# Patient Record
Sex: Male | Born: 1950 | Race: Black or African American | Hispanic: No | Marital: Married | Smoking: Former smoker
Health system: Southern US, Community
[De-identification: ages and names within clinical notes are randomized; demographics above are authoritative.]

## PROBLEM LIST (undated history)

## (undated) DIAGNOSIS — R42 Dizziness and giddiness: Secondary | ICD-10-CM

## (undated) DIAGNOSIS — I10 Essential (primary) hypertension: Secondary | ICD-10-CM

## (undated) DIAGNOSIS — E119 Type 2 diabetes mellitus without complications: Secondary | ICD-10-CM

## (undated) DIAGNOSIS — I452 Bifascicular block: Secondary | ICD-10-CM

## (undated) DIAGNOSIS — E785 Hyperlipidemia, unspecified: Secondary | ICD-10-CM

## (undated) HISTORY — DX: Type 2 diabetes mellitus without complications: E11.9

## (undated) HISTORY — DX: Dizziness and giddiness: R42

## (undated) HISTORY — PX: HERNIA REPAIR: SHX51

## (undated) HISTORY — PX: PROSTATE SURGERY: SHX751

## (undated) HISTORY — DX: Essential (primary) hypertension: I10

## (undated) HISTORY — PX: SHOULDER SURGERY: SHX246

## (undated) HISTORY — DX: Hyperlipidemia, unspecified: E78.5

## (undated) HISTORY — DX: Bifascicular block: I45.2

---

## 2004-05-19 ENCOUNTER — Encounter: Admission: RE | Admit: 2004-05-19 | Discharge: 2004-05-19 | Payer: Self-pay | Admitting: General Surgery

## 2004-05-19 IMAGING — CR DG CHEST 2V
2 series · 2 of 2 positions shown · non-contrast
Comparison: none

CLINICAL DATA: Pre-op for hernia repair. 
 TWO VIEW CHEST: 
 Comparison none. 
 Heart mildly enlarged with a prominent left ventricular contour.  No congestive heart failure or active disease.

[view not recorded (1 of 2)]
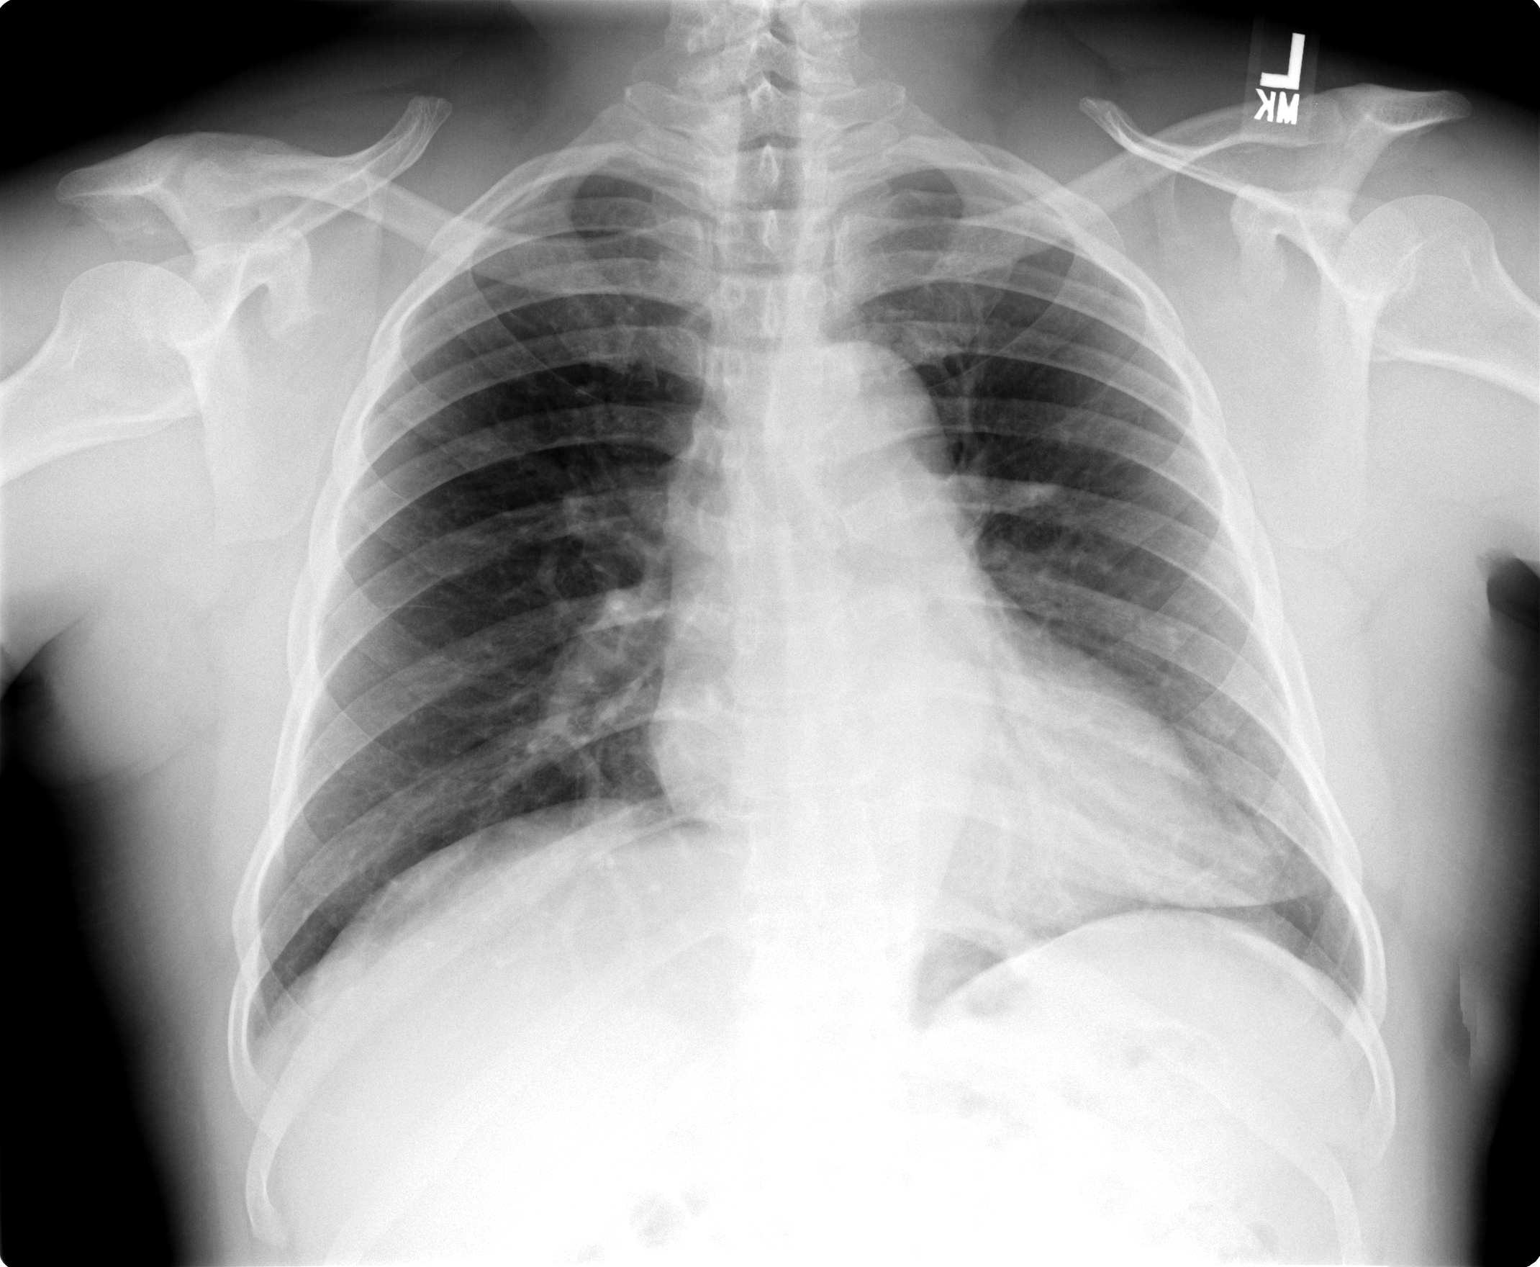

[view not recorded (2 of 2)]
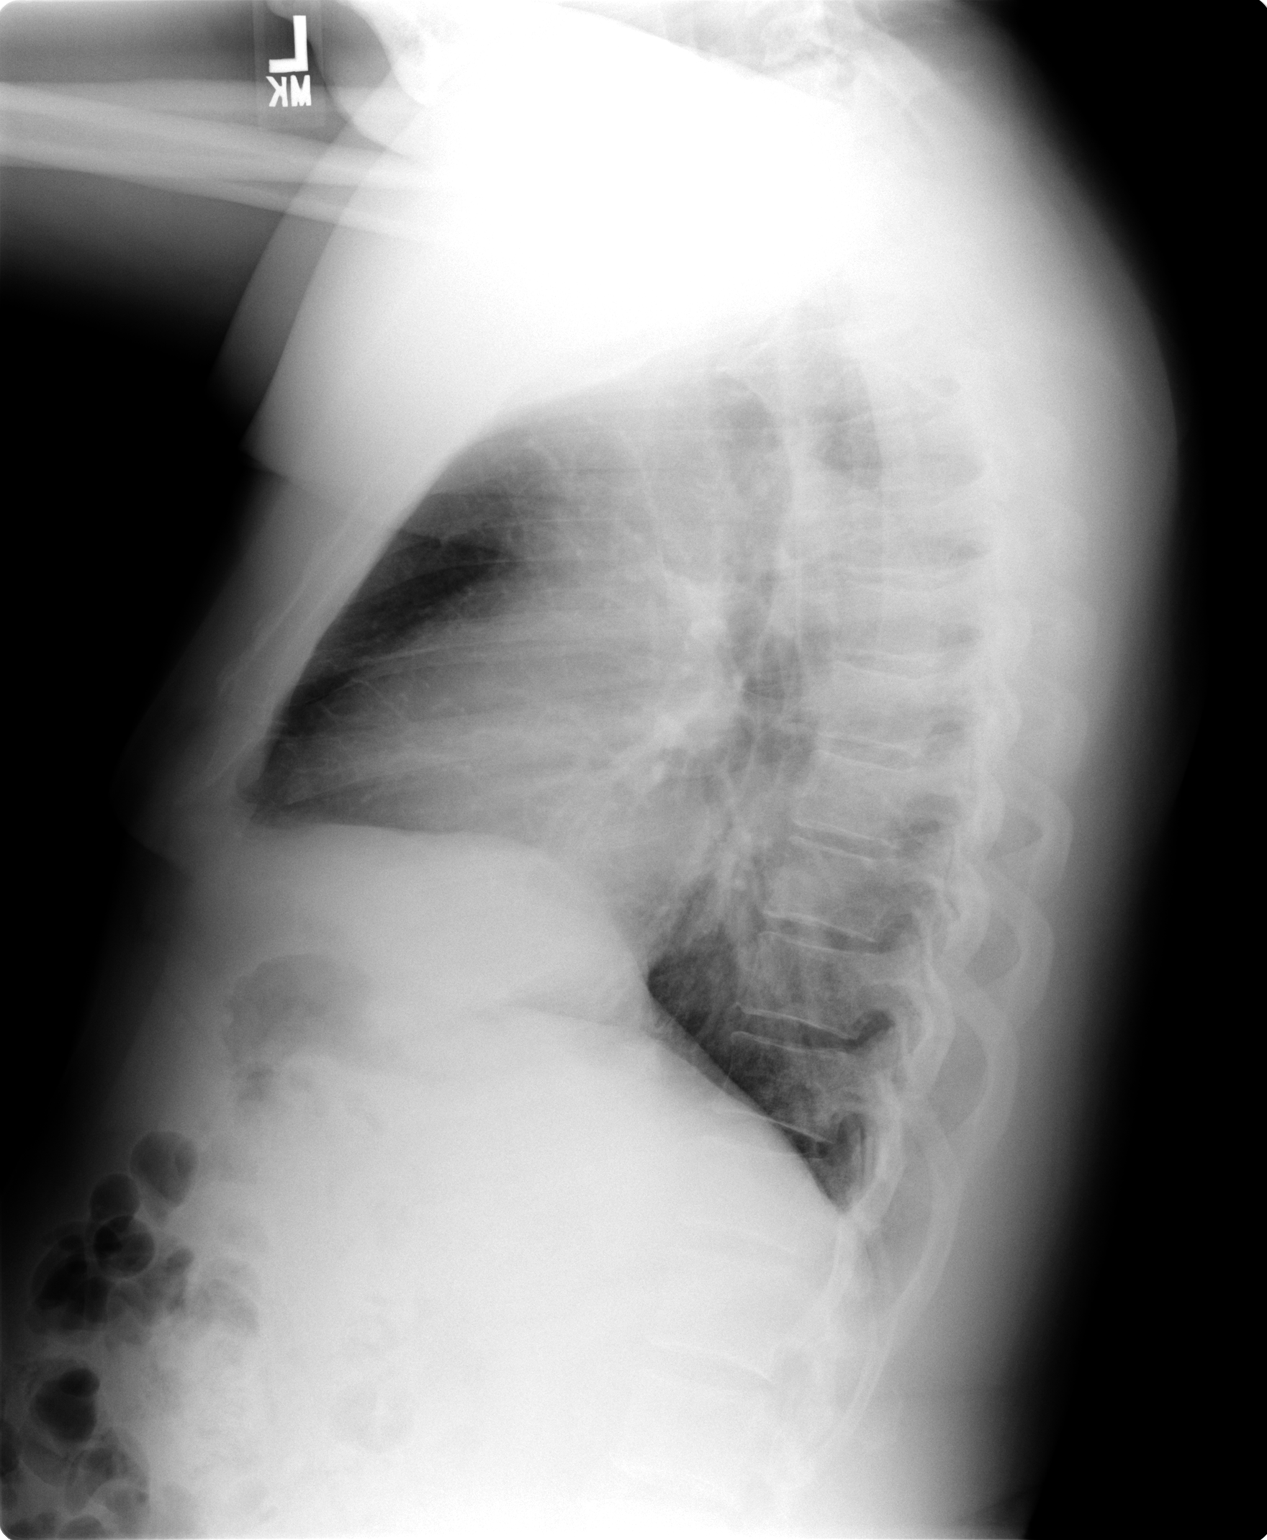

[2 of 2 positions shown; findings below may reference images not displayed]

IMPRESSION: Cardiomegaly ? no active disease.

## 2004-05-20 ENCOUNTER — Ambulatory Visit (HOSPITAL_COMMUNITY): Admission: RE | Admit: 2004-05-20 | Discharge: 2004-05-20 | Payer: Self-pay | Admitting: General Surgery

## 2004-05-20 ENCOUNTER — Ambulatory Visit (HOSPITAL_BASED_OUTPATIENT_CLINIC_OR_DEPARTMENT_OTHER): Admission: RE | Admit: 2004-05-20 | Discharge: 2004-05-20 | Payer: Self-pay | Admitting: General Surgery

## 2004-11-30 ENCOUNTER — Ambulatory Visit (HOSPITAL_COMMUNITY): Admission: RE | Admit: 2004-11-30 | Discharge: 2004-11-30 | Payer: Self-pay | Admitting: Family Medicine

## 2004-11-30 IMAGING — US US SCROTUM
1 series · 14 of 25 positions shown · non-contrast
Comparison: None.

CLINICAL DATA: Right-sided testicular enlargement and pain.  Inguinal hernia repair 4 months ago.
 SCROTAL ULTRASOUND/DOPPLER ULTRASOUND OF THE TESTICLES:
TECHNIQUE: Complete ultrasound examination of the testicles, epididymis, and other scrotal structures was performed. Color and duplex Doppler ultrasound was utilized to evaluate blood flow to the testicles.

[Series 1: unknown · 0.09mm/px · 14 of 66 slices shown]
[im 1/66]
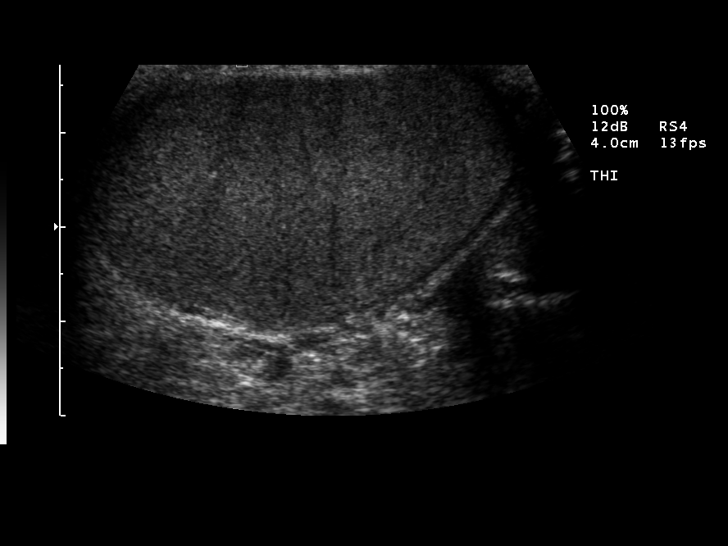
[im 6/66]
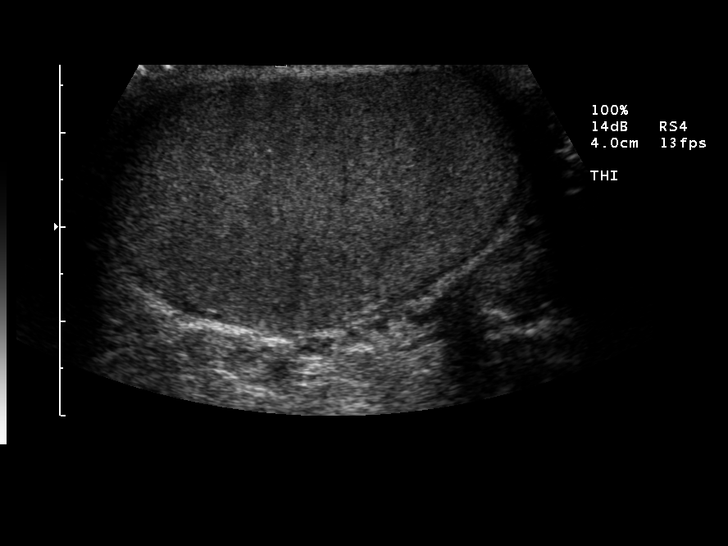
[im 11/66]
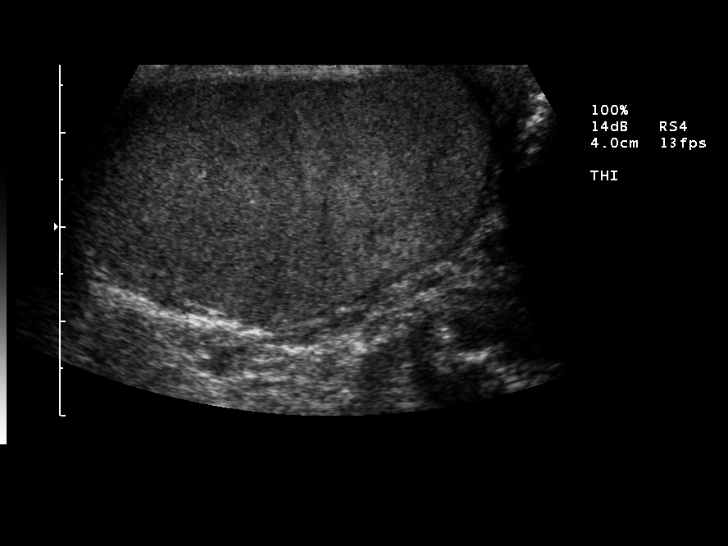
[im 17/66]
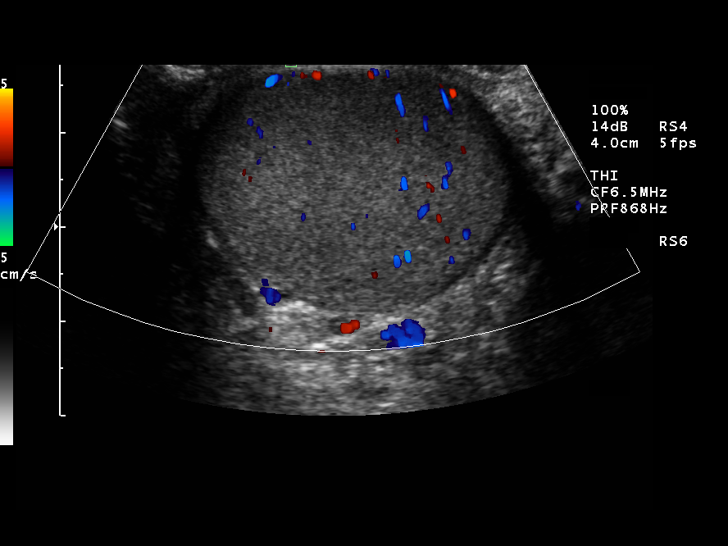
[im 22/66]
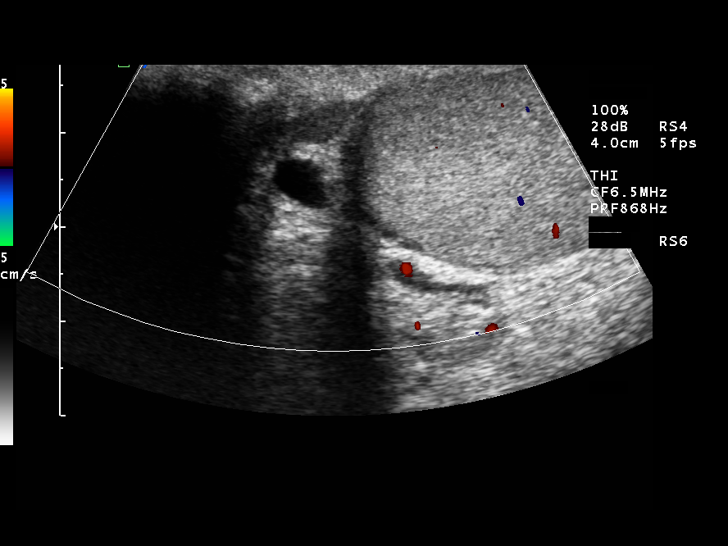
[im 25/66]
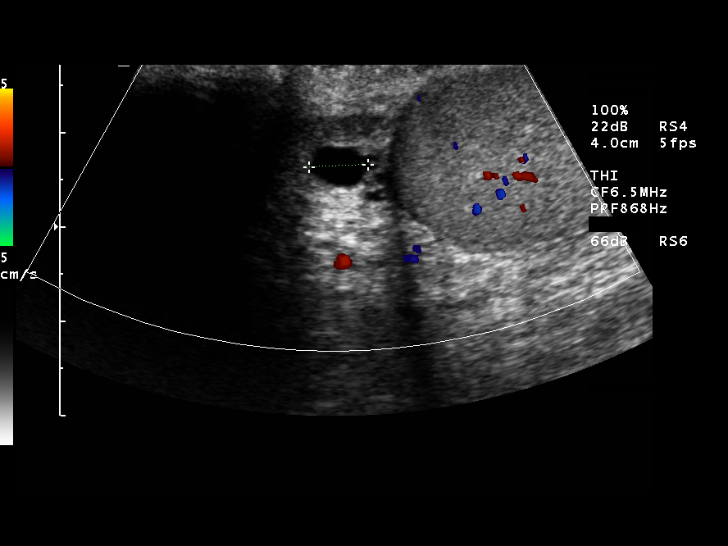
[im 30/66]
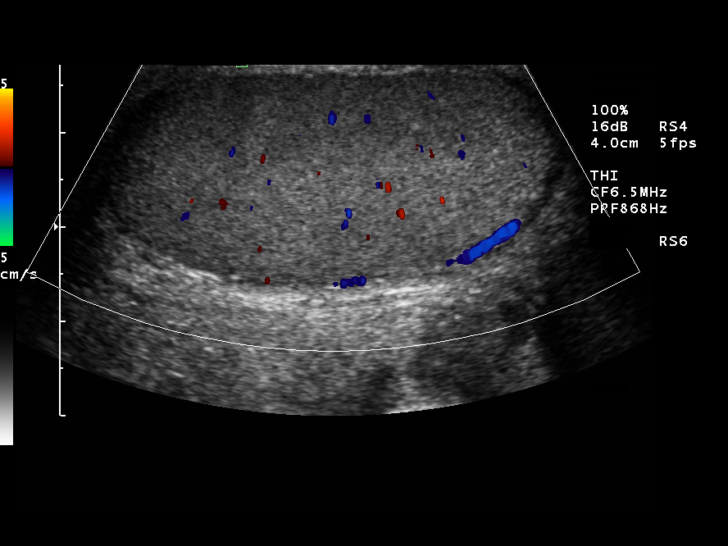
[im 36/66]
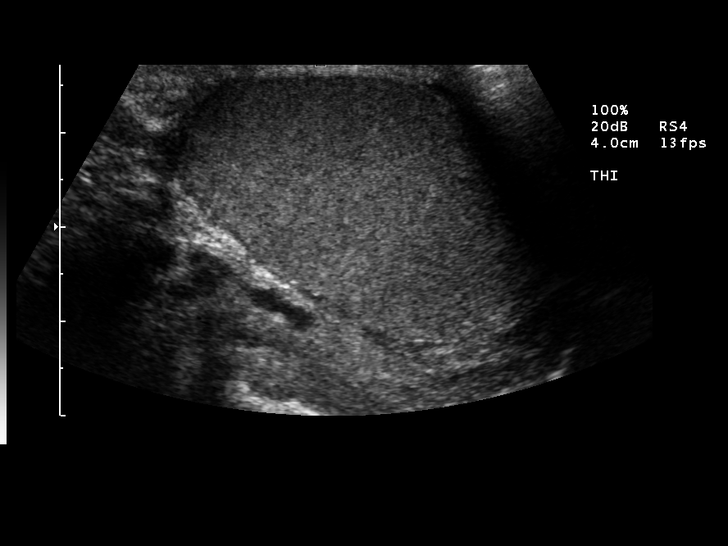
[im 41/66]
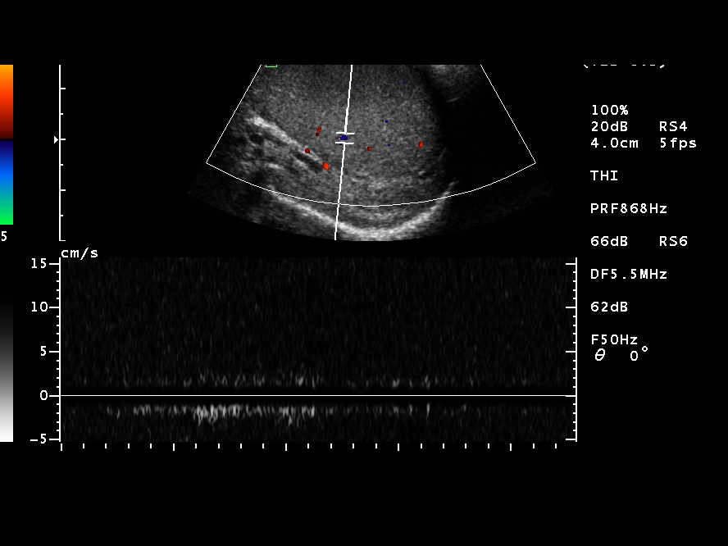
[im 44/66]
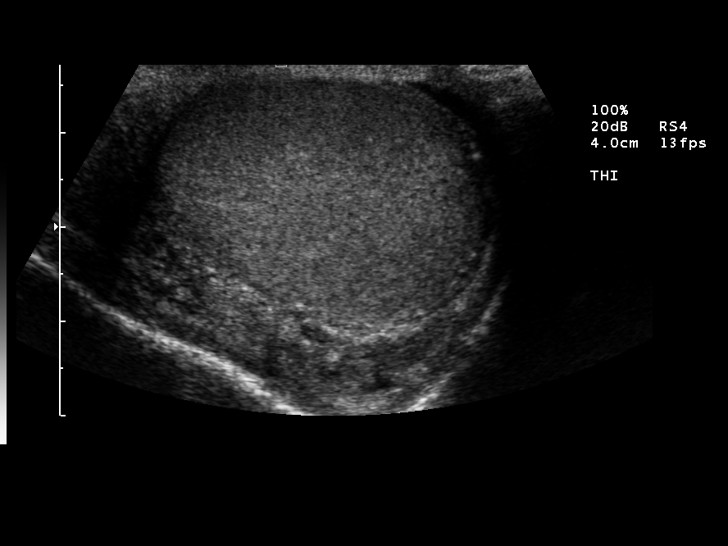
[im 49/66]
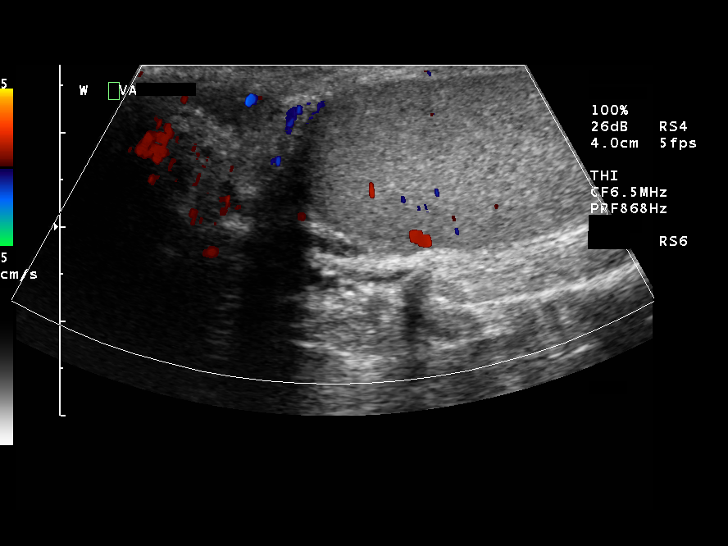
[im 55/66]
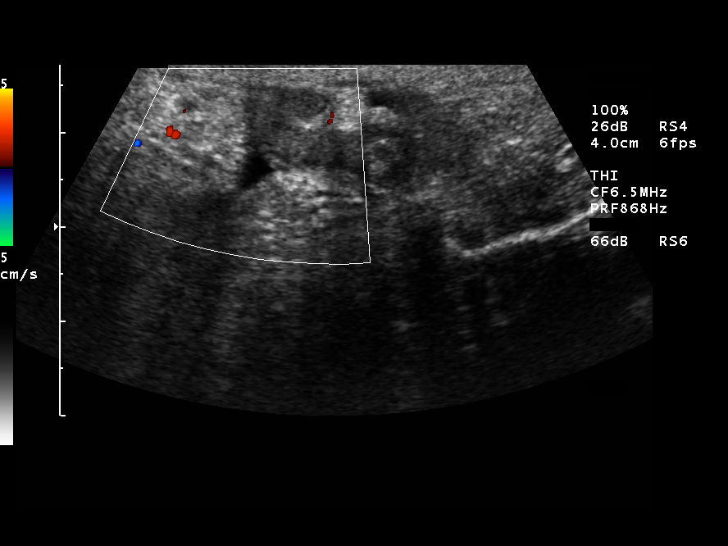
[im 60/66]
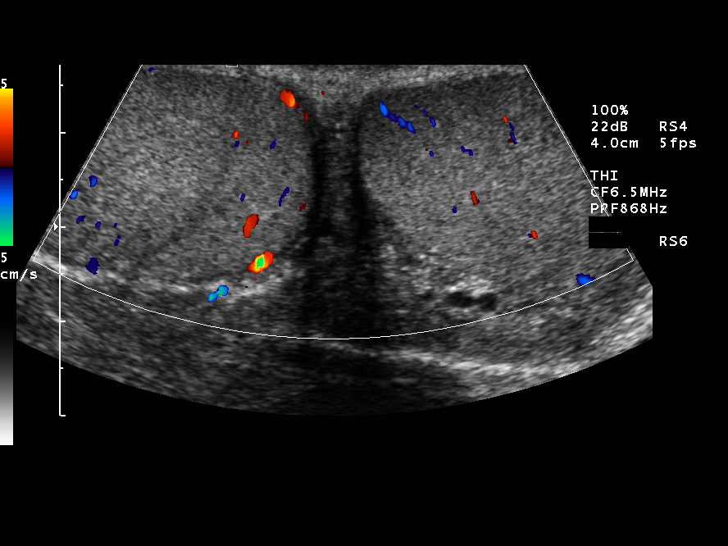
[im 66/66]
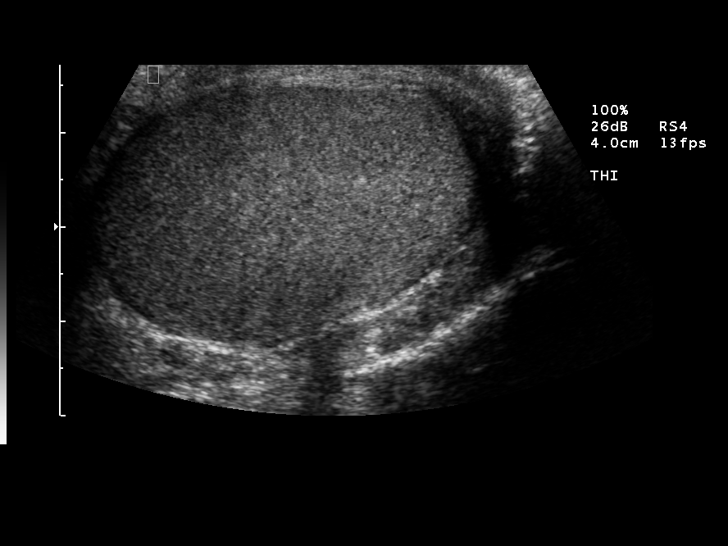

[14 of 25 positions shown; findings below may reference images not displayed]

FINDINGS: Left testis measures 4.6 x 2.7 x 3.3 cm.  Normal arterial and venous flow is demonstrated.  Right epididymis measures 1.6 x 0.9 x 1.1 cm. two epididymal cysts vs spermatoceles are identified on the right measuring approximately 5 mm each.
 Left testis measures 4.1 x 2.5 x 3.5 cm.  Trace left-sided hydrocele.  
 Left epididymis measures 1.3 x 1.0 x 1.0 cm.  Arterial and venous flow is seen to the left testis.
IMPRESSION: 1.  Two small right-sided epididymal cysts vs spermatoceles.
 2.  Otherwise normal scrotal ultrasound as described.

## 2005-12-18 ENCOUNTER — Encounter: Admission: RE | Admit: 2005-12-18 | Discharge: 2005-12-18 | Payer: Self-pay | Admitting: General Surgery

## 2005-12-18 IMAGING — CR DG CHEST 2V
2 series · 2 of 2 positions shown · non-contrast
Comparison: 05/19/04.

CLINICAL DATA: Pre-op work up for bilateral breast surgery for gynecomastia.  Hypertension. 
 CHEST, TWO VIEWS:

[w chest pa *]
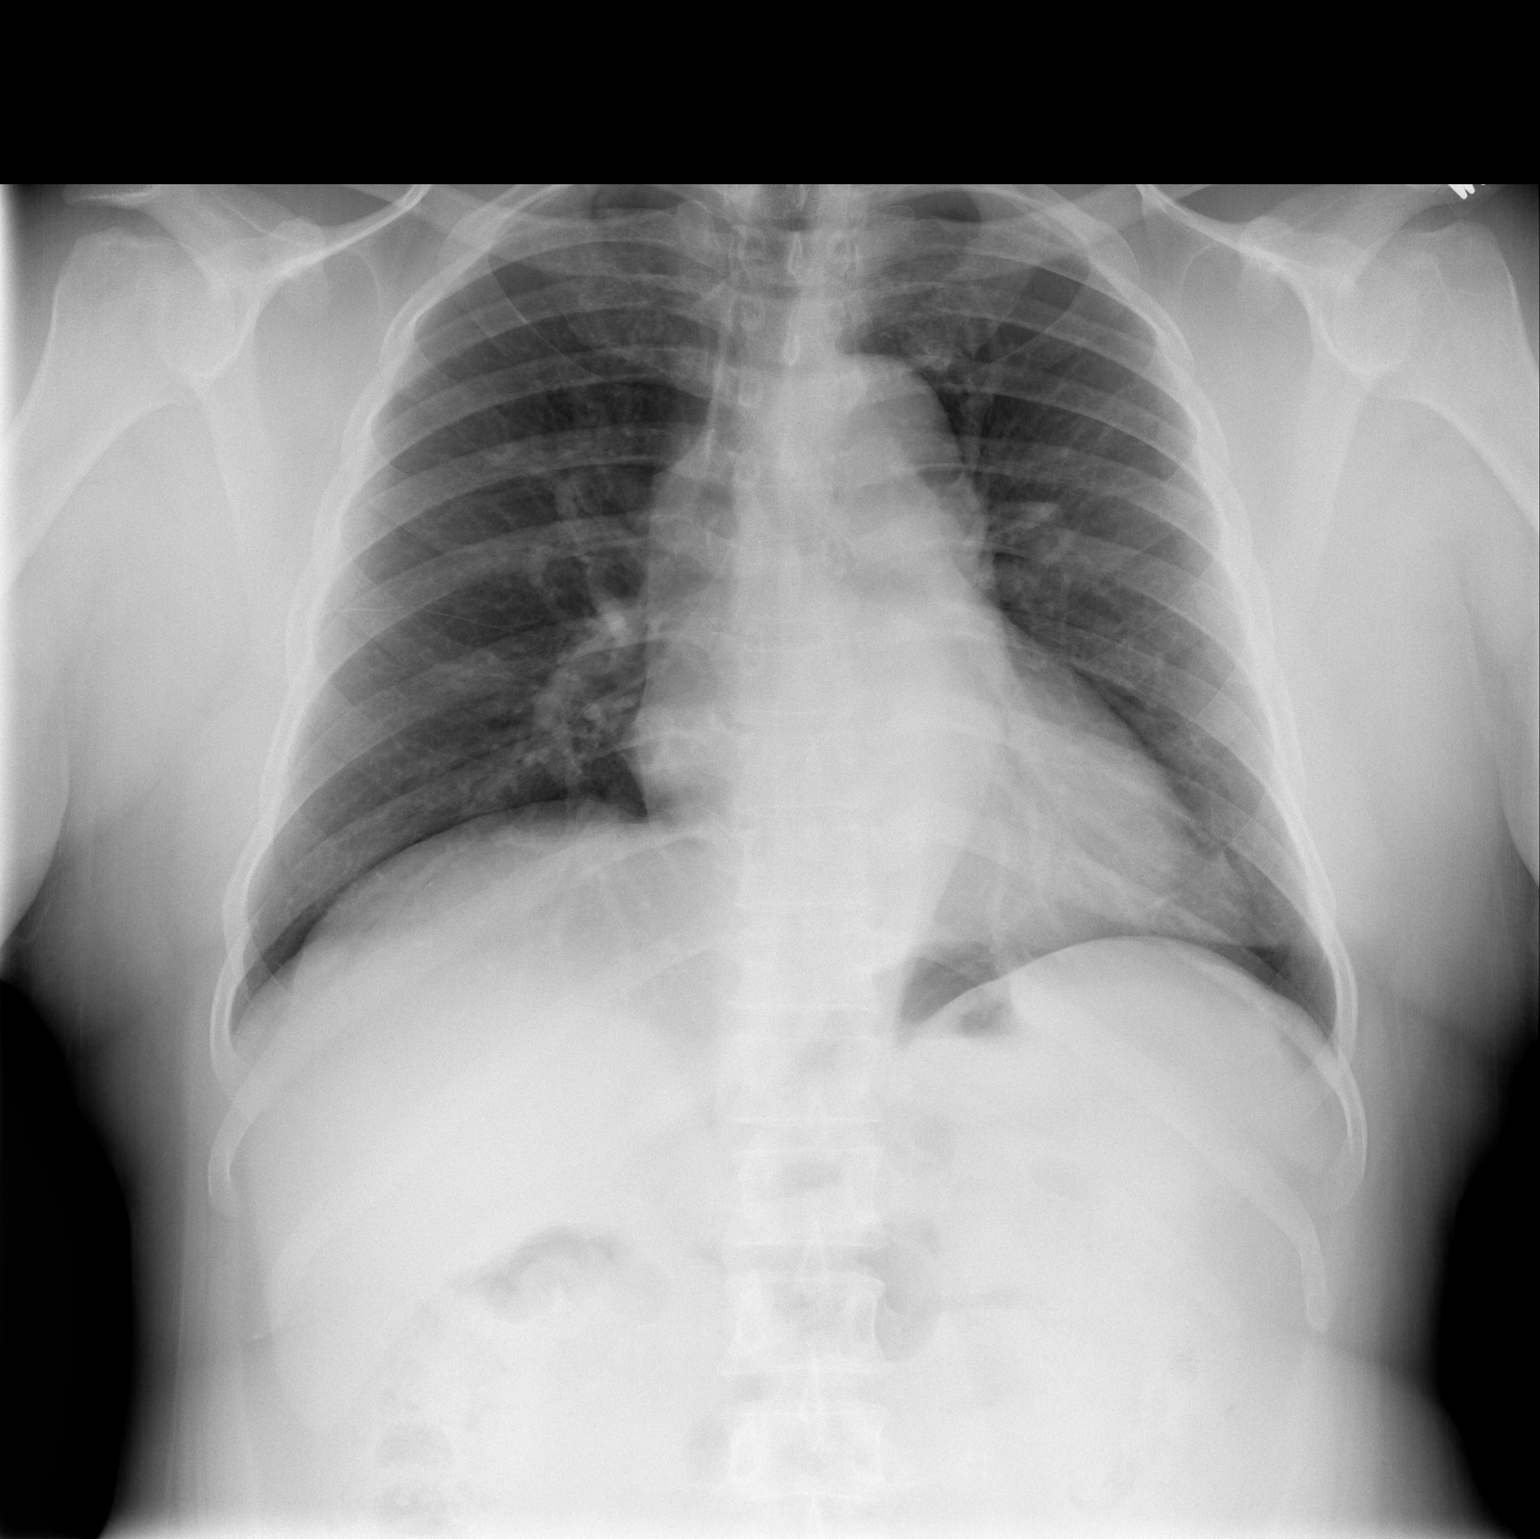

[w chest lat *]
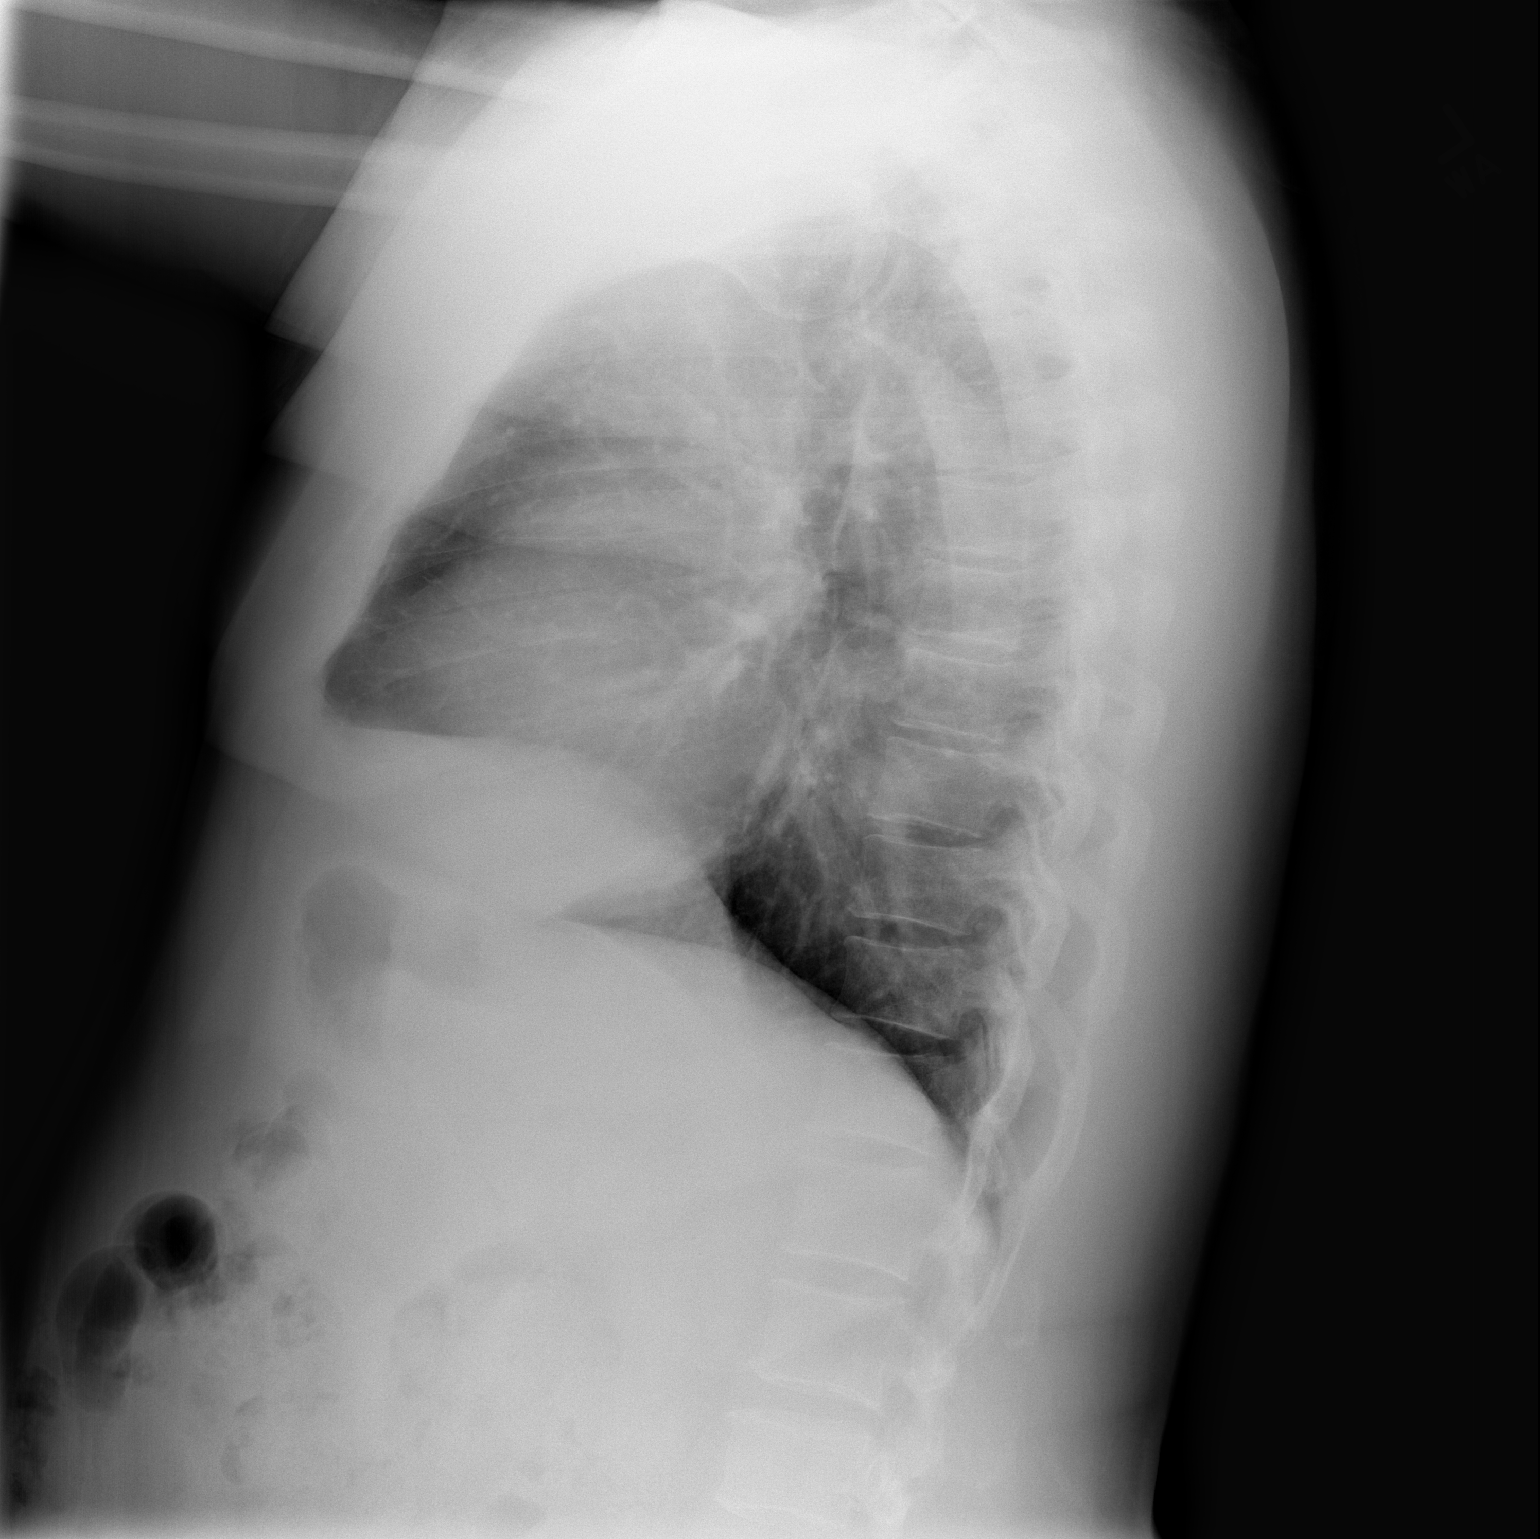

[2 of 2 positions shown; findings below may reference images not displayed]

Again appreciated is mild cardiomegaly and diffuse tortuosity and ectasia of the thoracic aorta.  No pulmonary vascular congestion or active lung process.  Intact bony thorax.
IMPRESSION: Mild cardiomegaly.  No interval change.

## 2005-12-20 ENCOUNTER — Ambulatory Visit (HOSPITAL_BASED_OUTPATIENT_CLINIC_OR_DEPARTMENT_OTHER): Admission: RE | Admit: 2005-12-20 | Discharge: 2005-12-20 | Payer: Self-pay | Admitting: General Surgery

## 2005-12-20 ENCOUNTER — Encounter (INDEPENDENT_AMBULATORY_CARE_PROVIDER_SITE_OTHER): Payer: Self-pay | Admitting: Specialist

## 2008-01-04 ENCOUNTER — Emergency Department (HOSPITAL_COMMUNITY): Admission: EM | Admit: 2008-01-04 | Discharge: 2008-01-04 | Payer: Self-pay | Admitting: Emergency Medicine

## 2008-01-04 IMAGING — CR DG CHEST 2V
3 series · 3 of 3 positions shown · non-contrast
Comparison: 12/18/2005, 05/19/2004.

CLINICAL DATA: 57-year-old male with chest pain.  No cough or
fever.

CHEST - 2 VIEW

[view not recorded (1 of 3)]
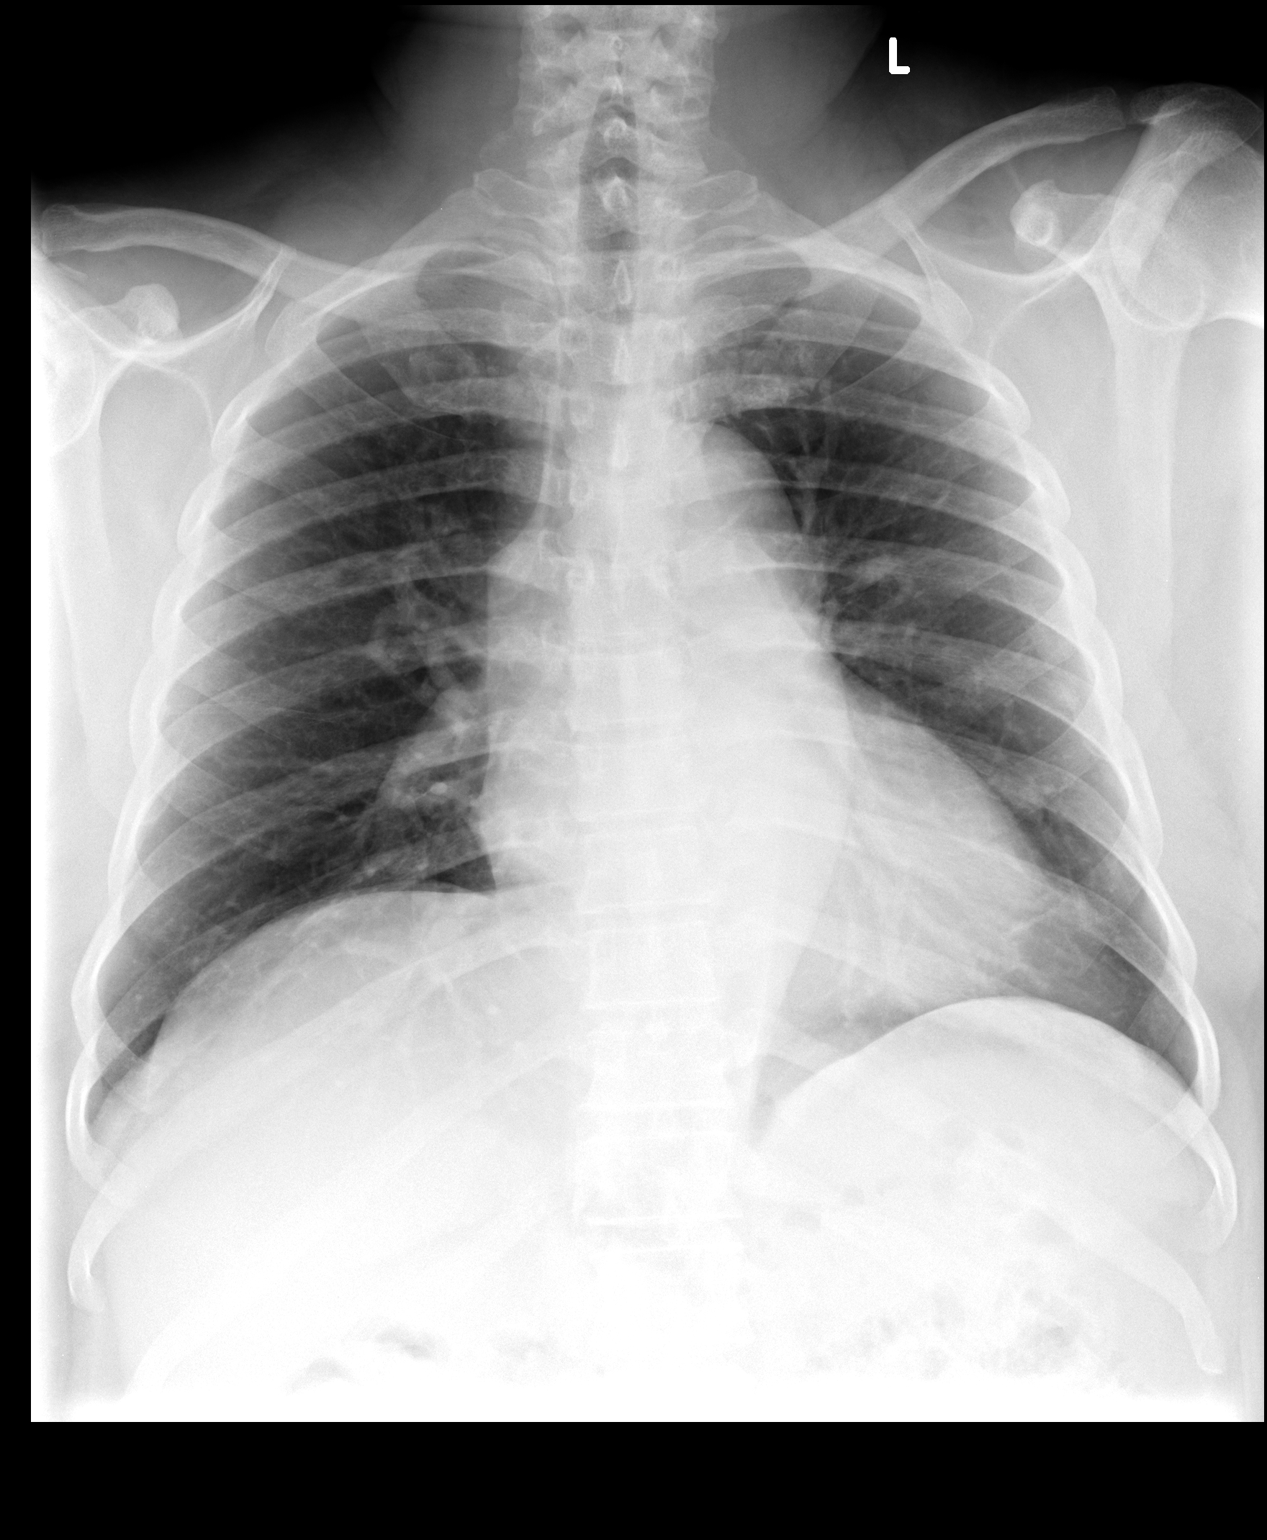

[view not recorded (2 of 3)]
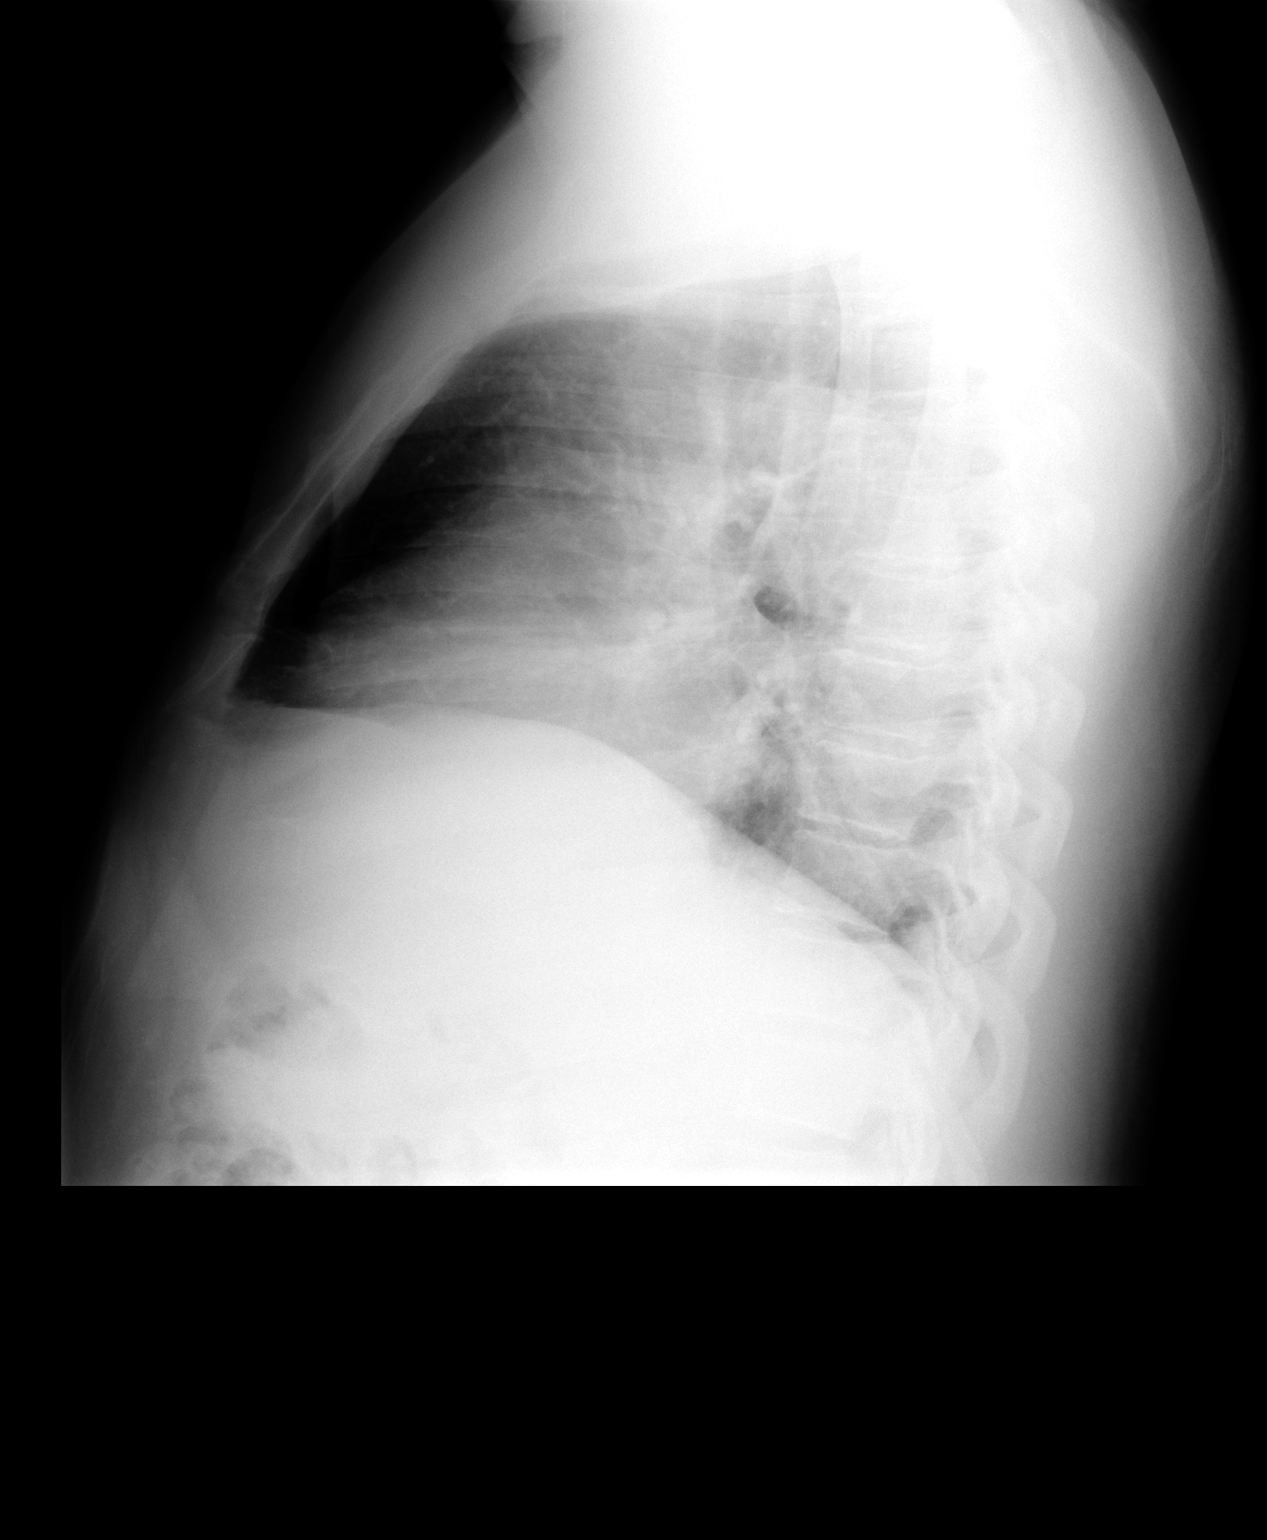

[view not recorded (3 of 3)]
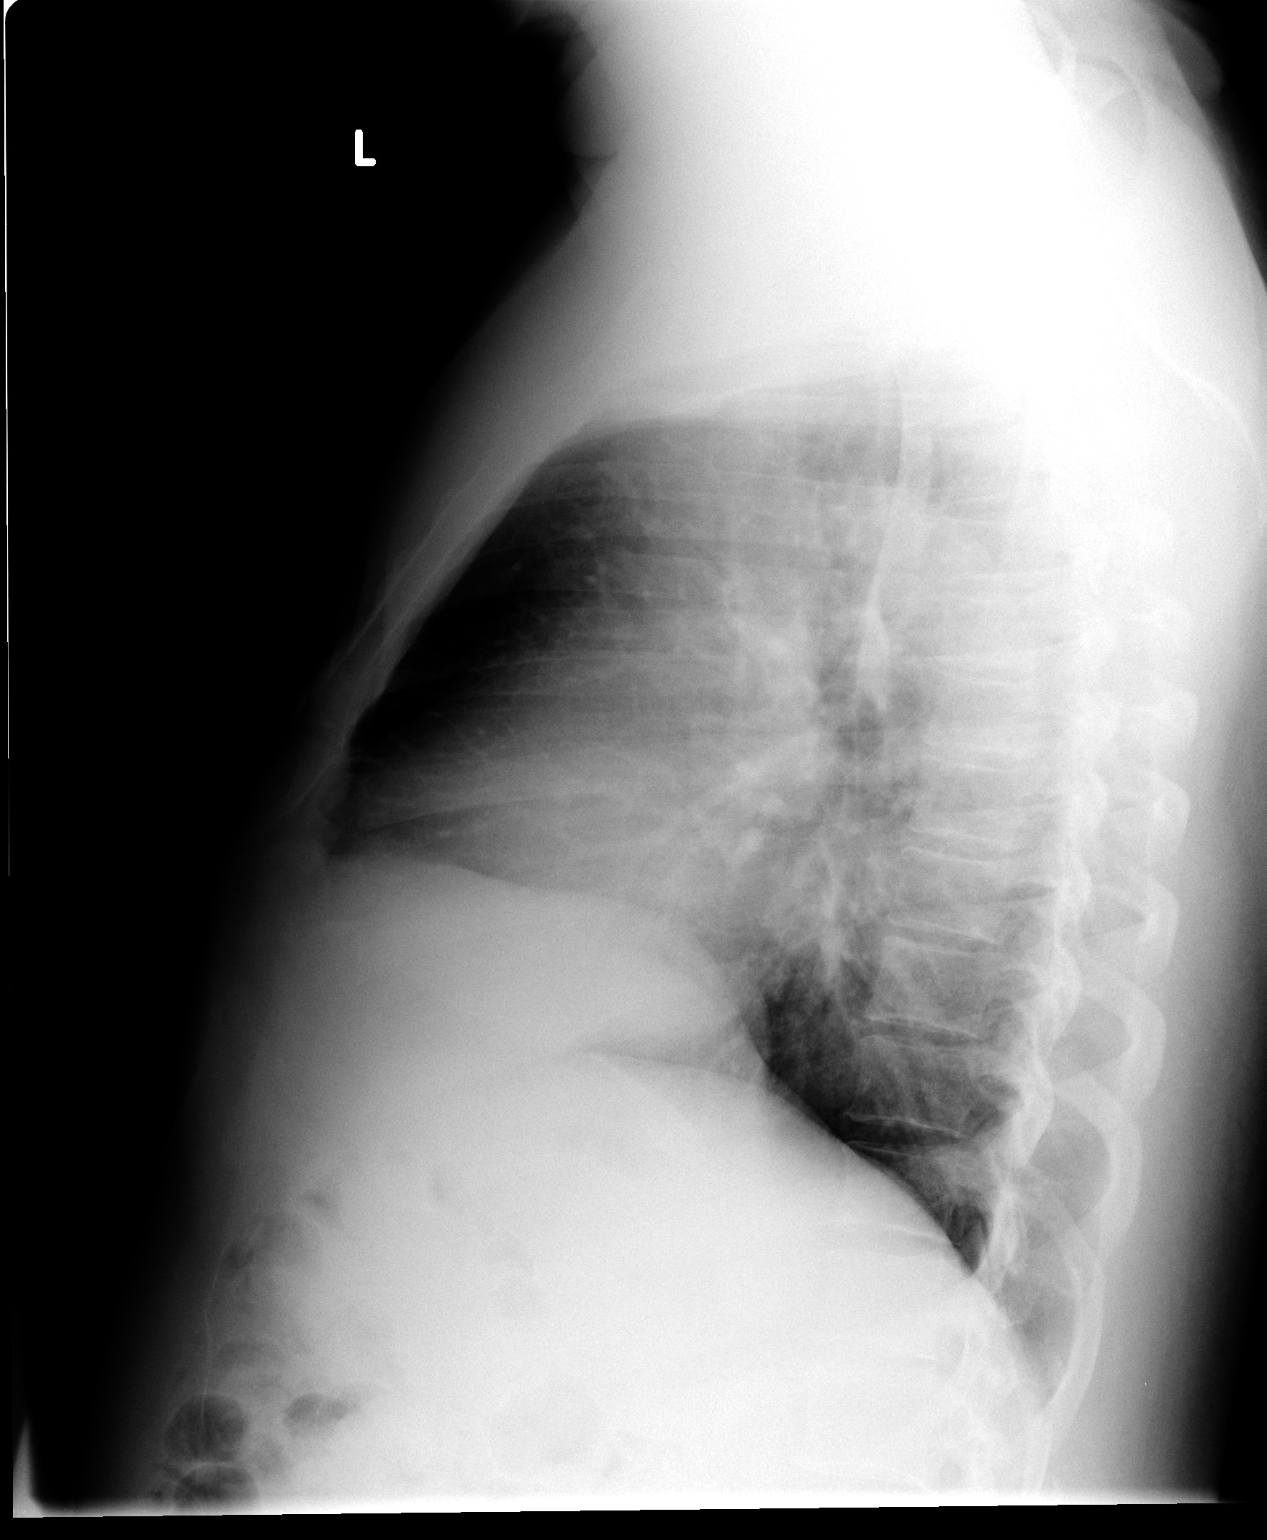

[3 of 3 positions shown; findings below may reference images not displayed]

FINDINGS: Stable tortuous descending thoracic aorta.  Cardiac size
is at the upper limits of normal.  No pneumothorax, pulmonary
edema, pleural effusion or confluent airspace opacity. Stable
visualized osseous structures.
IMPRESSION: No acute cardiopulmonary abnormality.

## 2008-01-16 ENCOUNTER — Ambulatory Visit (HOSPITAL_COMMUNITY): Admission: RE | Admit: 2008-01-16 | Discharge: 2008-01-16 | Payer: Self-pay | Admitting: Cardiology

## 2008-01-16 IMAGING — CT CT HEART WO/W CTA ONLY W/ CA
1 of 3 series · 13 of 20 positions shown, 17 images · IV contrast (APPLIED)
Comparison: none

Addendum Begins

CARDIAC CTA WITH CALCIUM SCORE 01/16/2008 [DATE]
Ordering Physician: [REDACTED]
Reading Physician: Reiko.Tiger, Jevan., Reni Botello, M.D.
PROTOCOL: The patient scanned on a Siemens sensations 64 slice
scanner.  Gantry rotation speed was 320 milliseconds.  Collimation
was [DATE] mm . Reconstruction overlap was [DATE] mm.   5
mg of Lopressor was administered.  Average heart rate during the
scan was 60 beats per minute.  After an initial AP and lateral
topogram, 3 mm axial slices were performed through the heart for
calcium scoring.  The patient then received 20 ml of contrast for a
timing bolus with a region of interest in the ascending aorta.  A
delay of 20 seconds was used.  The patient then had a 80 ml of
contrast given for coronary CTA.  The 3-D data set was then sent to
the [employer] workstation.  Reconstructions were done using
MIP,MPR and VRT modes.

[Series 10: 70% only · axial · 0.42mm/px · z∈[-210,-123]mm · 13 of 253 slices shown, 17 images]
[im 19/253  vessel]
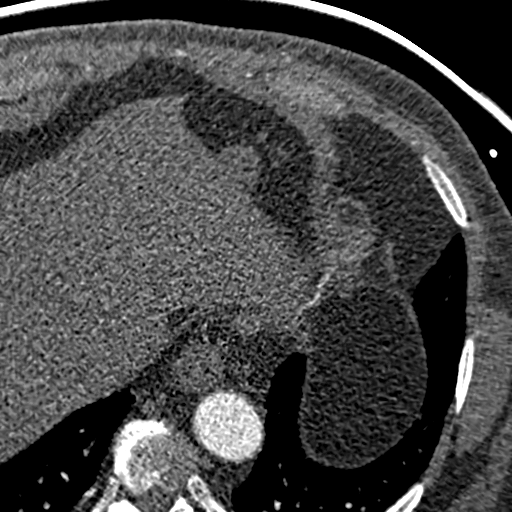
[im 19/253  lung]
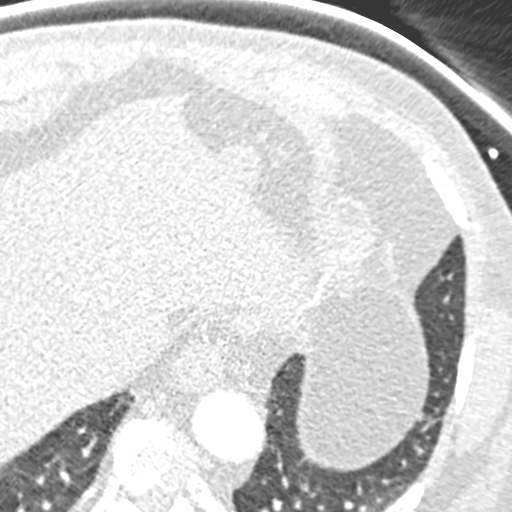
[im 37/253  vessel]
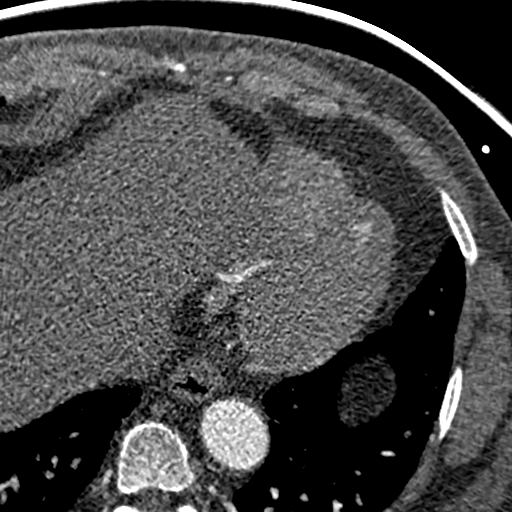
[im 55/253  vessel]
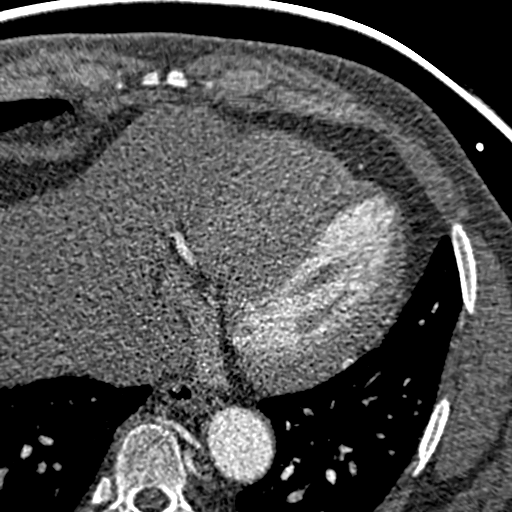
[im 73/253  vessel]
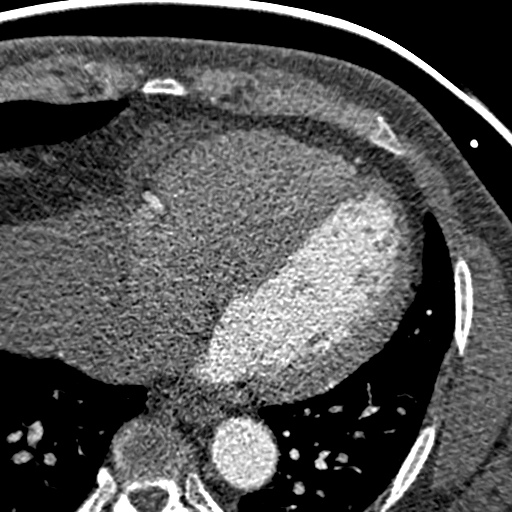
[im 91/253  vessel]
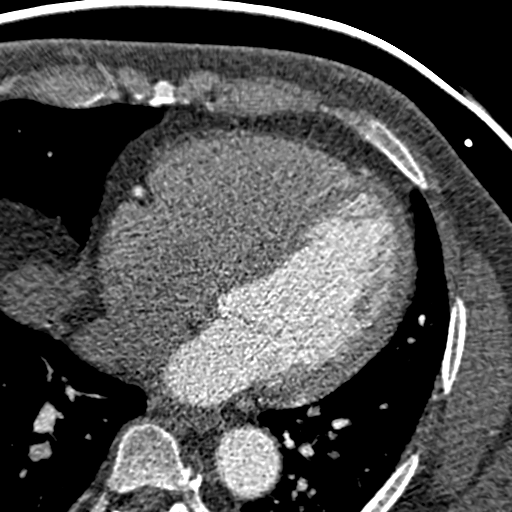
[im 91/253  lung]
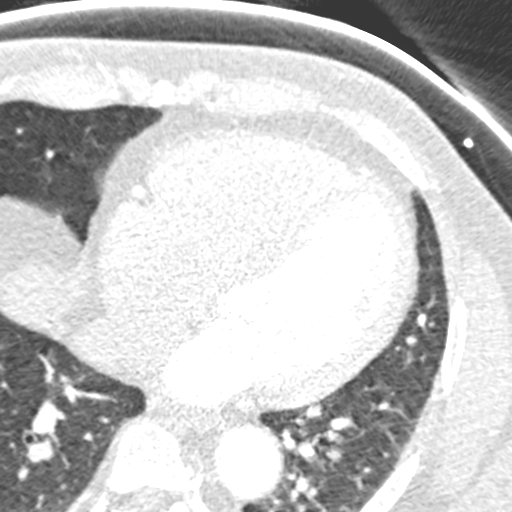
[im 109/253  vessel]
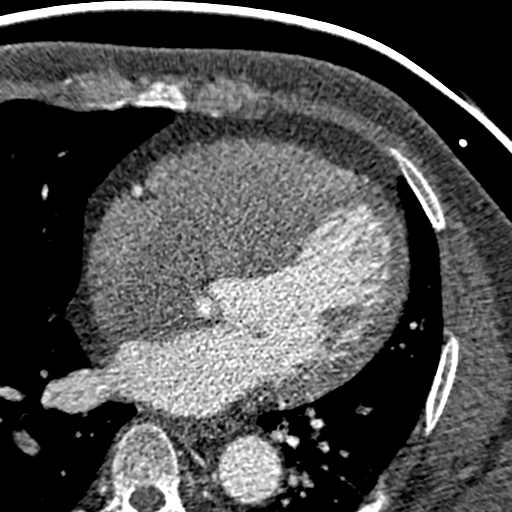
[im 127/253  vessel]
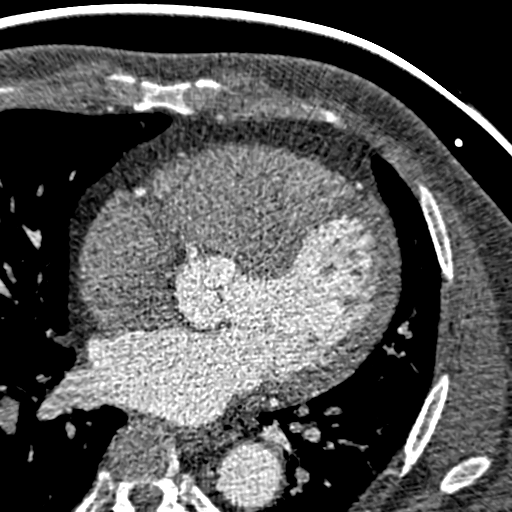
[im 145/253  vessel]
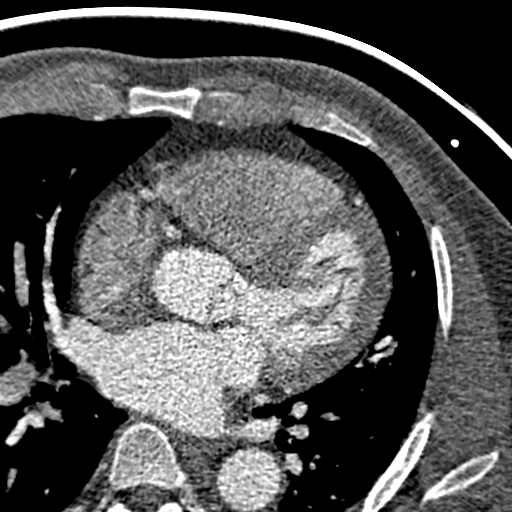
[im 163/253  vessel]
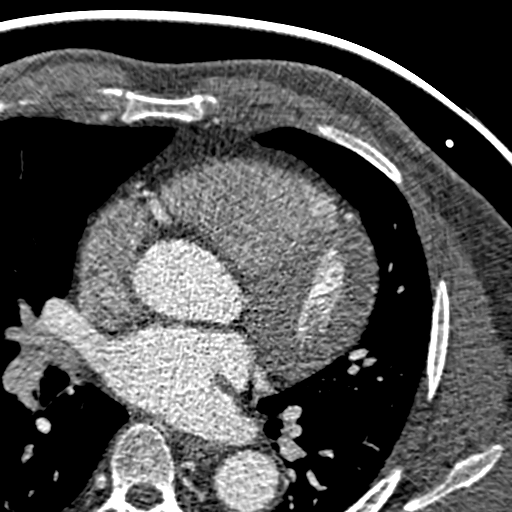
[im 163/253  lung]
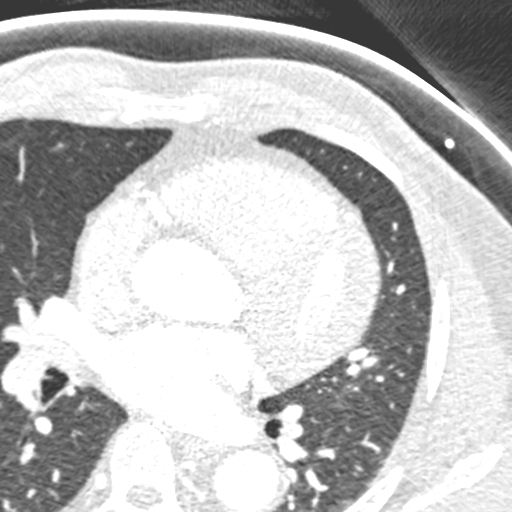
[im 181/253  vessel]
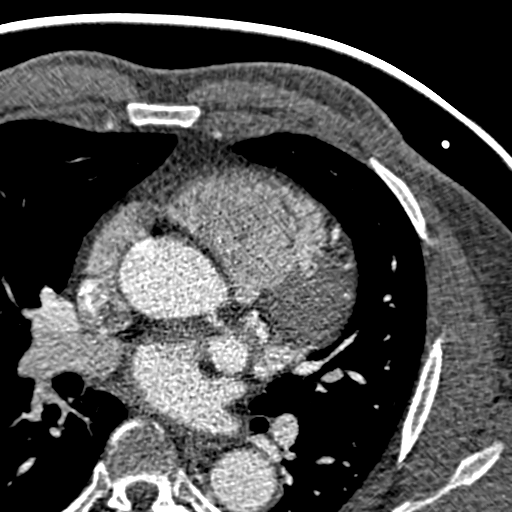
[im 199/253  vessel]
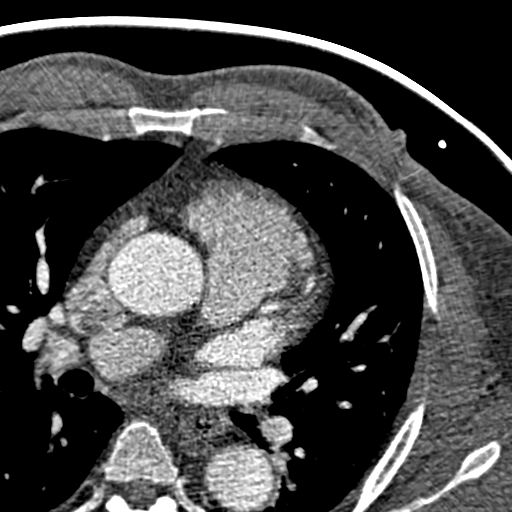
[im 217/253  vessel]
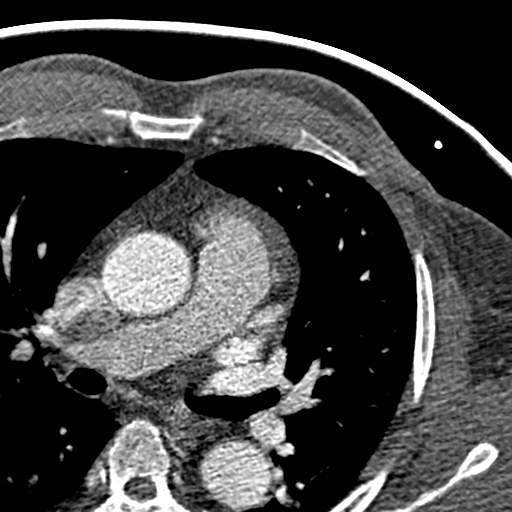
[im 235/253  vessel]
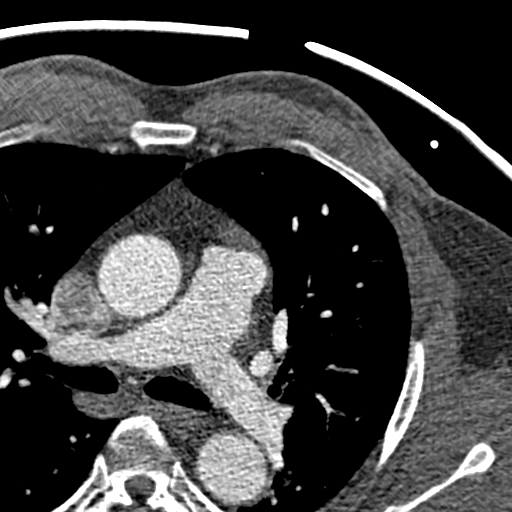
[im 235/253  lung]
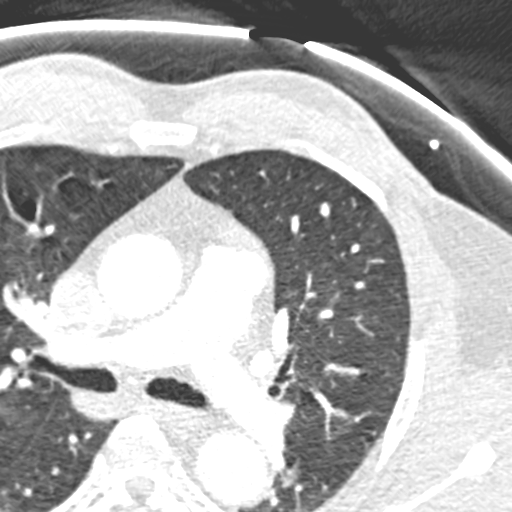

[13 of 20 positions shown; findings below may reference images not displayed]

Indications: Chest pain

DETAILED FINDINGS:

Quality of Study: Excellent

Left Main: Large caliber, no angiographically significant coronary
artery disease, branches into left anterior descending artery and
circumflex artery.

Left Anterior Descending: This is a large caliber vessel giving
rise to two diagonal branches.  At the first bifurcation there is
evidence of mild soft plaque without any calcification.  Otherwise,
the artery continues to wrap around the apex.

Left Circumflex: There are two large obtuse marginal branches.
There is no significant coronary artery plaquing noted.

Right Coronary Artery: This is the dominant artery giving rise to
the posterior descending artery.  There is mild plaquing in the
proximal segment of the right coronary artery otherwise no
angiographically significant coronary artery disease.

Ventricular Function/Wall Motion: There are no wall motion
abnormalities noted. There is mild left ventricular hypertrophy
with the posterior wall measuring 12 mm, septal wall measuring 9
mm.

LV Ejection Fraction: The ejection fraction is estimated at 60-65%.

Left Atrium, Right Atrium, RV Size: The left atrium is mildly
dilated measuring 45 mm both right ventricular and right atrial
size appears normal.

Pericardium: There is no evidence of pericardial effusion.

Coronary Calcium Score: There is no significant coronary artery
calcium detected.  His coronary artery calcium score was calculated
as 4.

Aorta: The aorta appears normal in size measuring 33 mm at the
ascending portion.  His aortic valve is trileaflet and opens
properly.  There is no evidence of aortic dissection in the area
visualized.

Other: Noncardiac portions of read by radiologist.
IMPRESSION: 1. Minor soft plaque noted in the LAD and right coronary artery
with no angiographically significant coronary artery disease.
2. Normal left ventricular wall motion with ejection fraction
estimated at 60-65%.
3. There is no evidence of aortic dissection in the portion of the
aorta visualized.
4.  Mild left atrial enlargement.
5.  Mild left ventricular hypertrophy.

Addendum Ends
OVER-READ INTERPRETATION - CT CHEST

The following report is an over-read performed by radiologist Dr.
Elier, M.D. of [REDACTED] , [place] on 01/17/2008 [DATE].
This over read does not include interpretation of cardiac or
coronary anatomy or pathology.  The CTA interpretation by the
Cardiologist is attached.
FINDINGS: There is no significant pericardial or pleural fluid on
the submitted images.  Limited evaluation for pulmonary emboli on
this examination.  The visualized lungs are clear.  On series #8,
image 6, there is a focal area sclerosis involving a right
posterior rib.  This area sclerosis is indeterminate.

IMPRESSION
Negative overread examination.

Small area of sclerosis involving a right posterior rib as
described.  This is likely an incidental finding unless the patient
is at risk for bone lesions.

## 2008-10-21 ENCOUNTER — Encounter: Admission: RE | Admit: 2008-10-21 | Discharge: 2008-10-21 | Payer: Self-pay | Admitting: Occupational Medicine

## 2008-10-21 IMAGING — CR DG ANKLE COMPLETE 3+V*R*
3 series · 3 of 3 positions shown · non-contrast
Comparison: The none

CLINICAL DATA: Twisted ankle yesterday with pain

RIGHT ANKLE - COMPLETE 3+ VIEW

[view not recorded (1 of 3)]
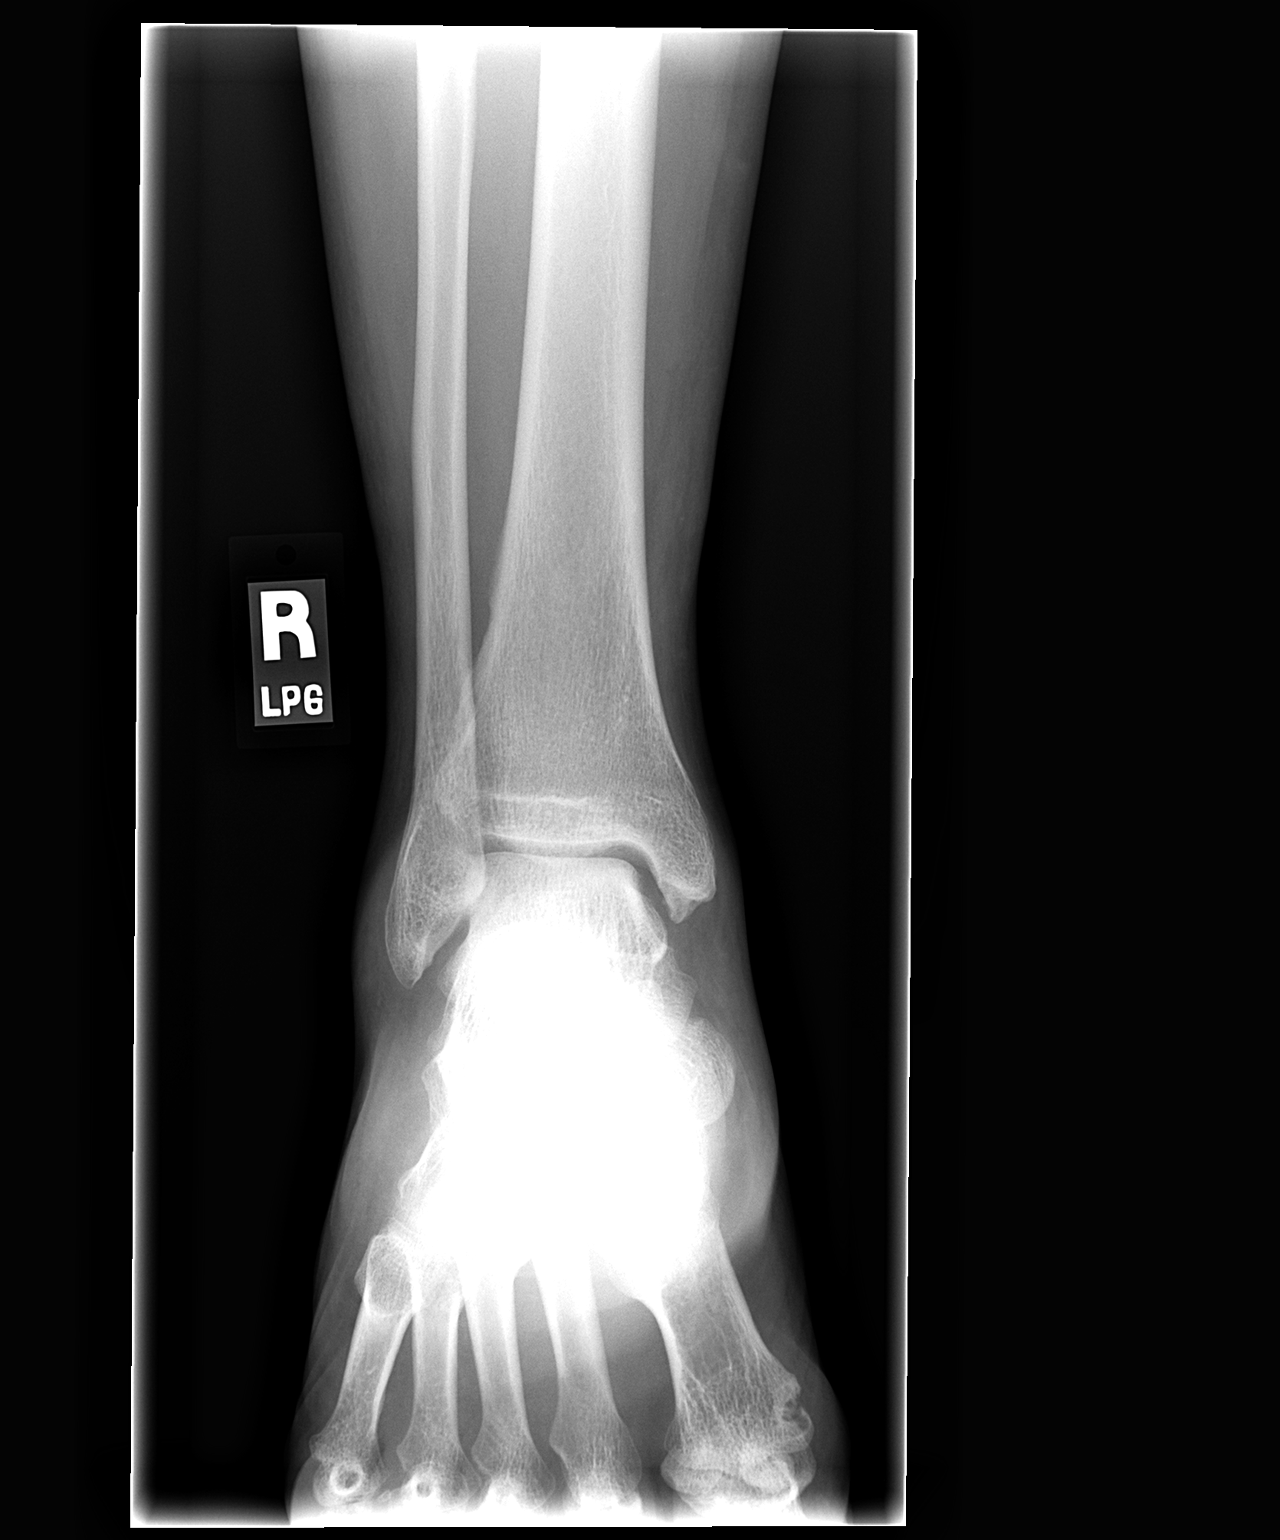

[view not recorded (2 of 3)]
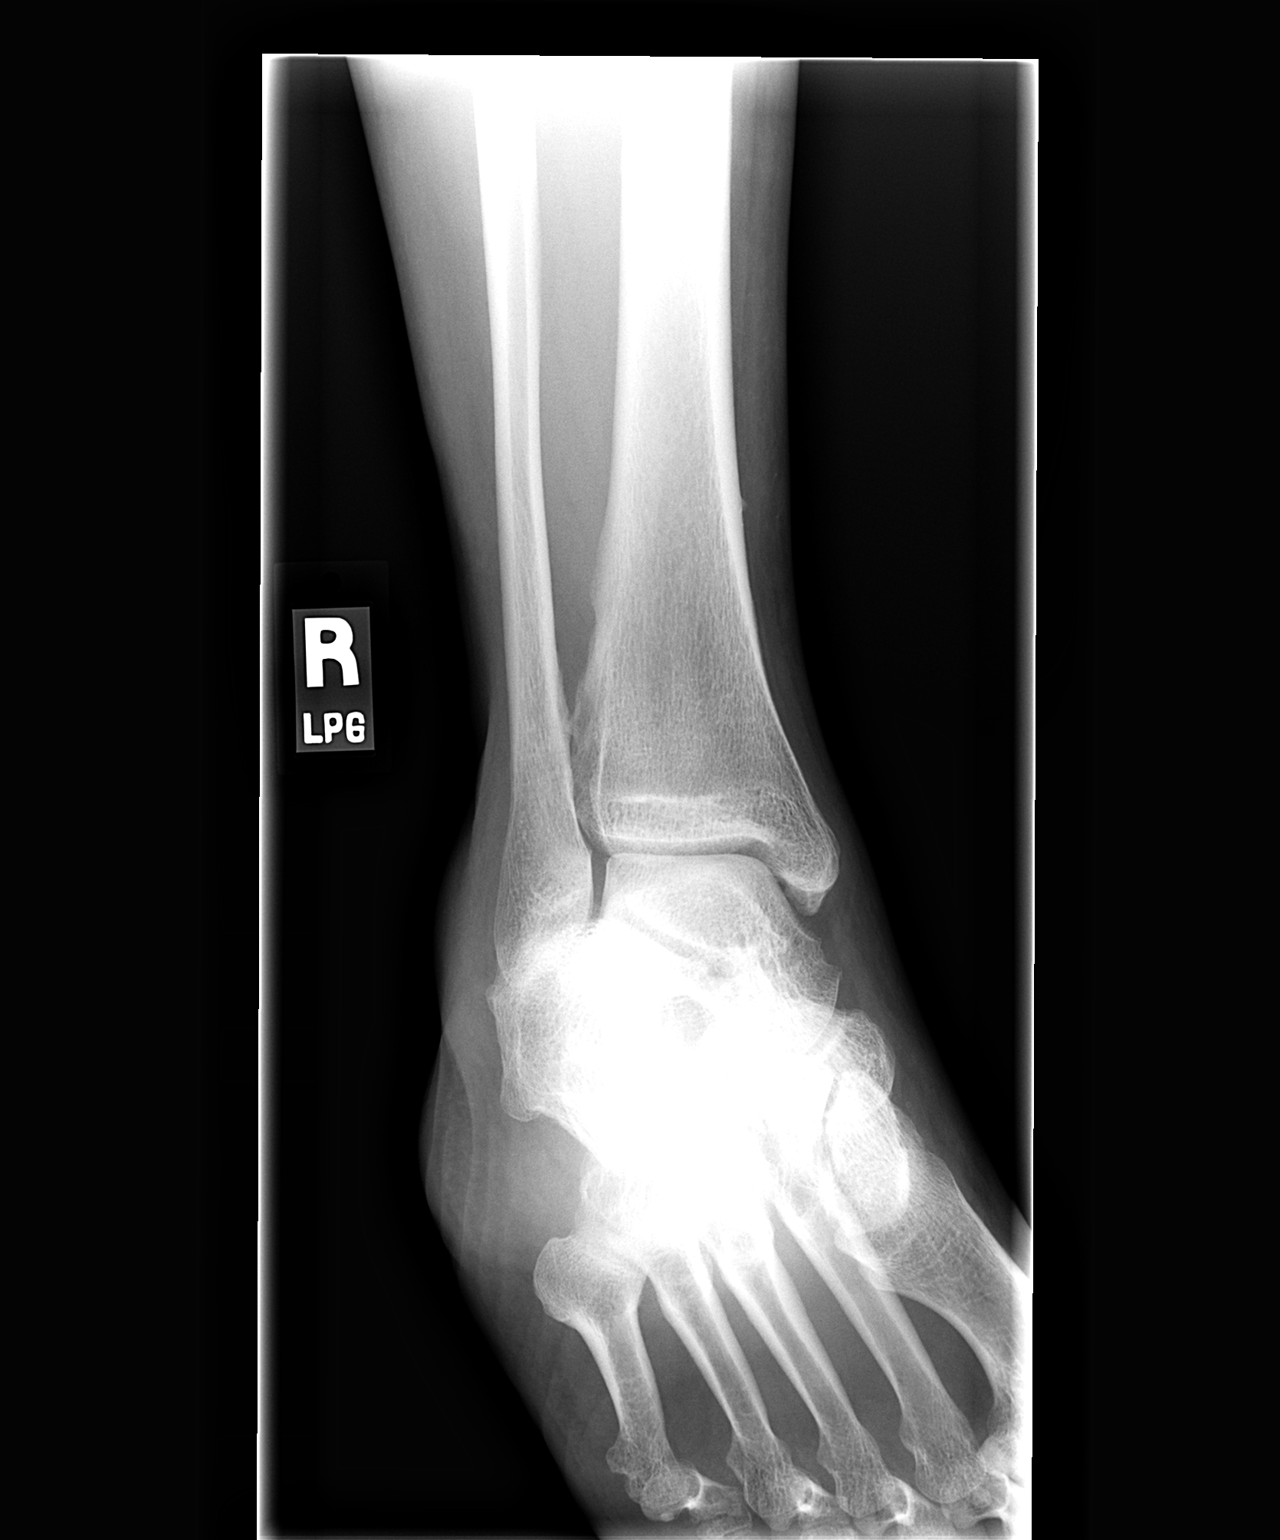

[view not recorded (3 of 3)]
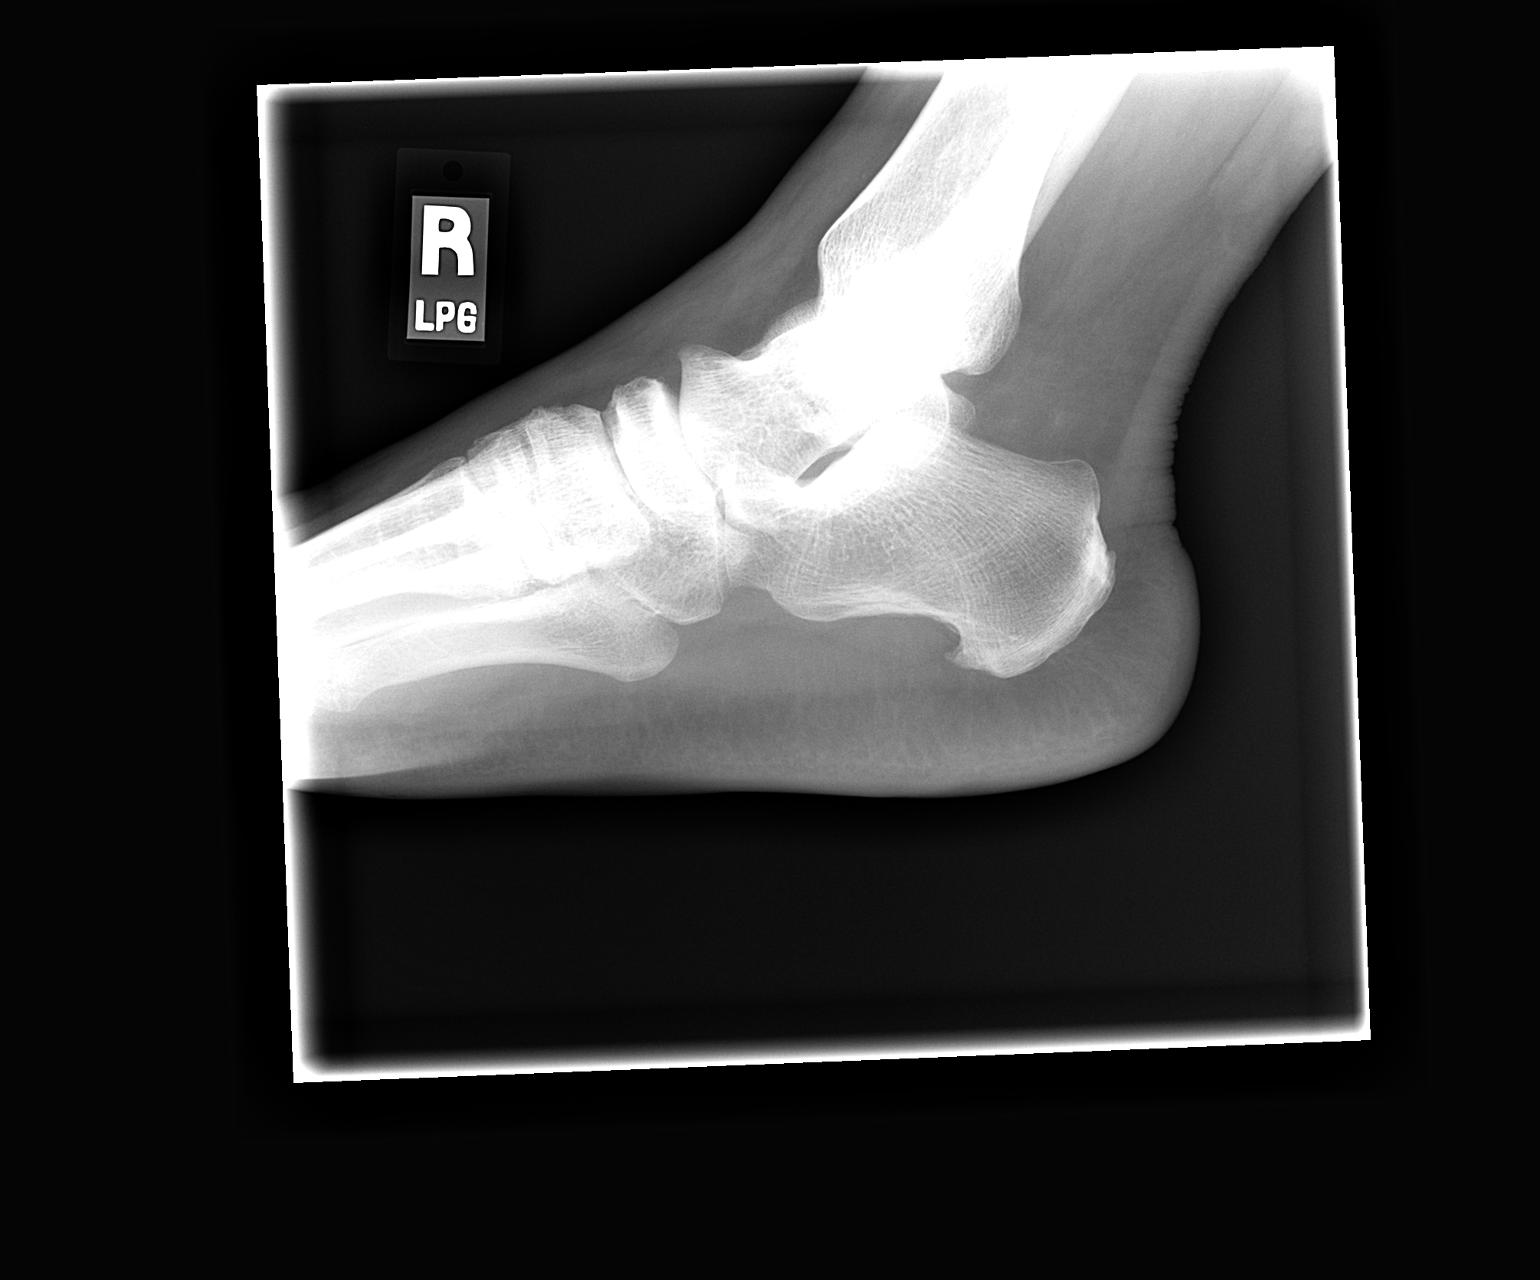

[3 of 3 positions shown; findings below may reference images not displayed]

FINDINGS: No acute fracture is seen.  The ankle joint appears
normal.  Only a small plantar calcaneal degenerative spur is noted.
IMPRESSION: No acute bony abnormality.

## 2010-01-11 ENCOUNTER — Emergency Department (HOSPITAL_COMMUNITY)
Admission: EM | Admit: 2010-01-11 | Discharge: 2010-01-11 | Payer: Self-pay | Source: Home / Self Care | Admitting: Emergency Medicine

## 2010-01-11 IMAGING — CR DG FOOT COMPLETE 3+V*R*
3 series · 3 of 3 positions shown · non-contrast
Comparison: None

CLINICAL DATA: Foot injury, pain.

RIGHT FOOT COMPLETE - 3+ VIEW

[view not recorded (1 of 3)]
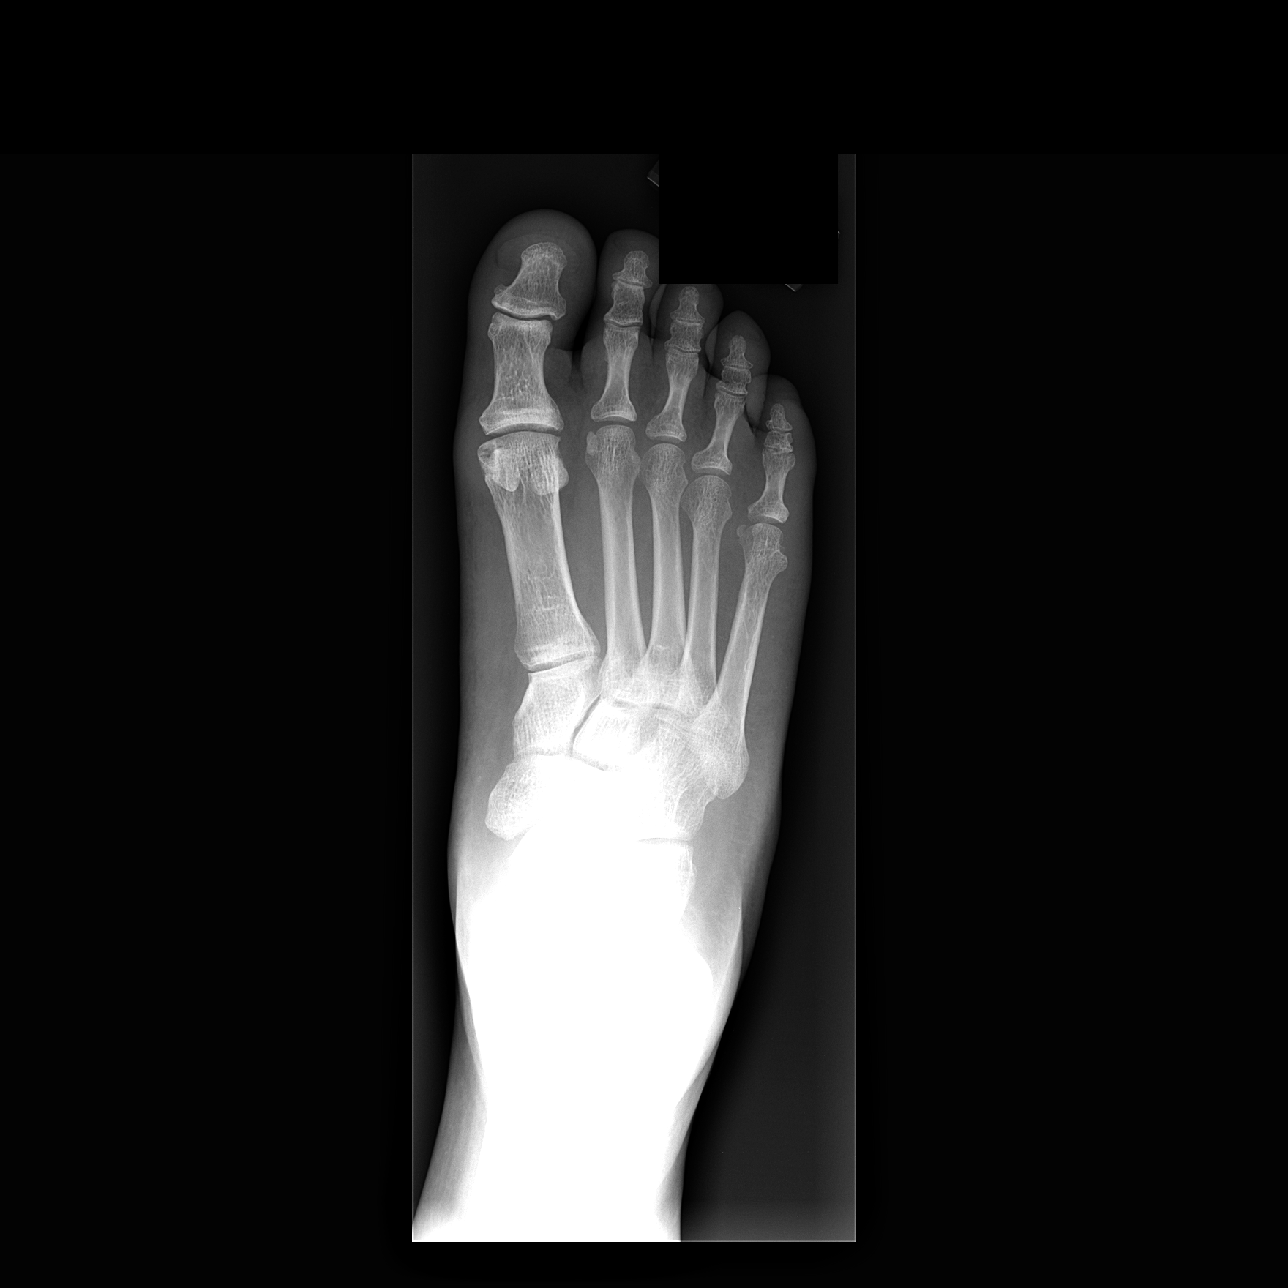

[view not recorded (2 of 3)]
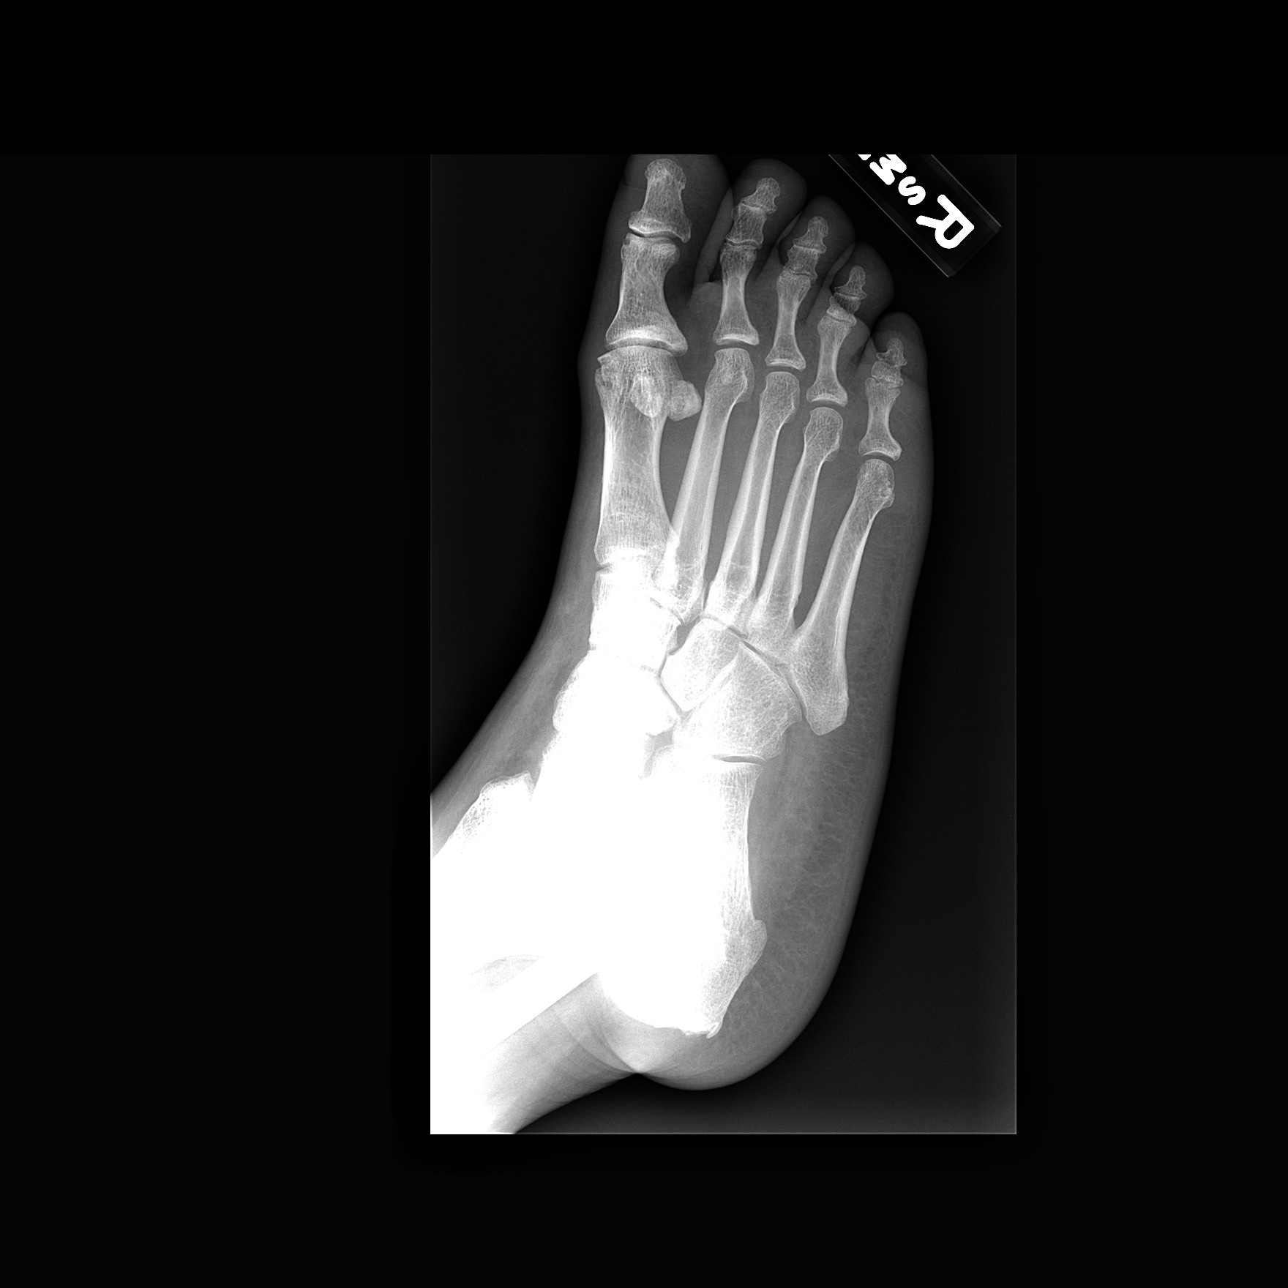

[view not recorded (3 of 3)]
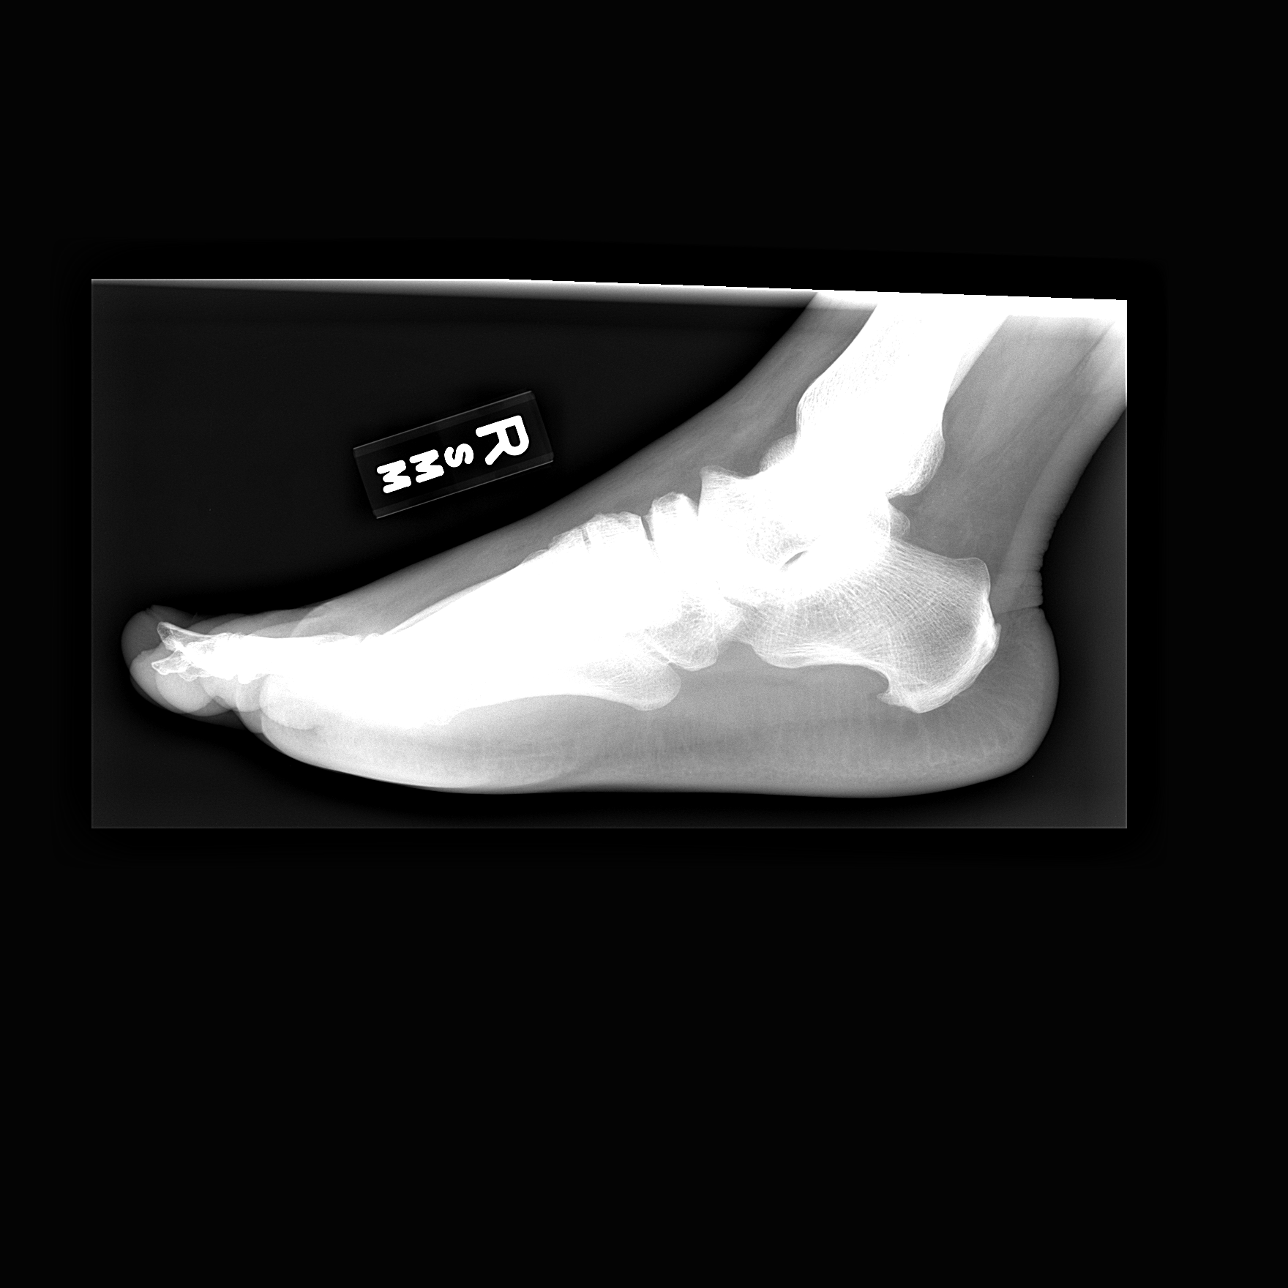

[3 of 3 positions shown; findings below may reference images not displayed]

FINDINGS: No acute bony abnormality.  Specifically, no fracture,
subluxation, or dislocation.  Soft tissues are intact.  Plantar
calcaneal spur noted.  Mild degenerative changes in the first MTP
joint.
IMPRESSION: No acute bony abnormality.

## 2010-05-21 ENCOUNTER — Inpatient Hospital Stay (INDEPENDENT_AMBULATORY_CARE_PROVIDER_SITE_OTHER)
Admission: RE | Admit: 2010-05-21 | Discharge: 2010-05-21 | Disposition: A | Discharge status: Home / Self Care | Payer: Self-pay | Source: Ambulatory Visit | Attending: Family Medicine | Admitting: Family Medicine

## 2010-05-21 ENCOUNTER — Emergency Department (HOSPITAL_COMMUNITY)
Admission: EM | Admit: 2010-05-21 | Discharge: 2010-05-21 | Disposition: A | Discharge status: Home / Self Care | Payer: Self-pay | Attending: Emergency Medicine | Admitting: Emergency Medicine

## 2010-05-21 LAB — POCT CARDIAC MARKERS: Troponin i, poc: <0.05 ng/mL (ref 0.00–0.09)

## 2010-05-21 LAB — BASIC METABOLIC PANEL
BUN: 13 mg/dL (ref 6–23)
CO2: 28 mEq/L (ref 19–32)
Chloride: 104 mEq/L (ref 96–112)
Creatinine, Ser: 1.13 mg/dL (ref 0.4–1.5)
Potassium: 3.8 mEq/L (ref 3.5–5.1)

## 2010-06-07 ENCOUNTER — Encounter (INDEPENDENT_AMBULATORY_CARE_PROVIDER_SITE_OTHER): Payer: Self-pay | Admitting: *Deleted

## 2010-06-07 LAB — CONVERTED CEMR LAB
Basophils Relative: 1 % (ref 0–1)
Eosinophils Absolute: 0 10*3/uL (ref 0.0–0.7)
Eosinophils Relative: 1 % (ref 0–5)
HCT: 41.8 % (ref 39.0–52.0)
Lymphs Abs: 2.3 10*3/uL (ref 0.7–4.0)
MCHC: 32.8 g/dL (ref 30.0–36.0)
MCV: 90.9 fL (ref 78.0–100.0)
Monocytes Relative: 3 % (ref 3–12)
Neutrophils Relative %: 49 % (ref 43–77)
Platelets: 259 10*3/uL (ref 150–400)
WBC: 4.9 10*3/uL (ref 4.0–10.5)

## 2010-08-12 NOTE — Op Note (Signed)
NAME:  Jason Rhodes, BATY NO.:  [id number]   MEDICAL RECORD NO.:  [id number]          PATIENT TYPE:  AMB   LOCATION:  [facility]                         FACILITY:  [facility]   PHYSICIAN:  Anselm Pancoast. Weatherly, M.D.DATE OF BIRTH:  March 11, 1951   DATE OF PROCEDURE:  12/20/2005  DATE OF DISCHARGE:                                 OPERATIVE REPORT   PREOPERATIVE DIAGNOSIS:  Bilateral gynecomastia.   OPERATION:  Excision of bilateral gynecomastia, fairly large areas.   ANESTHESIA:  General anesthesia.   SURGEON:  Anselm Pancoast. Zachery Dakins, M.D.   HISTORY:  Jason Rhodes is a 60 year old, moderately overweight, black male that  I first saw 2 months ago when he was referred for gynecomastia.  He said  this is tender.  Dr. Renato Gails is his regular doctor.  He has had problems with  increased weight.  He had been seen at __________ where they did mammograms  that showed some asymmetry.  The left is larger than the right but I do not  appreciate any axillary nodes or other things.  I thought this was all most  likely benign gynecomastia.  He returned approximately 2 months later and  said he would like to go ahead and have the areas completely removed.  There  had been no change on physical exam and I recommended that we do this as an  outpatient with general anesthesia.  Preoperatively today the area was  marked.  The area on the left is about a 3 x 4.5-5 inch area. The area on  the right is slightly smaller.  This was marked and you can feel the breast  tissue and then you kind of feel the normal fatty tissue in the surrounding  areas.  We will make subareolar incisions extending them a little medial and  laterally so that this large area can be excised since he has got a very  small areolar complex.   PROCEDURE:  Sharp dissection after prepping him with Betadine surgical scrub  and solution.  The left side was done first.  The skin was elevated. The  nipple was removed from the underlying breast  tissue, and then working in  the immediate subcutaneous tissue, the breast tissue was completely  separated from the surrounding fat.  I then went inferiorly and got under  the breast on the pectoral fascia and then hemostasis was obtained  predominantly with cautery.  Some of the vessels were sutured with 4-0  Vicryl.  Good hemostasis.  The area was completely excised.  Wet gauze was  placed in the wound and then we directed our attention to the right side in  a similar manner.  I gained hemostasis, and then after everything was  completely excised, I  inspected the wound carefully.  A few little  questionable areas were coagulated.  A few were sutured with 4-0 Vicryl.  Then I put Marcaine down laterally to hopefully minimize the immediate  postoperative pain.  I did not place any drains.  I used a few 4-0 Monocryl  sutures subcuticular and then a couple of 4-0  nylon right at the corners of  the skin incision at the areolar edge and then the remaining simple stitches  were 5-0 nylon.  The dressing was applied lightly so it was not extremely  tight on the chest wall.  He will minimize his activity for the next 3-4  days.  I will see him back in the office early next week.  I expect that we  will have to aspirate some areas of serum but hopefully there will be no  skin loss.  He hopes to return to work next week. He received a gram of  Kefzol immediately preoperatively.          ______________________________  Anselm Pancoast. Zachery Dakins, M.D.    WJW/MEDQ  D:  12/20/2005  T:  12/22/2005  Job:  [id number]

## 2010-08-12 NOTE — Op Note (Signed)
NAME:  Jason Rhodes NO.:  [id number]   MEDICAL RECORD NO.:  [id number]          PATIENT TYPE:  AMB   LOCATION:  [facility]                          FACILITY:  [facility]   PHYSICIAN:  Anselm Pancoast. Weatherly, M.D.DATE OF BIRTH:  1951-02-16   DATE OF PROCEDURE:  DATE OF DISCHARGE:                                 OPERATIVE REPORT   REFERRING PHYSICIAN:  Deatra James, M.D.   PREOPERATIVE DIAGNOSIS:  Supraumbilical ventral hernia.   OPERATION:  Repair of supraumbilical ventral hernia with mesh  preoperitoneally.   ANESTHESIA:  General anesthesia.   SURGEON:  Anselm Pancoast. Zachery Dakins, M.D.   HISTORY:  Jason Rhodes is a 60 year old male who was referred to me by Dr.  Nicholos Johns for management of a symptomatic hernia within the area above his  umbilicus.  He has got a diastasis recti on the upper abdomen but an obvious  hernia about the size of a golf ball or larger and it is reducible.  I did  stool Hemoccults, which were negative and recommended we repair this with  general anesthesia, planning the mesh in the preperitoneal area.   The patient was taken to the operating suite.  Induction of general  anesthesia, an oral EOA tube and then the abdomen was prepped with Betadine  surgical scrubbing solution and draped in a sterile manner.  I made a small  vertical incision about two and a half inches in length, dissected down the  hernia.  The preperitoneal sac space was separated circumferentially from  the fascia and this reduced into the peritoneal cavity.  I then worked  circumferentially to about an inch and a half in all directions, did  separate the peritoneum where it actually goes into the umbilicus so I could  place the mesh coming up below the umbilicus and then extending up above the  actual fascia defect probably about an inch and a half.  A piece of mesh  shaped like basically a rectangle but about 6 cm or about four inches in  length, about two and a half inches wide was placed  and I sutured the  lateral corners and area through the fascia, working a little subcutaneous  pocket going through the fascia with an 0 Prolene, picking up the mesh,  going back in so that the knots are on the outside of the rectus muscle, but  in the subcutaneous area.  After all the sutures had been placed, these were  all then tied and then I actually closed the fascia vertically incorporating  a little piece of the center of the mesh in each of the simple sutures of 0  Prolene also.  I then actually anesthetized the muscle layer with 0.5% plain  Marcaine prior to tying the fascial sutures.  Next, the subcutaneous was  closed with 3-0 Vicryl.  I used a 5-0 Vicryl subcuticular and then Benzoin  and Steri-Strips on the skin.  The mesh was lying flat and he was breathing  spontaneously and was sent to the recovery room in stable postoperative  condition.  He will be released  after a short stay and understands he should  not be doing any heaving lifting for approximately three or four  weeks.  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  The patient will be released after a short stay in the recovery room.  He  was given a gram of Kefzol immediately preoperatively.      WJW/MEDQ  D:  05/20/2004  T:  05/20/2004  Job:  [id number]   cc:   Deatra James, M.D.  Fax: [phone]   Elana Alm. Nicholos Johns, M.D.  [address]  [place]  [place] [address]  Fax: [phone]

## 2010-11-07 ENCOUNTER — Inpatient Hospital Stay (INDEPENDENT_AMBULATORY_CARE_PROVIDER_SITE_OTHER)
Admission: RE | Admit: 2010-11-07 | Discharge: 2010-11-07 | Disposition: A | Discharge status: Home / Self Care | Payer: Self-pay | Source: Ambulatory Visit | Attending: Family Medicine | Admitting: Family Medicine

## 2010-11-07 DIAGNOSIS — I10 Essential (primary) hypertension: Secondary | ICD-10-CM

## 2010-11-07 LAB — POCT I-STAT, CHEM 8
Creatinine, Ser: 1.2 mg/dL (ref 0.50–1.35)
Glucose, Bld: 94 mg/dL (ref 70–99)
HCT: 41 % (ref 39.0–52.0)
Hemoglobin: 13.9 g/dL (ref 13.0–17.0)
Sodium: 142 mEq/L (ref 135–145)
TCO2: 27 mmol/L (ref 0–100)

## 2010-12-09 ENCOUNTER — Inpatient Hospital Stay (INDEPENDENT_AMBULATORY_CARE_PROVIDER_SITE_OTHER)
Admission: RE | Admit: 2010-12-09 | Discharge: 2010-12-09 | Disposition: A | Discharge status: Home / Self Care | Payer: Self-pay | Source: Ambulatory Visit | Attending: Family Medicine | Admitting: Family Medicine

## 2010-12-09 DIAGNOSIS — I10 Essential (primary) hypertension: Secondary | ICD-10-CM

## 2010-12-26 LAB — BASIC METABOLIC PANEL WITH GFR
CO2: 27
Chloride: 107
Creatinine, Ser: 1.16
GFR calc Af Amer: >60
Potassium: 4.1
Sodium: 139

## 2010-12-26 LAB — CBC
HCT: 38.4 — ABNORMAL LOW
Hemoglobin: 12.7 — ABNORMAL LOW
MCHC: 33.1
MCV: 93.1
Platelets: 218
RBC: 4.12 — ABNORMAL LOW
RDW: 12.6
WBC: 4.8

## 2010-12-26 LAB — BASIC METABOLIC PANEL
BUN: 11
Calcium: 9
GFR calc non Af Amer: >60
Glucose, Bld: 90

## 2010-12-26 LAB — DIFFERENTIAL
Basophils Absolute: 0
Basophils Relative: 1
Eosinophils Absolute: 0.1
Eosinophils Relative: 1
Lymphocytes Relative: 47 — ABNORMAL HIGH
Lymphs Abs: 2.2
Monocytes Absolute: 0.3
Monocytes Relative: 6
Neutro Abs: 2.1
Neutrophils Relative %: 45

## 2010-12-26 LAB — POCT CARDIAC MARKERS
CKMB, poc: 1.9
Myoglobin, poc: 118
Troponin i, poc: <0.05

## 2010-12-26 LAB — D-DIMER, QUANTITATIVE: D-Dimer, Quant: 0.22

## 2012-02-29 ENCOUNTER — Emergency Department (HOSPITAL_COMMUNITY)
Admission: EM | Admit: 2012-02-29 | Discharge: 2012-02-29 | Disposition: A | Discharge status: Home / Self Care | Payer: No Typology Code available for payment source | Source: Home / Self Care

## 2012-02-29 ENCOUNTER — Encounter (HOSPITAL_COMMUNITY): Payer: Self-pay

## 2012-02-29 DIAGNOSIS — I10 Essential (primary) hypertension: Secondary | ICD-10-CM

## 2012-02-29 MED ORDER — LISINOPRIL 40 MG PO TABS: 40.0000 mg | ORAL_TABLET | Freq: Every day | ORAL | Status: DC

## 2012-02-29 MED ORDER — AMLODIPINE BESYLATE 10 MG PO TABS: 10.0000 mg | ORAL_TABLET | Freq: Every day | ORAL | Status: DC

## 2012-02-29 NOTE — ED Notes (Signed)
Medication refill

## 2012-02-29 NOTE — H&P (Signed)
Patient Demographics  Jason Rhodes, is a 61 y.o. male  ZOX:[id number]  WJX:[id number]  DOB - March 02, 1951  Chief Complaint  Patient presents with  . Medication Refill        Subjective:   Jason Rhodes is a 61 year male with history of hypertension who presents for followup of his blood pressure and medication refills Today has, No headache, No chest pain, No abdominal pain - No Nausea, No new weakness tingling or numbness, No Cough - SOB.   Objective:    Filed Vitals:   02/29/12 1147  BP: 127/90  Pulse: 61  Temp: 98.1 F (36.7 C)  TempSrc: Oral  Resp: 19  SpO2: 100%     Exam  Awake Alert, Oriented X 3, No new F.N deficits, Normal affect [place].AT,PERRAL Supple Neck,No JVD, No cervical lymphadenopathy appriciated.  Symmetrical Chest wall movement, Good air movement bilaterally, CTAB RRR,No Gallops,Rubs or new Murmurs, No Parasternal Heave +ve B.Sounds, Abd Soft, Non tender, No organomegaly appriciated, No rebound - guarding or rigidity. No Cyanosis, Clubbing or edema, no rash    Data Review   CBC No results found for this basename: WBC:5,HGB:5,HCT:5,PLT:5,MCV:5,MCH:5,MCHC:5,RDW:5,NEUTRABS:5,LYMPHSABS:5,MONOABS:5,EOSABS:5,BASOSABS:5,BANDABS:5,BANDSABD:5 in the last 168 hours  Chemistries   No results found for this basename: NA:5,K:5,CL:5,CO2:5,GLUCOSE:5,BUN:5,CREATININE:5,GFRCGP,:5,CALCIUM:5,MG:5,AST:5,ALT:5,ALKPHOS:5,BILITOT:5 in the last 168 hours ------------------------------------------------------------------------------------------------------------------ No results found for this basename: HGBA1C:2 in the last 72 hours ------------------------------------------------------------------------------------------------------------------ No results found for this basename: CHOL:2,HDL:2,LDLCALC:2,TRIG:2,CHOLHDL:2,LDLDIRECT:2 in the last 72 hours ------------------------------------------------------------------------------------------------------------------ No results  found for this basename: TSH,T4TOTAL,FREET3,T3FREE,THYROIDAB in the last 72 hours ------------------------------------------------------------------------------------------------------------------ No results found for this basename: VITAMINB12:2,FOLATE:2,FERRITIN:2,TIBC:2,IRON:2,RETICCTPCT:2 in the last 72 hours  Coagulation profile  No results found for this basename: INR:5,PROTIME:5 in the last 168 hours     Prior to Admission medications   Medication Sig Start Date End Date Taking? Authorizing Provider  amLODipine (NORVASC) 10 MG tablet Take 10 mg by mouth daily.   Yes Historical Provider, MD  lisinopril (PRINIVIL,ZESTRIL) 40 MG tablet Take 40 mg by mouth daily.   Yes Historical Provider, MD  amLODipine (NORVASC) 10 MG tablet Take 1 tablet (10 mg total) by mouth daily. 02/29/12   Kela Millin, MD  lisinopril (PRINIVIL,ZESTRIL) 40 MG tablet Take 1 tablet (40 mg total) by mouth daily. 02/29/12   Kela Millin, MD     Assessment & Plan  Hypertension -Okay control, will continue lisinopril and Norvasc -Also discussed continuing to work on lifestyle changes with patient -Recommend for patient to have labs when he follows up in Center in 3 months.    Follow-up Information    Please follow up. (At adult care center In 3 months)           Dyron Kawano C M.D on 02/29/2012 at 12:38 PM

## 2012-07-12 ENCOUNTER — Emergency Department (HOSPITAL_COMMUNITY)
Admission: EM | Admit: 2012-07-12 | Discharge: 2012-07-12 | Disposition: A | Discharge status: Home Health | Payer: No Typology Code available for payment source | Source: Home / Self Care

## 2012-07-12 ENCOUNTER — Encounter (HOSPITAL_COMMUNITY): Payer: Self-pay

## 2012-07-12 DIAGNOSIS — N400 Enlarged prostate without lower urinary tract symptoms: Secondary | ICD-10-CM

## 2012-07-12 DIAGNOSIS — I10 Essential (primary) hypertension: Secondary | ICD-10-CM

## 2012-07-12 MED ORDER — TAMSULOSIN HCL 0.4 MG PO CAPS: 0.4000 mg | ORAL_CAPSULE | Freq: Every day | ORAL | Status: DC

## 2012-07-12 MED ORDER — LISINOPRIL 40 MG PO TABS: 40.0000 mg | ORAL_TABLET | Freq: Every day | ORAL | Status: DC

## 2012-07-12 MED ORDER — NAPROXEN 500 MG PO TABS: 500.0000 mg | ORAL_TABLET | Freq: Two times a day (BID) | ORAL | Status: DC | PRN

## 2012-07-12 MED ORDER — AMLODIPINE BESYLATE 10 MG PO TABS: 5.0000 mg | ORAL_TABLET | Freq: Every day | ORAL | Status: DC

## 2012-07-12 NOTE — ED Provider Notes (Addendum)
Patient Demographics  Jason Rhodes, is a 62 y.o. male  ZOX:[id number]  WJX:[id number]  DOB - May 11, 1950  No chief complaint on file.       Subjective:   Jason Rhodes today is here for a follow up vist. His main complaint today is that he complains of urinary hesitancy, poor stream with some dribbling. He also claims that he has to strain. While he was a patient at [facility]- he claims he had elevated BS he level and is anxious to get it checked. Patient has No headache, No chest pain, No abdominal pain - No Nausea, No new weakness tingling or numbness, No Cough - SOB.   Objective:    Filed Vitals:   07/12/12 1705  BP: 112/89  Pulse: 62  Temp: 98.8 F (37.1 C)  TempSrc: Oral  Resp: 18  SpO2: 99%     ALLERGIES:  No Known Allergies  PAST MEDICAL HISTORY: History reviewed. No pertinent past medical history.  MEDICATIONS AT HOME: Prior to Admission medications   Medication Sig Start Date End Date Taking? Authorizing Provider  amLODipine (NORVASC) 10 MG tablet Take 0.5 tablets (5 mg total) by mouth daily. 07/12/12   Shanker Levora Dredge, MD  lisinopril (PRINIVIL,ZESTRIL) 40 MG tablet Take 1 tablet (40 mg total) by mouth daily. 07/12/12   Shanker Levora Dredge, MD  tamsulosin (FLOMAX) 0.4 MG CAPS Take 1 capsule (0.4 mg total) by mouth daily. 07/12/12   Shanker Levora Dredge, MD     Exam  General appearance :Awake, alert, not in any distress. Speech Clear. Not toxic Looking HEENT: Atraumatic and Normocephalic, pupils equally reactive to light and accomodation Neck: supple, no JVD. No cervical lymphadenopathy.  Chest:Good air entry bilaterally, no added sounds  CVS: S1 S2 regular, no murmurs.  Abdomen: Bowel sounds present, Non tender and not distended with no gaurding, rigidity or rebound. Extremities: B/L Lower Ext shows no edema, both legs are warm to touch Neurology: Awake alert, and oriented X 3, CN II-XII intact, Non focal Skin:No Rash Wounds:N/A    Data Review   CBC No  results found for this basename: WBC, HGB, HCT, PLT, MCV, MCH, MCHC, RDW, NEUTRABS, LYMPHSABS, MONOABS, EOSABS, BASOSABS, BANDABS, BANDSABD,  in the last 168 hours  Chemistries   No results found for this basename: NA, K, CL, CO2, GLUCOSE, BUN, CREATININE, GFRCGP, CALCIUM, MG, AST, ALT, ALKPHOS, BILITOT,  in the last 168 hours ------------------------------------------------------------------------------------------------------------------ No results found for this basename: HGBA1C,  in the last 72 hours ------------------------------------------------------------------------------------------------------------------ No results found for this basename: CHOL, HDL, LDLCALC, TRIG, CHOLHDL, LDLDIRECT,  in the last 72 hours ------------------------------------------------------------------------------------------------------------------ No results found for this basename: TSH, T4TOTAL, FREET3, T3FREE, THYROIDAB,  in the last 72 hours ------------------------------------------------------------------------------------------------------------------ No results found for this basename: VITAMINB12, FOLATE, FERRITIN, TIBC, IRON, RETICCTPCT,  in the last 72 hours  Coagulation profile  No results found for this basename: INR, PROTIME,  in the last 168 hours    Assessment & Plan   Hypertension - Continue with lisinopril 40 mg by mouth daily - Decreased amlodipine to 5 mg by mouth daily- as adding Flomax 0.4 mg daily  Suspected BPH -claims while he was a patient at [facility], he had an elevated PSA and anxious to repeat another PSA level - Start Flomax, check PSA prior to next visit  PSA, chemistries ordered prior to next visit. Please check at next visit    Follow-up Information   Follow up with [facility]. Schedule an appointment as soon as possible for a visit in  6 weeks.      Maretta Bees, MD 07/12/12 1610  Maretta Bees, MD 07/12/12 1726

## 2012-07-12 NOTE — ED Notes (Signed)
Patient complains of having problems urinating and some pain Takes longer to void Last time was prostate was checked was about 2 years ago

## 2012-08-09 ENCOUNTER — Other Ambulatory Visit: Payer: No Typology Code available for payment source

## 2012-08-12 ENCOUNTER — Ambulatory Visit: Payer: No Typology Code available for payment source | Attending: Internal Medicine

## 2012-08-12 VITALS — Temp 97.7°F

## 2012-08-12 DIAGNOSIS — R350 Frequency of micturition: Secondary | ICD-10-CM

## 2012-08-12 DIAGNOSIS — E1110 Type 2 diabetes mellitus with ketoacidosis without coma: Secondary | ICD-10-CM

## 2012-08-12 LAB — CBC WITH DIFFERENTIAL/PLATELET
Basophils Absolute: 0 10*3/uL (ref 0.0–0.1)
Eosinophils Relative: 1 % (ref 0–5)
HCT: 37.3 % — ABNORMAL LOW (ref 39.0–52.0)
Lymphocytes Relative: 41 % (ref 12–46)
MCH: 29.9 pg (ref 26.0–34.0)
MCV: 89.2 fL (ref 78.0–100.0)
Monocytes Absolute: 0.4 10*3/uL (ref 0.1–1.0)
RDW: 13.4 % (ref 11.5–15.5)
WBC: 4.7 10*3/uL (ref 4.0–10.5)

## 2012-08-12 NOTE — Progress Notes (Unsigned)
Patient here for scheduled labs only

## 2012-08-13 LAB — BASIC METABOLIC PANEL
BUN: 13 mg/dL (ref 6–23)
CO2: 26 mEq/L (ref 19–32)
Chloride: 105 mEq/L (ref 96–112)
Creat: 1 mg/dL (ref 0.50–1.35)

## 2012-08-27 ENCOUNTER — Ambulatory Visit: Payer: No Typology Code available for payment source | Attending: Family Medicine | Admitting: Family Medicine

## 2012-08-27 VITALS — BP 159/107 | HR 73 | Temp 98.7°F | Resp 16 | Wt 209.6 lb

## 2012-08-27 DIAGNOSIS — N400 Enlarged prostate without lower urinary tract symptoms: Secondary | ICD-10-CM

## 2012-08-27 DIAGNOSIS — I10 Essential (primary) hypertension: Secondary | ICD-10-CM

## 2012-08-27 HISTORY — DX: Essential (primary) hypertension: I10

## 2012-08-27 HISTORY — DX: Benign prostatic hyperplasia without lower urinary tract symptoms: N40.0

## 2012-08-27 MED ORDER — TAMSULOSIN HCL 0.4 MG PO CAPS: 0.4000 mg | ORAL_CAPSULE | Freq: Every day | ORAL | Status: DC

## 2012-08-27 MED ORDER — AMLODIPINE BESYLATE 10 MG PO TABS: 10.0000 mg | ORAL_TABLET | Freq: Every day | ORAL | Status: DC

## 2012-08-27 MED ORDER — LISINOPRIL 40 MG PO TABS: 40.0000 mg | ORAL_TABLET | Freq: Every day | ORAL | Status: DC

## 2012-08-27 NOTE — Progress Notes (Signed)
Patient ID: Jason Rhodes, male   DOB: 1950-11-15, 62 y.o.   MRN: [id number]  CC: follow up   HPI: Pt says that the flomax is helping with his symptoms.  His blood pressure is elevated since decreasing his amlodipine medication.  He says he is feeling better.  His urinary stream is better. He is reporting that he has a history of colon polyps and reports that he has had 2 recent colonoscopies.    No Known Allergies History reviewed. No pertinent past medical history. Current Outpatient Prescriptions on File Prior to Visit  Medication Sig Dispense Refill  . naproxen (NAPROSYN) 500 MG tablet Take 1 tablet (500 mg total) by mouth 2 (two) times daily as needed.  60 tablet  1   No current facility-administered medications on file prior to visit.   History reviewed. No pertinent family history. History   Social History  . Marital Status: Single    Spouse Name: N/A    Number of Children: N/A  . Years of Education: N/A   Occupational History  . Not on file.   Social History Main Topics  . Smoking status: Never Smoker   . Smokeless tobacco: Not on file  . Alcohol Use: No  . Drug Use:   . Sexually Active:    Other Topics Concern  . Not on file   Social History Narrative  . No narrative on file    Review of Systems  Constitutional: Negative for fever, chills, diaphoresis, activity change, appetite change and fatigue.  HENT: Negative for ear pain, nosebleeds, congestion, facial swelling, rhinorrhea, neck pain, neck stiffness and ear discharge.   Eyes: Negative for pain, discharge, redness, itching and visual disturbance.  Respiratory: Negative for cough, choking, chest tightness, shortness of breath, wheezing and stridor.   Cardiovascular: Negative for chest pain, palpitations and leg swelling.  Gastrointestinal: Negative for abdominal distention.  Genitourinary:decreased urine volume;  Negative for dysuria, urgency, frequency, hematuria, flank pain, , difficulty urinating and  dyspareunia.  Musculoskeletal: Negative for back pain, joint swelling, arthralgias and gait problem.  Neurological: Negative for dizziness, tremors, seizures, syncope, facial asymmetry, speech difficulty, weakness, light-headedness, numbness and headaches.  Hematological: Negative for adenopathy. Does not bruise/bleed easily.  Psychiatric/Behavioral: Negative for hallucinations, behavioral problems, confusion, dysphoric mood, decreased concentration and agitation.    Objective:   Filed Vitals:   08/27/12 1041  BP: 159/107  Pulse: 73  Temp: 98.7 F (37.1 C)  Resp: 16    Physical Exam  Constitutional: Appears well-developed and well-nourished. No distress.  HENT: Normocephalic. External right and left ear normal. Oropharynx is clear and moist.  Eyes: Conjunctivae and EOM are normal. PERRLA, no scleral icterus.  Neck: Normal ROM. Neck supple. No JVD. No tracheal deviation. No thyromegaly.  CVS: RRR, S1/S2 +, no murmurs, no gallops, no carotid bruit.  Pulmonary: Effort and breath sounds normal, no stridor, rhonchi, wheezes, rales.  Abdominal: Soft. BS +,  no distension, tenderness, rebound or guarding.  Musculoskeletal: Normal range of motion. No edema and no tenderness.  Lymphadenopathy: No lymphadenopathy noted, cervical, inguinal. Neuro: Alert. Normal reflexes, muscle tone coordination. No cranial nerve deficit. Skin: Skin is warm and dry. No rash noted. Not diaphoretic. No erythema. No pallor.  Psychiatric: Normal mood and affect. Behavior, judgment, thought content normal.   Lab Results  Component Value Date   WBC 4.7 08/12/2012   HGB 12.5* 08/12/2012   HCT 37.3* 08/12/2012   MCV 89.2 08/12/2012   PLT 258 08/12/2012   Lab Results  Component  Value Date   CREATININE 1.00 08/12/2012   BUN 13 08/12/2012   NA 138 08/12/2012   K 4.4 08/12/2012   CL 105 08/12/2012   CO2 26 08/12/2012    No results found for this basename: HGBA1C   Lipid Panel  No results found for this basename:  chol, trig, hdl, cholhdl, vldl, ldlcalc      Assessment and plan:   Patient Active Problem List   Diagnosis Date Noted  . BPH (benign prostatic hyperplasia) 08/27/2012  . Unspecified essential hypertension 08/27/2012   Reviewed labs with patient today Mild elevated psa noted.  Recheck psa in 2-3 months Increase amlodipine back to 10 mg po daily Continue home BP monitoring and pt instructed to call our office with home BP readings in 2 weeks Continue lisinopril 40 mg daily Continue flomax 0.4 mg po daily  RTC in 10 weeks  The patient was given clear instructions to go to ER or return to medical center if symptoms don't improve, worsen or new problems develop.  The patient verbalized understanding.  The patient was told to call to get lab results if they haven't heard anything in the next week.    Rodney Langton, MD, CDE, FAAFP [facility] [place], [place]

## 2012-08-27 NOTE — Progress Notes (Signed)
Patient here for follow up of prostate issues Still having frequent urination

## 2012-08-27 NOTE — Patient Instructions (Signed)
Hypertension As your heart beats, it forces blood through your arteries. This force is your blood pressure. If the pressure is too high, it is called hypertension (HTN) or high blood pressure. HTN is dangerous because you may have it and not know it. High blood pressure may mean that your heart has to work harder to pump blood. Your arteries may be narrow or stiff. The extra work puts you at risk for heart disease, stroke, and other problems.  Blood pressure consists of two numbers, a higher number over a lower, 110/72, for example. It is stated as "110 over 72." The ideal is below 120 for the top number (systolic) and under 80 for the bottom (diastolic). Write down your blood pressure today. You should pay close attention to your blood pressure if you have certain conditions such as:  Heart failure.  Prior heart attack.  Diabetes  Chronic kidney disease.  Prior stroke.  Multiple risk factors for heart disease. To see if you have HTN, your blood pressure should be measured while you are seated with your arm held at the level of the heart. It should be measured at least twice. A one-time elevated blood pressure reading (especially in the Emergency Department) does not mean that you need treatment. There may be conditions in which the blood pressure is different between your right and left arms. It is important to see your caregiver soon for a recheck. Most people have essential hypertension which means that there is not a specific cause. This type of high blood pressure may be lowered by changing lifestyle factors such as:  Stress.  Smoking.  Lack of exercise.  Excessive weight.  Drug/tobacco/alcohol use.  Eating less salt. Most people do not have symptoms from high blood pressure until it has caused damage to the body. Effective treatment can often prevent, delay or reduce that damage. TREATMENT  When a cause has been identified, treatment for high blood pressure is directed at the  cause. There are a large number of medications to treat HTN. These fall into several categories, and your caregiver will help you select the medicines that are best for you. Medications may have side effects. You should review side effects with your caregiver. If your blood pressure stays high after you have made lifestyle changes or started on medicines,   Your medication(s) may need to be changed.  Other problems may need to be addressed.  Be certain you understand your prescriptions, and know how and when to take your medicine.  Be sure to follow up with your caregiver within the time frame advised (usually within two weeks) to have your blood pressure rechecked and to review your medications.  If you are taking more than one medicine to lower your blood pressure, make sure you know how and at what times they should be taken. Taking two medicines at the same time can result in blood pressure that is too low. SEEK IMMEDIATE MEDICAL CARE IF:  You develop a severe headache, blurred or changing vision, or confusion.  You have unusual weakness or numbness, or a faint feeling.  You have severe chest or abdominal pain, vomiting, or breathing problems. MAKE SURE YOU:   Understand these instructions.  Will watch your condition.  Will get help right away if you are not doing well or get worse. Document Released: 03/13/2005 Document Revised: 06/05/2011 Document Reviewed: 11/01/2007 [facility] Patient Information 2014 [place]. DASH Diet The DASH diet stands for "Dietary Approaches to Stop Hypertension." It is a healthy  eating plan that has been shown to reduce high blood pressure (hypertension) in as little as 14 days, while also possibly providing other significant health benefits. These other health benefits include reducing the risk of breast cancer after menopause and reducing the risk of type 2 diabetes, heart disease, colon cancer, and stroke. Health benefits also include weight loss  and slowing kidney failure in patients with chronic kidney disease.  DIET GUIDELINES  Limit salt (sodium). Your diet should contain less than 1500 mg of sodium daily.  Limit refined or processed carbohydrates. Your diet should include mostly whole grains. Desserts and added sugars should be used sparingly.  Include small amounts of heart-healthy fats. These types of fats include nuts, oils, and tub margarine. Limit saturated and trans fats. These fats have been shown to be harmful in the body. CHOOSING FOODS  The following food groups are based on a 2000 calorie diet. See your Registered Dietitian for individual calorie needs. Grains and Grain Products (6 to 8 servings daily)  Eat More Often: Whole-wheat bread, brown rice, whole-grain or wheat pasta, quinoa, popcorn without added fat or salt (air popped).  Eat Less Often: White bread, white pasta, white rice, cornbread. Vegetables (4 to 5 servings daily)  Eat More Often: Fresh, frozen, and canned vegetables. Vegetables may be raw, steamed, roasted, or grilled with a minimal amount of fat.  Eat Less Often/Avoid: Creamed or fried vegetables. Vegetables in a cheese sauce. Fruit (4 to 5 servings daily)  Eat More Often: All fresh, canned (in natural juice), or frozen fruits. Dried fruits without added sugar. One hundred percent fruit juice ( cup [237 mL] daily).  Eat Less Often: Dried fruits with added sugar. Canned fruit in light or heavy syrup. [employer], Fish, and Poultry (2 servings or less daily. One serving is 3 to 4 oz [85-114 g]).  Eat More Often: Ninety percent or leaner ground beef, tenderloin, sirloin. Round cuts of beef, chicken breast, [place] breast. All fish. Grill, bake, or broil your meat. Nothing should be fried.  Eat Less Often/Avoid: Fatty cuts of meat, [place], or chicken leg, thigh, or wing. Fried cuts of meat or fish. Dairy (2 to 3 servings)  Eat More Often: Low-fat or fat-free milk, low-fat plain or light yogurt,  reduced-fat or part-skim cheese.  Eat Less Often/Avoid: Milk (whole, 2%).Whole milk yogurt. Full-fat cheeses. Nuts, Seeds, and Legumes (4 to 5 servings per week)  Eat More Often: All without added salt.  Eat Less Often/Avoid: Salted nuts and seeds, canned beans with added salt. Fats and Sweets (limited)  Eat More Often: Vegetable oils, tub margarines without trans fats, sugar-free gelatin. Mayonnaise and salad dressings.  Eat Less Often/Avoid: Coconut oils, palm oils, butter, stick margarine, cream, half and half, cookies, candy, pie. FOR MORE INFORMATION The Dash Diet Eating Plan: www.dashdiet.org Document Released: 03/02/2011 Document Revised: 06/05/2011 Document Reviewed: 03/02/2011 [facility] Patient Information 2014 [place], [place]. Benign Prostatic Hyperplasia You have an enlarged prostate. This is common in elderly males. It is called BPH. This stands for benign prostate hyperplasia. The prostate gland is located in base of the bladder. When it grows, the prostate blocks the urethra. This is the tube which drains urine from the bladder.  SYMPTOMS  Weak urine stream.  Dribbling.  Feeling like the bladder has not emptied completely.  Difficulty starting urination.  Getting up frequently at night to urinate.  Urinating more frequently during the day. Complete urinary blockage or severe pain with urination requires immediate attention. DIAGNOSIS   Your  caregiver often has a good idea what is wrong by taking a history and doing a physical exam.  Special x-rays may be done. TREATMENT   For mild problems, no treatment may be necessary.  If the problems are moderate, medications may provide relief. Some of these work by making the prostate gland smaller. The herb saw palmetto is commonly used.  If complete blockage occurs, a Foley catheter is usually left in place for a few days.  Surgery is often needed for more severe problems. TURP is the prostate surgery for BPH which  is done through the urethra. TURP stands for transurethral resection of the prostate. It involves cutting away chips from the prostate. It is done by removing chips so that they can come out through the penis.  Techniques using heat, microwave and laser to remove the prostate blockage are also being used. HOME CARE INSTRUCTIONS   Give yourself time when you urinate.  Stay away from alcohol.  Beverages containing caffeine such as coffee, tea and colas can make the problems worse.  Decongestants, antihistamines, and some prescription medicines can also make the problem worse.  Follow up with your caregiver for further treatment as recommended. SEEK IMMEDIATE MEDICAL CARE IF:   You develop increased pain with urination or are unable to pass your water.  You develop severe abdominal pain, vomiting, a high fever, or fainting.  You develop back pain or blood in your urine. MAKE SURE YOU:   Understand these instructions.  Will watch your condition.  Will get help right away if you are not doing well or get worse. Document Released: 03/13/2005 Document Revised: 06/05/2011 Document Reviewed: 11/16/2006 [facility] Patient Information 2014 [place], [place].

## 2012-10-28 ENCOUNTER — Ambulatory Visit: Payer: No Typology Code available for payment source | Attending: Family Medicine | Admitting: Internal Medicine

## 2012-10-28 VITALS — BP 140/91 | HR 70 | Temp 98.0°F | Resp 16 | Wt 208.0 lb

## 2012-10-28 DIAGNOSIS — I10 Essential (primary) hypertension: Secondary | ICD-10-CM | POA: Insufficient documentation

## 2012-10-28 DIAGNOSIS — N400 Enlarged prostate without lower urinary tract symptoms: Secondary | ICD-10-CM | POA: Insufficient documentation

## 2012-10-28 MED ORDER — FINASTERIDE 5 MG PO TABS: 5.0000 mg | ORAL_TABLET | Freq: Every day | ORAL | Status: DC

## 2012-10-28 NOTE — Progress Notes (Signed)
Patient ID: Jason Rhodes, male   DOB: October 22, 1950, 62 y.o.   MRN: [id number]  CC:  HPI: 62 year old male here at the clinic for followup of his prostrate symptoms. The patient most likely has benign prostatic hypertrophy and was initiated on Flomax by Dr. Laural Benes on 08/27/12. The patient still complains of urinary urgency frequency. He also complains of pain when he is trying to urinate. Occasionally he has testicular swelling that reduces itself. He denies any fever chills riders or flank pain. His PSA was mildly elevated during his last appointment No Chest pain no shortness of breath blood pressure control at home.    No Known Allergies History reviewed. No pertinent past medical history. Current Outpatient Prescriptions on File Prior to Visit  Medication Sig Dispense Refill  . amLODipine (NORVASC) 10 MG tablet Take 1 tablet (10 mg total) by mouth daily.  30 tablet  4  . lisinopril (PRINIVIL,ZESTRIL) 40 MG tablet Take 1 tablet (40 mg total) by mouth daily.  30 tablet  4  . naproxen (NAPROSYN) 500 MG tablet Take 1 tablet (500 mg total) by mouth 2 (two) times daily as needed.  60 tablet  1  . tamsulosin (FLOMAX) 0.4 MG CAPS Take 1 capsule (0.4 mg total) by mouth daily.  30 capsule  4   No current facility-administered medications on file prior to visit.   History reviewed. No pertinent family history. History   Social History  . Marital Status: Single    Spouse Name: N/A    Number of Children: N/A  . Years of Education: N/A   Occupational History  . Not on file.   Social History Main Topics  . Smoking status: Never Smoker   . Smokeless tobacco: Not on file  . Alcohol Use: No  . Drug Use:   . Sexually Active:    Other Topics Concern  . Not on file   Social History Narrative  . No narrative on file    Review of Systems  Constitutional: Negative for fever, chills, diaphoresis, activity change, appetite change and fatigue.  HENT: Negative for ear pain, nosebleeds, congestion,  facial swelling, rhinorrhea, neck pain, neck stiffness and ear discharge.   Eyes: Negative for pain, discharge, redness, itching and visual disturbance.  Respiratory: Negative for cough, choking, chest tightness, shortness of breath, wheezing and stridor.   Cardiovascular: Negative for chest pain, palpitations and leg swelling.  Gastrointestinal: Negative for abdominal distention.  Genitourinary: Negative for dysuria, urgency, frequency, hematuria, flank pain, decreased urine volume, difficulty urinating and dyspareunia.  Musculoskeletal: Negative for back pain, joint swelling, arthralgias and gait problem.  Neurological: Negative for dizziness, tremors, seizures, syncope, facial asymmetry, speech difficulty, weakness, light-headedness, numbness and headaches.  Hematological: Negative for adenopathy. Does not bruise/bleed easily.  Psychiatric/Behavioral: Negative for hallucinations, behavioral problems, confusion, dysphoric mood, decreased concentration and agitation.    Objective:   Filed Vitals:   10/28/12 0907  BP: 140/91  Pulse: 70  Temp: 98 F (36.7 C)  Resp: 16    Physical Exam  Constitutional: Appears well-developed and well-nourished. No distress.  HENT: Normocephalic. External right and left ear normal. Oropharynx is clear and moist.  Eyes: Conjunctivae and EOM are normal. PERRLA, no scleral icterus.  Neck: Normal ROM. Neck supple. No JVD. No tracheal deviation. No thyromegaly.  CVS: RRR, S1/S2 +, no murmurs, no gallops, no carotid bruit.  Pulmonary: Effort and breath sounds normal, no stridor, rhonchi, wheezes, rales.  Abdominal: Soft. BS +,  no distension, tenderness, rebound or guarding.  Musculoskeletal:  Normal range of motion. No edema and no tenderness.  Lymphadenopathy: No lymphadenopathy noted, cervical, inguinal. Neuro: Alert. Normal reflexes, muscle tone coordination. No cranial nerve deficit. Skin: Skin is warm and dry. No rash noted. Not diaphoretic. No  erythema. No pallor.  Psychiatric: Normal mood and affect. Behavior, judgment, thought content normal.   Lab Results  Component Value Date   WBC 4.7 08/12/2012   HGB 12.5* 08/12/2012   HCT 37.3* 08/12/2012   MCV 89.2 08/12/2012   PLT 258 08/12/2012   Lab Results  Component Value Date   CREATININE 1.00 08/12/2012   BUN 13 08/12/2012   NA 138 08/12/2012   K 4.4 08/12/2012   CL 105 08/12/2012   CO2 26 08/12/2012    No results found for this basename: HGBA1C   Lipid Panel  No results found for this basename: chol, trig, hdl, cholhdl, vldl, ldlcalc       Assessment and plan:   Patient Active Problem List   Diagnosis Date Noted  . BPH (benign prostatic hyperplasia) 08/27/2012  . Unspecified essential hypertension 08/27/2012       BPH Started on Flomax, added Proscar Urology appointment Urine analysis Chlamydia/gonorrhea probe Ultrasound of the prostrate Testicular ultrasound Followup in one month  Hypertension stable

## 2012-10-28 NOTE — Progress Notes (Signed)
Patient here for follow up Re check prostate

## 2012-10-29 LAB — URINALYSIS, ROUTINE W REFLEX MICROSCOPIC
Bilirubin Urine: NEGATIVE
Specific Gravity, Urine: 1.02 (ref 1.005–1.030)
Urobilinogen, UA: 1 mg/dL (ref 0.0–1.0)
pH: 5.5 (ref 5.0–8.0)

## 2012-10-29 LAB — GC/CHLAMYDIA PROBE AMP, URINE
Chlamydia, Swab/Urine, PCR: NEGATIVE
GC Probe Amp, Urine: NEGATIVE

## 2012-10-30 ENCOUNTER — Ambulatory Visit (HOSPITAL_COMMUNITY)
Admission: RE | Admit: 2012-10-30 | Discharge: 2012-10-30 | Disposition: A | Discharge status: Home / Self Care | Payer: No Typology Code available for payment source | Source: Ambulatory Visit | Attending: Internal Medicine | Admitting: Internal Medicine

## 2012-10-30 DIAGNOSIS — N433 Hydrocele, unspecified: Secondary | ICD-10-CM | POA: Insufficient documentation

## 2012-10-30 DIAGNOSIS — N5089 Other specified disorders of the male genital organs: Secondary | ICD-10-CM | POA: Insufficient documentation

## 2012-10-30 DIAGNOSIS — N400 Enlarged prostate without lower urinary tract symptoms: Secondary | ICD-10-CM | POA: Insufficient documentation

## 2012-10-30 IMAGING — US US SCROTUM
1 series · 13 of 25 positions shown · non-contrast
Comparison: Prior scrotal ultrasound 11/30/2004

CLINICAL DATA: Enlarged prostate, testicular swelling

ULTRASOUND OF SCROTUM
TECHNIQUE: Complete ultrasound examination of the testicles,
epididymis, and other scrotal structures was performed.

[Series 1: us scrotum · 0.07mm/px · 13 of 65 slices shown]
[im 1/65]
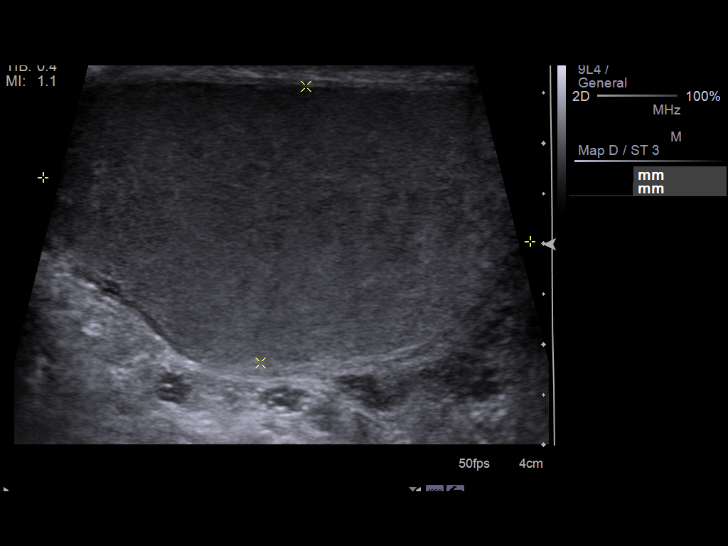
[im 6/65]
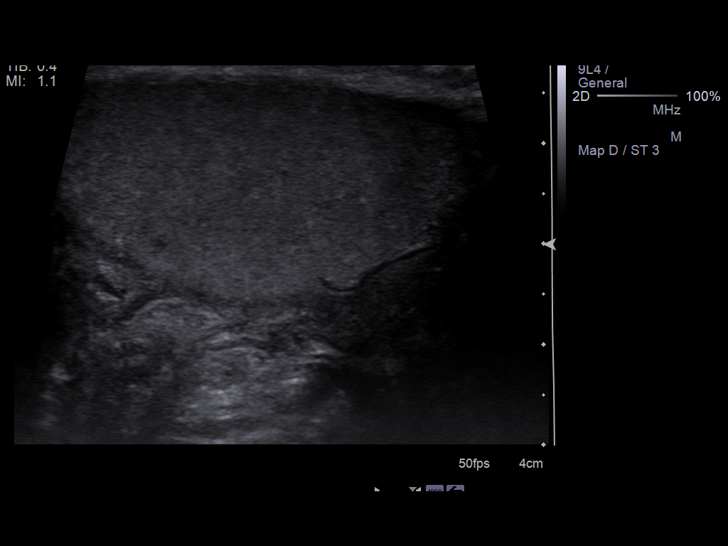
[im 11/65]
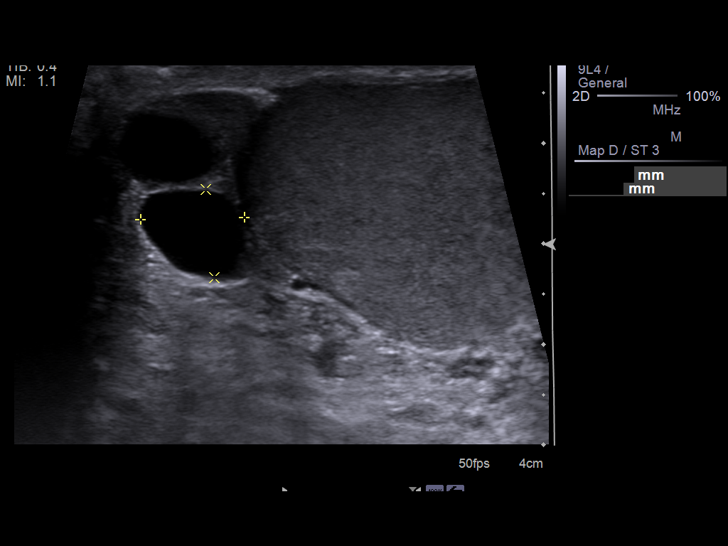
[im 17/65]
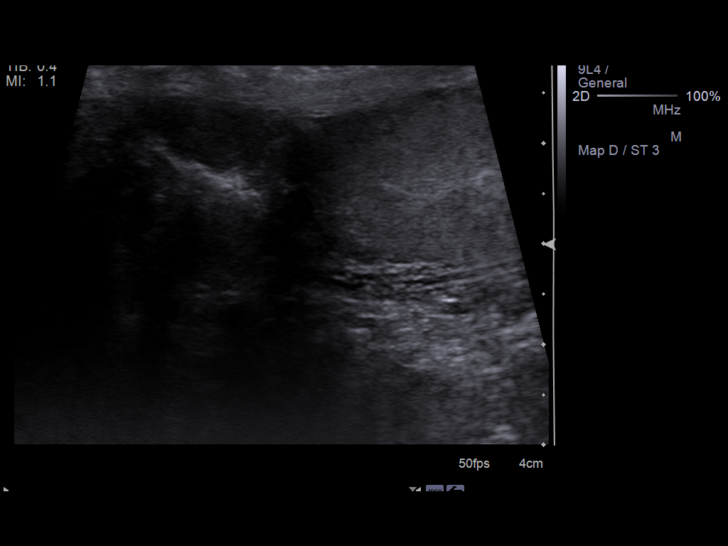
[im 22/65]
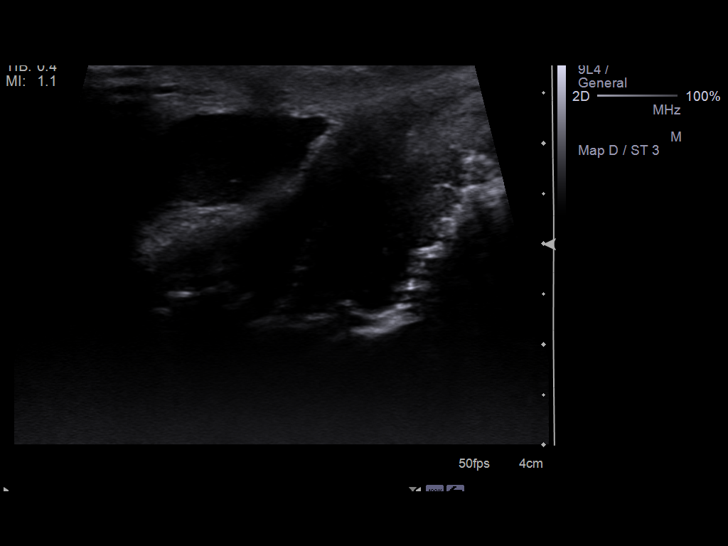
[im 27/65]
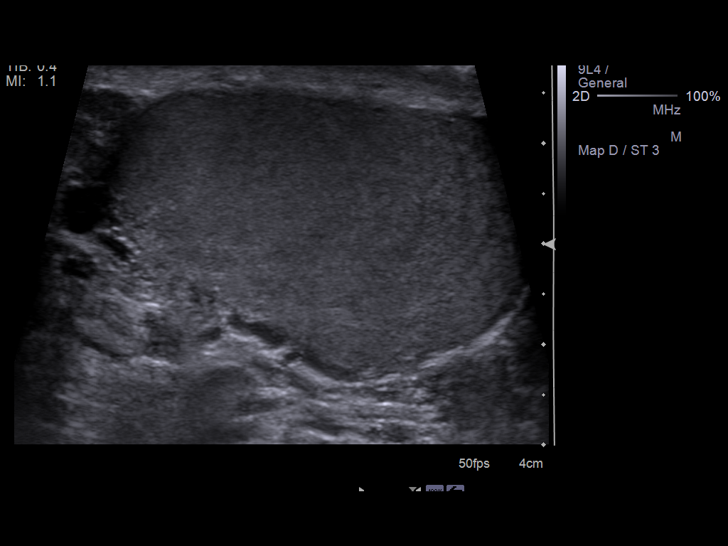
[im 33/65]
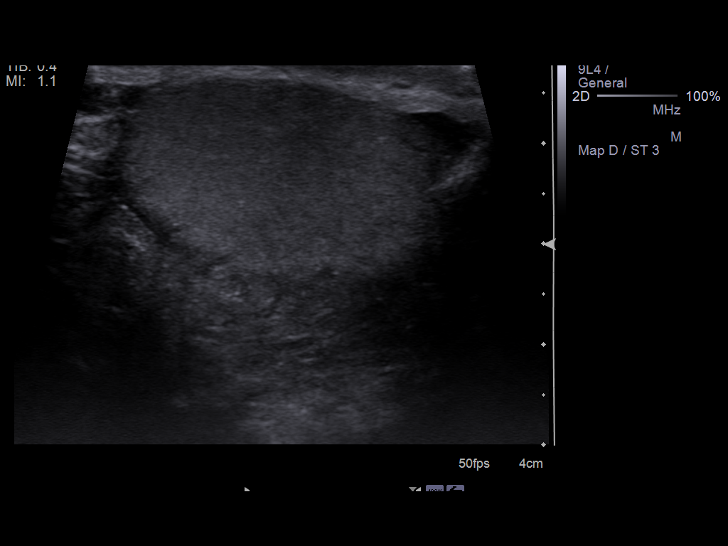
[im 38/65]
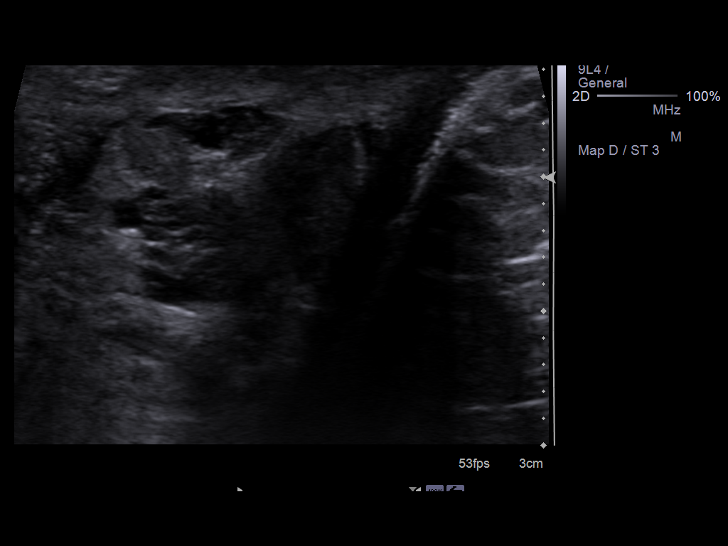
[im 43/65]
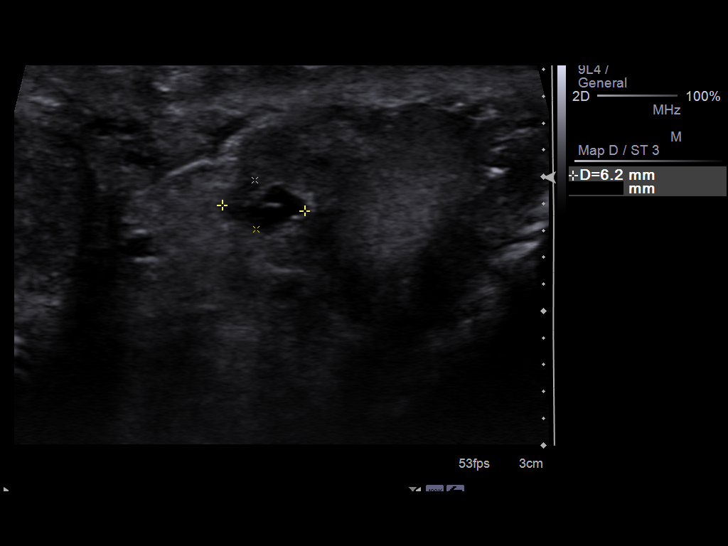
[im 49/65]
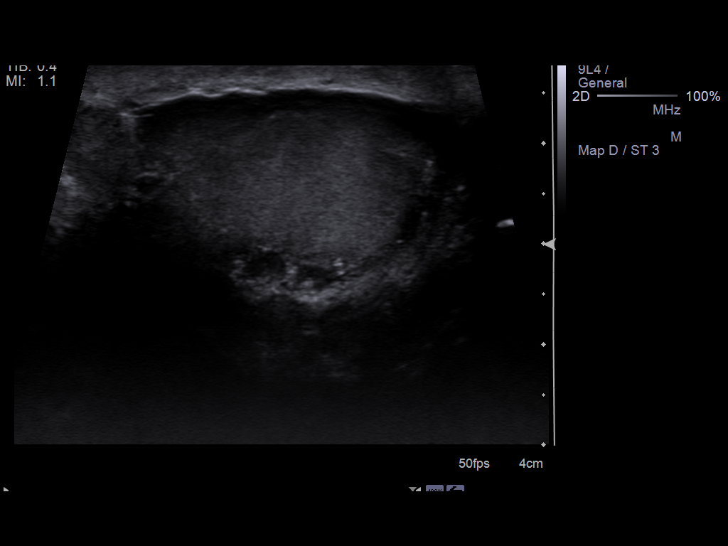
[im 54/65]
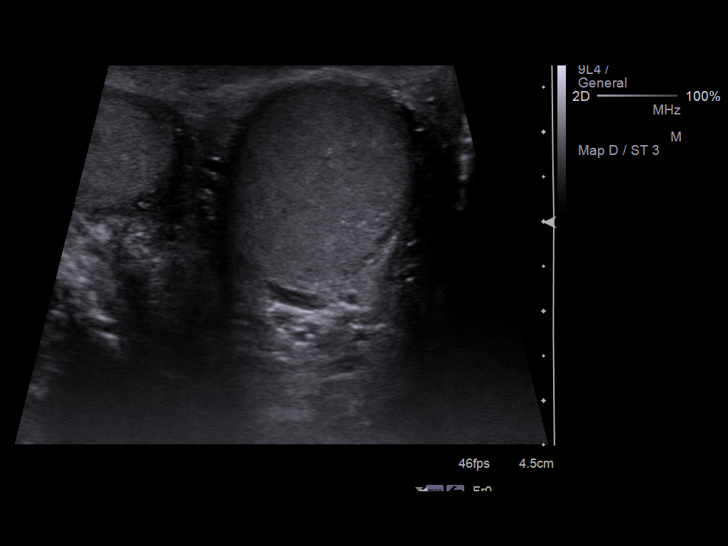
[im 59/65]
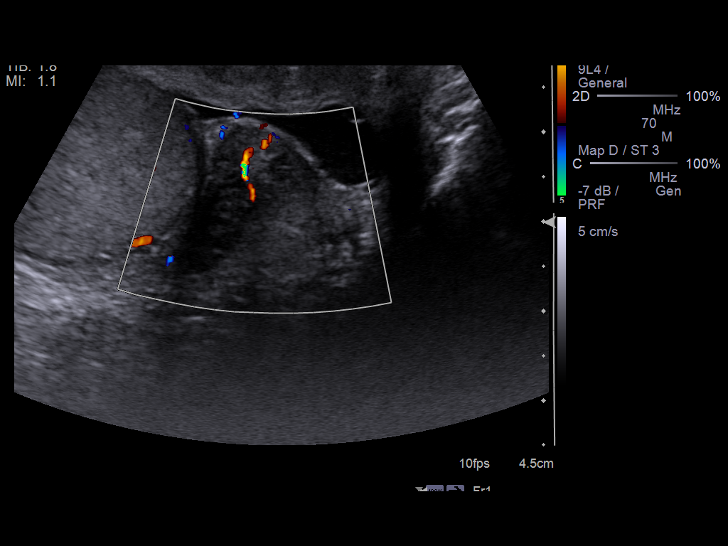
[im 65/65]
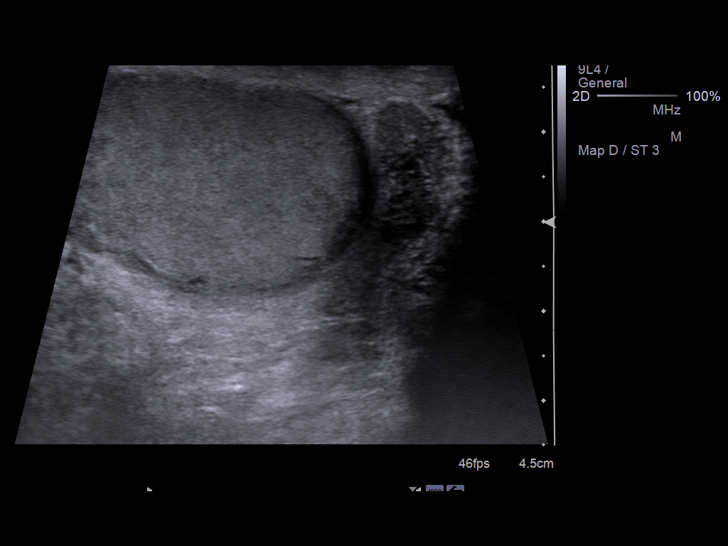

[13 of 25 positions shown; findings below may reference images not displayed]

FINDINGS: Right testis:  The right testis measures 4.9 x 2.8 x 2.7 cm
(previously 4.6 x 2.7 x 3.3 cm.  The testicular parenchyma is
homogeneous.  Both arterial and venous flow is demonstrated on
color Doppler imaging.  No focal mass lesion.

Left testis:  The left testis measures 4.2 x 2.6 and by 2.7 cm
compared to 4.1 x 2.5 x 3.2 5 cm previously.  The testicular
parenchyma is homogeneous.  Both arterial and venous flow is
demonstrated on color Doppler imaging.  No focal mass lesion.

Right epididymis:  A simple epididymal cyst versus spermatocele in
the epididymal head measures 11 x 10 x 12 mm.  A second adjacent
simple cyst versus spermatocele measures 10 x 9 x 9 mm.  These
cysts have slowly enlarged compared to 5116.

Left epididymis:  Small 4 x 5 x 6 mm minimally complex cyst versus
spermatocele in the epididymal head.  There is a mildly complex
cyst with thin internal septations also in the epididymal head
which measures 7 x 6 x 4 mm. The epididymal tail is slightly full
and may be minimally hypervascular relative to the rest of the
epididymis.

Hydrocele:  Trace bilateral hydroceles.

Varicocele:  Absent.
IMPRESSION: 1.  Mild fullness of the left epididymal tail with some increased
vascularity.  Findings suggest infectious or inflammatory
epididymitis.  Recommend clinical correlation for signs and
symptoms of pain in this region.

2.  Bilateral epididymal cysts versus spermatoceles are simple on
the right and slightly enlarged compared to 5116, and minimally
complex on the left.

3.  Trace bilateral hydroceles.

## 2012-11-28 ENCOUNTER — Ambulatory Visit: Payer: No Typology Code available for payment source | Attending: Internal Medicine | Admitting: Internal Medicine

## 2012-11-28 ENCOUNTER — Encounter: Payer: Self-pay | Admitting: Internal Medicine

## 2012-11-28 VITALS — BP 124/87 | HR 68 | Temp 98.1°F | Resp 16 | Ht 65.35 in | Wt 204.0 lb

## 2012-11-28 DIAGNOSIS — N400 Enlarged prostate without lower urinary tract symptoms: Secondary | ICD-10-CM

## 2012-11-28 DIAGNOSIS — I10 Essential (primary) hypertension: Secondary | ICD-10-CM

## 2012-11-28 DIAGNOSIS — N453 Epididymo-orchitis: Secondary | ICD-10-CM

## 2012-11-28 DIAGNOSIS — N451 Epididymitis: Secondary | ICD-10-CM | POA: Insufficient documentation

## 2012-11-28 MED ORDER — LEVOFLOXACIN 500 MG PO TABS: 500.0000 mg | ORAL_TABLET | Freq: Every day | ORAL | Status: DC

## 2012-11-28 MED ORDER — TAMSULOSIN HCL 0.4 MG PO CAPS: 0.4000 mg | ORAL_CAPSULE | Freq: Every day | ORAL | Status: DC

## 2012-11-28 MED ORDER — AMLODIPINE BESYLATE 10 MG PO TABS: 10.0000 mg | ORAL_TABLET | Freq: Every day | ORAL | Status: DC

## 2012-11-28 MED ORDER — FINASTERIDE 5 MG PO TABS: 5.0000 mg | ORAL_TABLET | Freq: Every day | ORAL | Status: DC

## 2012-11-28 MED ORDER — LISINOPRIL 40 MG PO TABS: 40.0000 mg | ORAL_TABLET | Freq: Every day | ORAL | Status: DC

## 2012-11-28 NOTE — Progress Notes (Signed)
Follow up visit. Here to go over labs only

## 2012-11-28 NOTE — Progress Notes (Signed)
Patient ID: Jason [facility], male   DOB: 04/01/1950, 62 y.o.   MRN: [id number]  CC: Follow up  HPI: Jason Rhodes is a 62 year old gentleman who came into clinic today for followup visit. He was recently seen in our clinic and sent for scrotal ultrasound. He wants to know the results as well as the STD check. STD results were negative for chlamydia and GC, scrotal ultrasound was reviewed with patient, signs of inflammation and infection noted, with mild bilateral hydrocele. Patient claims the pain persists more on the right side and especially with erection, no fever, no penile discharge, no pain on defecation. Patient claims to be compliant with medications, frequency is better, no urgency.  No Known Allergies No past medical history on file. Current Outpatient Prescriptions on File Prior to Visit  Medication Sig Dispense Refill  . naproxen (NAPROSYN) 500 MG tablet Take 1 tablet (500 mg total) by mouth 2 (two) times daily as needed.  60 tablet  1   No current facility-administered medications on file prior to visit.   No family history on file. History   Social History  . Marital Status: Single    Spouse Name: N/A    Number of Children: N/A  . Years of Education: N/A   Occupational History  . Not on file.   Social History Main Topics  . Smoking status: Never Smoker   . Smokeless tobacco: Not on file  . Alcohol Use: No  . Drug Use:   . Sexual Activity:    Other Topics Concern  . Not on file   Social History Narrative  . No narrative on file    Review of Systems: Constitutional: Negative for fever, chills, diaphoresis, activity change, appetite change and fatigue. HENT: Negative for ear pain, nosebleeds, congestion, facial swelling, rhinorrhea, neck pain, neck stiffness and ear discharge.  Eyes: Negative for pain, discharge, redness, itching and visual disturbance. Respiratory: Negative for cough, choking, chest tightness, shortness of breath, wheezing and stridor.  Cardiovascular:  Negative for chest pain, palpitations and leg swelling. Gastrointestinal: Negative for abdominal distention. Genitourinary: Negative for dysuria, + urgency, frequency,  No hematuria, flank pain, decreased urine volume Musculoskeletal: Negative for back pain, joint swelling, arthralgias and gait problem. Neurological: Negative for dizziness, tremors, seizures, syncope, facial asymmetry, speech difficulty, weakness, light-headedness, numbness and headaches.  Hematological: Negative for adenopathy. Does not bruise/bleed easily. Psychiatric/Behavioral: Negative for hallucinations, behavioral problems, confusion, dysphoric mood, decreased concentration and agitation.    Objective:   Filed Vitals:   11/28/12 1029  BP: 124/87  Pulse: 68  Temp: 98.1 F (36.7 C)  Resp: 16    Physical Exam: Constitutional: Patient appears well-developed and well-nourished. No distress. HENT: Normocephalic, atraumatic, External right and left ear normal. Oropharynx is clear and moist.  Eyes: Conjunctivae and EOM are normal. PERRLA, no scleral icterus. Neck: Normal ROM. Neck supple. No JVD. No tracheal deviation. No thyromegaly. CVS: RRR, S1/S2 +, no murmurs, no gallops, no carotid bruit.  Pulmonary: Effort and breath sounds normal, no stridor, rhonchi, wheezes, rales.  Abdominal: Soft. BS +,  no distension, tenderness, rebound or guarding.  Musculoskeletal: Normal range of motion. No edema and no tenderness.  Lymphadenopathy: No lymphadenopathy noted, cervical, inguinal or axillary Neuro: Alert. Normal reflexes, muscle tone coordination. No cranial nerve deficit. Skin: Skin is warm and dry. No rash noted. Not diaphoretic. No erythema. No pallor. Psychiatric: Normal mood and affect. Behavior, judgment, thought content normal.  Genital Exam: Normal male external genitalia, tender epididymis mostly on the right, with  bulky feeling, right testicle also tender Lab Results  Component Value Date   WBC 4.7  08/12/2012   HGB 12.5* 08/12/2012   HCT 37.3* 08/12/2012   MCV 89.2 08/12/2012   PLT 258 08/12/2012   Lab Results  Component Value Date   CREATININE 1.00 08/12/2012   BUN 13 08/12/2012   NA 138 08/12/2012   K 4.4 08/12/2012   CL 105 08/12/2012   CO2 26 08/12/2012    No results found for this basename: HGBA1C   Lipid Panel  No results found for this basename: chol, trig, hdl, cholhdl, vldl, ldlcalc       Assessment and plan:   Patient Active Problem List   Diagnosis Date Noted  . Epididymitis 11/28/2012  . BPH (benign prostatic hyperplasia) 08/27/2012  . Unspecified essential hypertension 08/27/2012   Levofloxacin 500 mg tablet by mouth daily for 10 days for epididymitis Refill the following medications: Lisinopril 40 mg tablet by mouth daily Amlodipine 10 mg tablet by mouth daily Tamsulosin 0.4 mg tablet by mouth daily Finasteride 5 mg tablet by mouth daily  Ambulatory referral to urologist for benign prostatic hypertrophy Patient extensively counseled  [facility] Rhodes was given clear instructions to go to ER or return to the clinic if symptoms don't improve, worsen or new problems develop.  [facility] verbalized understanding.  [facility] was told to call to get lab results if hasn't heard anything in the next week.        Jeanann Lewandowsky, MD [facility] [place], [place] [phone]   11/28/2012, 10:58 AM

## 2012-12-09 ENCOUNTER — Encounter: Payer: Self-pay | Admitting: Family Medicine

## 2012-12-09 ENCOUNTER — Ambulatory Visit (INDEPENDENT_AMBULATORY_CARE_PROVIDER_SITE_OTHER): Payer: No Typology Code available for payment source | Admitting: Family Medicine

## 2012-12-09 VITALS — BP 125/75 | HR 64 | Temp 98.5°F | Wt 201.8 lb

## 2012-12-09 DIAGNOSIS — N400 Enlarged prostate without lower urinary tract symptoms: Secondary | ICD-10-CM

## 2012-12-09 DIAGNOSIS — N451 Epididymitis: Secondary | ICD-10-CM

## 2012-12-09 DIAGNOSIS — N453 Epididymo-orchitis: Secondary | ICD-10-CM

## 2012-12-09 DIAGNOSIS — Z0000 Encounter for general adult medical examination without abnormal findings: Secondary | ICD-10-CM

## 2012-12-09 DIAGNOSIS — I10 Essential (primary) hypertension: Secondary | ICD-10-CM

## 2012-12-09 LAB — CBC WITH DIFFERENTIAL/PLATELET
HCT: 37 % — ABNORMAL LOW (ref 39.0–52.0)
Hemoglobin: 12.6 g/dL — ABNORMAL LOW (ref 13.0–17.0)
Lymphocytes Relative: 41 % (ref 12–46)
Lymphs Abs: 2 10*3/uL (ref 0.7–4.0)
Monocytes Absolute: 0.4 10*3/uL (ref 0.1–1.0)
Monocytes Relative: 8 % (ref 3–12)
Neutro Abs: 2.4 10*3/uL (ref 1.7–7.7)
Neutrophils Relative %: 50 % (ref 43–77)
RBC: 4.18 MIL/uL — ABNORMAL LOW (ref 4.22–5.81)

## 2012-12-09 LAB — COMPREHENSIVE METABOLIC PANEL
ALT: 26 U/L (ref 0–53)
BUN: 10 mg/dL (ref 6–23)
CO2: 29 mEq/L (ref 19–32)
Calcium: 9.5 mg/dL (ref 8.4–10.5)
Chloride: 103 mEq/L (ref 96–112)
Creat: 1.15 mg/dL (ref 0.50–1.35)
Total Bilirubin: 0.6 mg/dL (ref 0.3–1.2)

## 2012-12-09 LAB — POCT GLYCOSYLATED HEMOGLOBIN (HGB A1C): Hemoglobin A1C: 5

## 2012-12-09 MED ORDER — NAPROXEN 500 MG PO TABS: 500.0000 mg | ORAL_TABLET | Freq: Two times a day (BID) | ORAL | Status: DC | PRN

## 2012-12-09 NOTE — Assessment & Plan Note (Addendum)
Seen on ultrasound August; Symptoms improving, s/p levaquin x 10 days, afebrile - Follow-up with urologist as scheduled 12/27/12; ROI signed - Return precautions reviewed - CBC today **Nl WBC but incidentally found mild anemia. Recheck in 6 months or sooner / iron studies if symptomatic or reports bleeding. Sent letter.**

## 2012-12-09 NOTE — Assessment & Plan Note (Signed)
Well-controlled on norvasc and lisinopril. - CMET today

## 2012-12-09 NOTE — Progress Notes (Signed)
Patient ID: Jason Rhodes, male   DOB: July 06, 1950, 62 y.o.   MRN: [id number] Subjective:   CC: new patient appt  HPI:  Patient is switching from Dr [facility] at [facility] due to his wife being seen at [facility]. Current concerns include:  1. Prostate issue- Patient had a few months of decreased ability to start/keep stream and decreased force of stream, so his PCP at that time started finasteride and flomax and patient noticed improvement since that time. A little over 1 month ago he started having scrotal pain and scrotal US showed epididymitis and bilateral epididymal cysts/spermatoceles. He was given 10 days of levaquin (last dose 9/11), which helped with the pain/swelling in the testicle, though some pain remains. He denies current redness or discharge, hematuria, fevers, or chills. He has appt Oct 3 with [facility] to further evaluate.   Scrotal US 8/6: 1. Mild fullness of the left epididymal tail with some increased  vascularity. Findings suggest infectious or inflammatory  epididymitis. Recommend clinical correlation for signs and  symptoms of pain in this region.  2. Bilateral epididymal cysts versus spermatoceles are simple on  the right and slightly enlarged compared to 2006, and minimally  complex on the left.  3. Trace bilateral hydroceles.   Review of Systems - Per HPI. Additionally, denies unexplained weight loss, night sweats, bloody or consistent cough.  SH: Exercises: joined the [facility] 30 years ago, drank on weekends 30 years ago - Denies drug use - Lives with wife and youngest son - Denies violence at home - Works as Event organiser for delivery   PMH: - No kidney stone or UTI hx - Pt has sickle cell trait  FH: Pt is not certain - Father obesity, died from complications of injury/gangrene - 4 siblings: 1 sister died as child, uncertain how; 1 brother has DM; 1 brother has cataracts - 3 children: daughter overweight, HTN; son healthy, treated in past  for kidney stone - Paternal grandmother with glaucoma, lived to 65s - Niece - sickle cell disease- - Multiple family members with positive TB tests   Objective:  Physical Exam BP 125/75  Pulse 64  Temp(Src) 98.5 F (36.9 C) (Oral)  Wt 201 lb 12.8 oz (91.536 kg)  BMI 33.22 kg/m2 GEN: NAD, pleasant HEENT: Atraumatic, normocephalic, neck supple, EOMI, sclera clear  PULM: normal effort SKIN: No rash or cyanosis; warm and well-perfused EXTR: No lower extremity edema PSYCH: Mood and affect euthymic, normal rate and volume of speech NEURO: Awake, alert, no focal deficits grossly, normal speech GU - Deferred due to symptoms improving     Assessment:     Jason Rhodes is a 62 y.o. male here for establishing care.    Plan:     # See problem list for problem-specific plans. - Return for health maintenance visit: f/u positive TB skin test (reported), check lipid panel, check hearing - Follow up on h/o positive TB skin test (reported) - A1c today - Colonoscopy last year at [facility], polyps removed per patient - redo in 3 years   Jason Singleton, MD

## 2012-12-09 NOTE — Patient Instructions (Addendum)
Good to see you today.  Come back to see me when convenient for you for a office visit. If you can make it first thing in the morning and are fasting (no milk/cream/food), we can check lipids. Otherwise, we can have you come back for lab only visit after that appt.  I am checking labs today and we can discuss when you come back.  Please follow up with urology as scheduled, and come in sooner if you develop any concerns beforehand (fever, worsened testicular pain).    Health Maintenance, Males A healthy lifestyle and preventative care can promote health and wellness.  Maintain regular health, dental, and eye exams.  Eat a healthy diet. Foods like vegetables, fruits, whole grains, low-fat dairy products, and lean protein foods contain the nutrients you need without too many calories. Decrease your intake of foods high in solid fats, added sugars, and salt. Get information about a proper diet from your caregiver, if necessary.  Regular physical exercise is one of the most important things you can do for your health. Most adults should get at least 150 minutes of moderate-intensity exercise (any activity that increases your heart rate and causes you to sweat) each week. In addition, most adults need muscle-strengthening exercises on 2 or more days a week.   Maintain a healthy weight. The body mass index (BMI) is a screening tool to identify possible weight problems. It provides an estimate of body fat based on height and weight. Your caregiver can help determine your BMI, and can help you achieve or maintain a healthy weight. For adults 20 years and older:  A BMI below 18.5 is considered underweight.  A BMI of 18.5 to 24.9 is normal.  A BMI of 25 to 29.9 is considered overweight.  A BMI of 30 and above is considered obese.  Maintain normal blood lipids and cholesterol by exercising and minimizing your intake of saturated fat. Eat a balanced diet with plenty of fruits and vegetables. Blood  tests for lipids and cholesterol should begin at age 58 and be repeated every 5 years. If your lipid or cholesterol levels are high, you are over 50, or you are a high risk for heart disease, you may need your cholesterol levels checked more frequently.Ongoing high lipid and cholesterol levels should be treated with medicines, if diet and exercise are not effective.  If you smoke, find out from your caregiver how to quit. If you do not use tobacco, do not start.  If you choose to drink alcohol, do not exceed 2 drinks per day. One drink is considered to be 12 ounces (355 mL) of beer, 5 ounces (148 mL) of wine, or 1.5 ounces (44 mL) of liquor.  Avoid use of street drugs. Do not share needles with anyone. Ask for help if you need support or instructions about stopping the use of drugs.  High blood pressure causes heart disease and increases the risk of stroke. Blood pressure should be checked at least every 1 to 2 years. Ongoing high blood pressure should be treated with medicines if weight loss and exercise are not effective.  If you are 51 to 63 years old, ask your caregiver if you should take aspirin to prevent heart disease.  Diabetes screening involves taking a blood sample to check your fasting blood sugar level. This should be done once every 3 years, after age 46, if you are within normal weight and without risk factors for diabetes. Testing should be considered at a younger age or  be carried out more frequently if you are overweight and have at least 1 risk factor for diabetes.  Colorectal cancer can be detected and often prevented. Most routine colorectal cancer screening begins at the age of 43 and continues through age 64. However, your caregiver may recommend screening at an earlier age if you have risk factors for colon cancer. On a yearly basis, your caregiver may provide home test kits to check for hidden blood in the stool. Use of a small camera at the end of a tube, to directly examine  the colon (sigmoidoscopy or colonoscopy), can detect the earliest forms of colorectal cancer. Talk to your caregiver about this at age 94, when routine screening begins. Direct examination of the colon should be repeated every 5 to 10 years through age 60, unless early forms of pre-cancerous polyps or small growths are found.  Hepatitis C blood testing is recommended for all people born from [birth year] through 1965 and any individual with known risks for hepatitis C.  Healthy men should no longer receive prostate-specific antigen (PSA) blood tests as part of routine cancer screening. Consult with your caregiver about prostate cancer screening.  Testicular cancer screening is not recommended for adolescents or adult males who have no symptoms. Screening includes self-exam, caregiver exam, and other screening tests. Consult with your caregiver about any symptoms you have or any concerns you have about testicular cancer.  Practice safe sex. Use condoms and avoid high-risk sexual practices to reduce the spread of sexually transmitted infections (STIs).  Use sunscreen with a sun protection factor (SPF) of 30 or greater. Apply sunscreen liberally and repeatedly throughout the day. You should seek shade when your shadow is shorter than you. Protect yourself by wearing long sleeves, pants, a wide-brimmed hat, and sunglasses year round, whenever you are outdoors.  Notify your caregiver of new moles or changes in moles, especially if there is a change in shape or color. Also notify your caregiver if a mole is larger than the size of a pencil eraser.  A one-time screening for abdominal aortic aneurysm (AAA) and surgical repair of large AAAs by sound wave imaging (ultrasonography) is recommended for ages 93 to 10 years who are current or former smokers.  Stay current with your immunizations. Document Released: 09/09/2007 Document Revised: 06/05/2011 Document Reviewed: 08/08/2010 [facility] Patient Information 2014  [place], [place].

## 2012-12-09 NOTE — Assessment & Plan Note (Addendum)
Symptoms improving. Continue finasteride and flomax.  - F/u with urology scheduled.

## 2012-12-13 ENCOUNTER — Encounter: Payer: Self-pay | Admitting: Family Medicine

## 2012-12-13 DIAGNOSIS — D649 Anemia, unspecified: Secondary | ICD-10-CM

## 2012-12-22 DIAGNOSIS — D649 Anemia, unspecified: Secondary | ICD-10-CM | POA: Insufficient documentation

## 2013-01-08 ENCOUNTER — Encounter: Payer: Self-pay | Admitting: Family Medicine

## 2013-01-08 ENCOUNTER — Ambulatory Visit (HOSPITAL_COMMUNITY)
Admission: RE | Admit: 2013-01-08 | Discharge: 2013-01-08 | Disposition: A | Discharge status: Home / Self Care | Payer: No Typology Code available for payment source | Source: Ambulatory Visit | Attending: Family Medicine | Admitting: Family Medicine

## 2013-01-08 ENCOUNTER — Ambulatory Visit (INDEPENDENT_AMBULATORY_CARE_PROVIDER_SITE_OTHER): Payer: No Typology Code available for payment source | Admitting: Family Medicine

## 2013-01-08 VITALS — BP 151/83 | HR 61 | Temp 98.2°F | Wt 204.0 lb

## 2013-01-08 DIAGNOSIS — I10 Essential (primary) hypertension: Secondary | ICD-10-CM

## 2013-01-08 DIAGNOSIS — R7611 Nonspecific reaction to tuberculin skin test without active tuberculosis: Secondary | ICD-10-CM

## 2013-01-08 DIAGNOSIS — Z23 Encounter for immunization: Secondary | ICD-10-CM

## 2013-01-08 DIAGNOSIS — Z0000 Encounter for general adult medical examination without abnormal findings: Secondary | ICD-10-CM

## 2013-01-08 DIAGNOSIS — N400 Enlarged prostate without lower urinary tract symptoms: Secondary | ICD-10-CM

## 2013-01-08 DIAGNOSIS — D573 Sickle-cell trait: Secondary | ICD-10-CM

## 2013-01-08 HISTORY — DX: Nonspecific reaction to tuberculin skin test without active tuberculosis: R76.11

## 2013-01-08 LAB — LIPID PANEL
Cholesterol: 161 mg/dL (ref 0–200)
HDL: 50 mg/dL (ref >39)
LDL Cholesterol: 97 mg/dL (ref 0–99)
Total CHOL/HDL Ratio: 3.2 Ratio
Triglycerides: 69 mg/dL (ref <150)
VLDL: 14 mg/dL (ref 0–40)

## 2013-01-08 IMAGING — CR DG CHEST 2V
2 series · 2 of 2 positions shown · non-contrast
Comparison: 01/04/2008

CLINICAL DATA: Hypertension. Positive TB test.

EXAM:
CHEST  2 VIEW

[w chest pa]
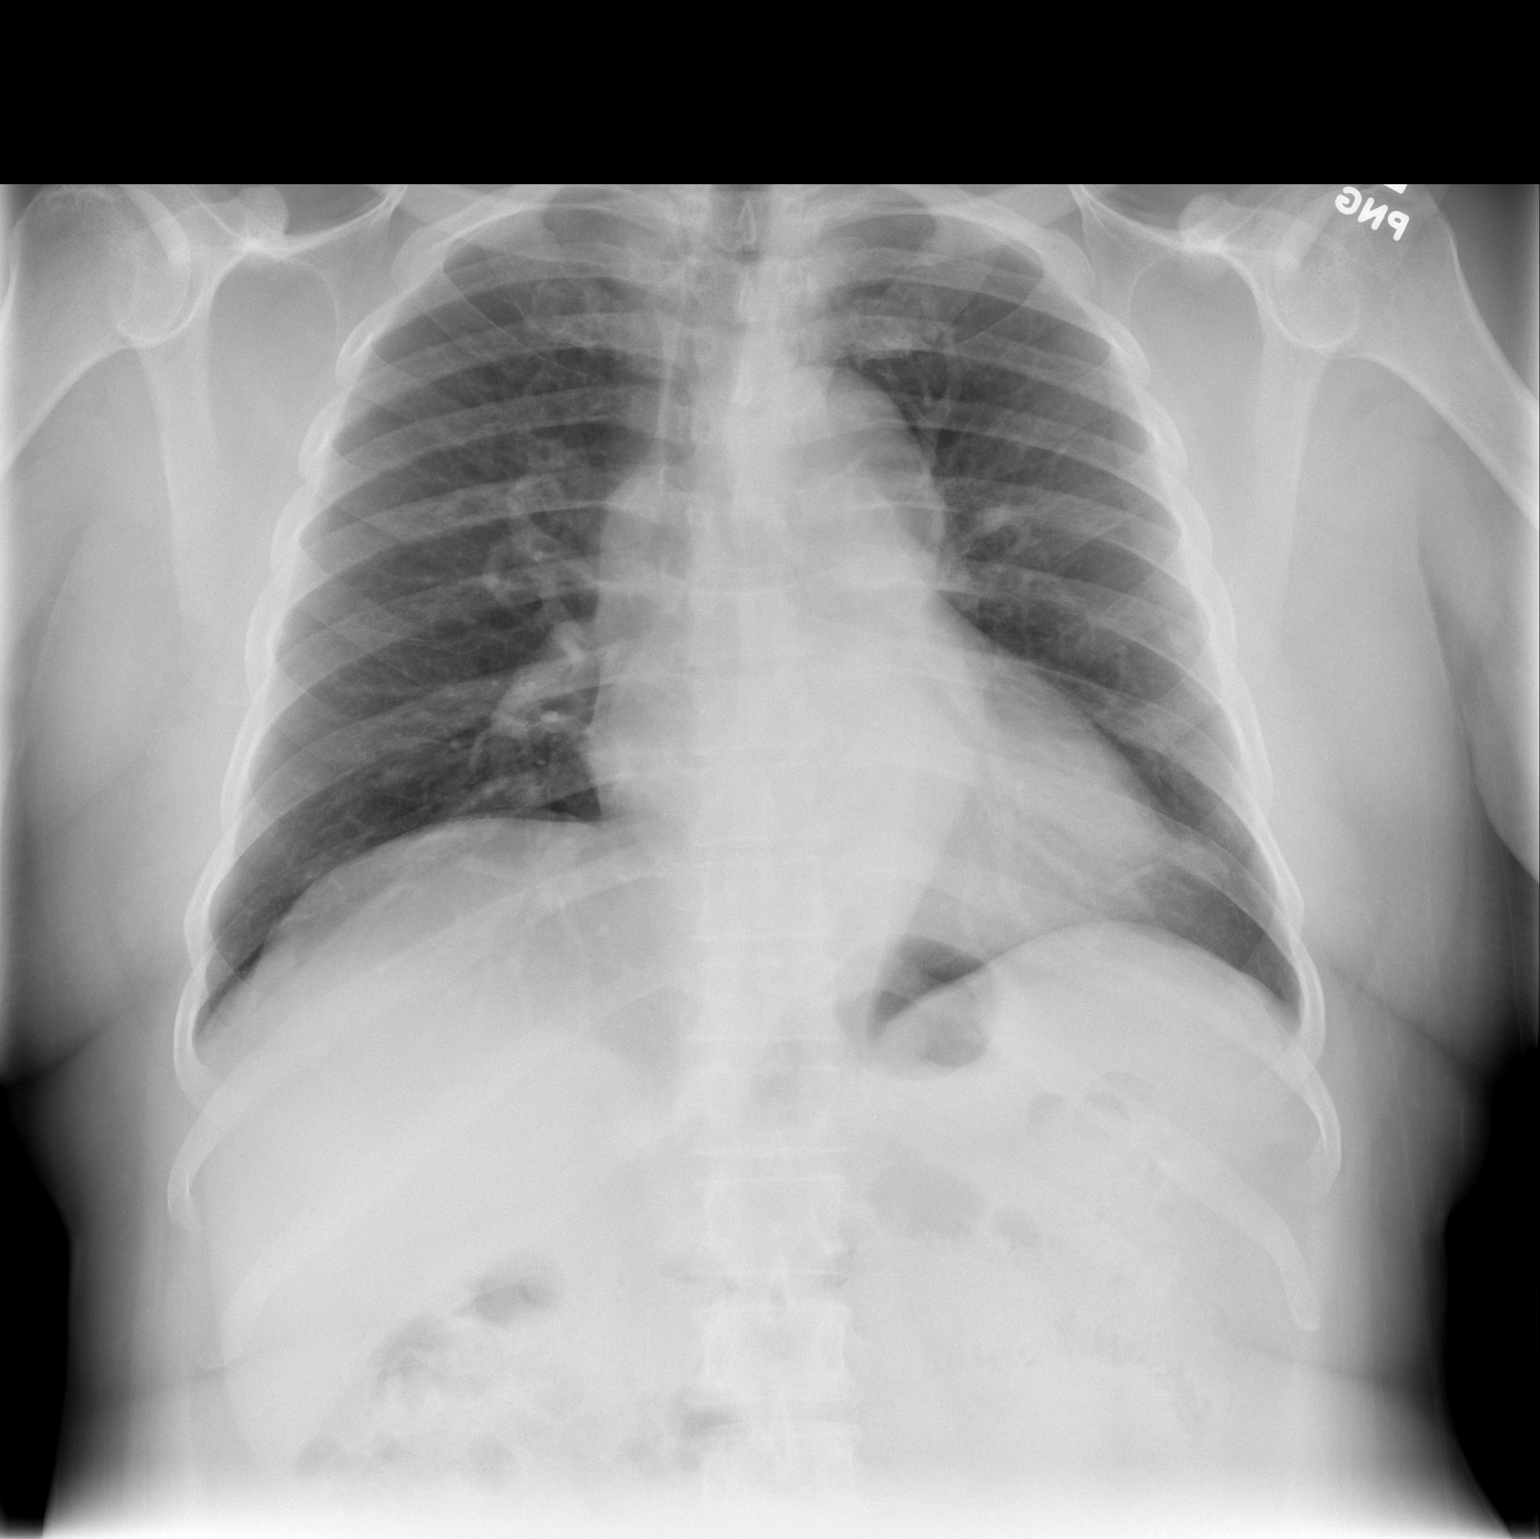

[w chest lat]
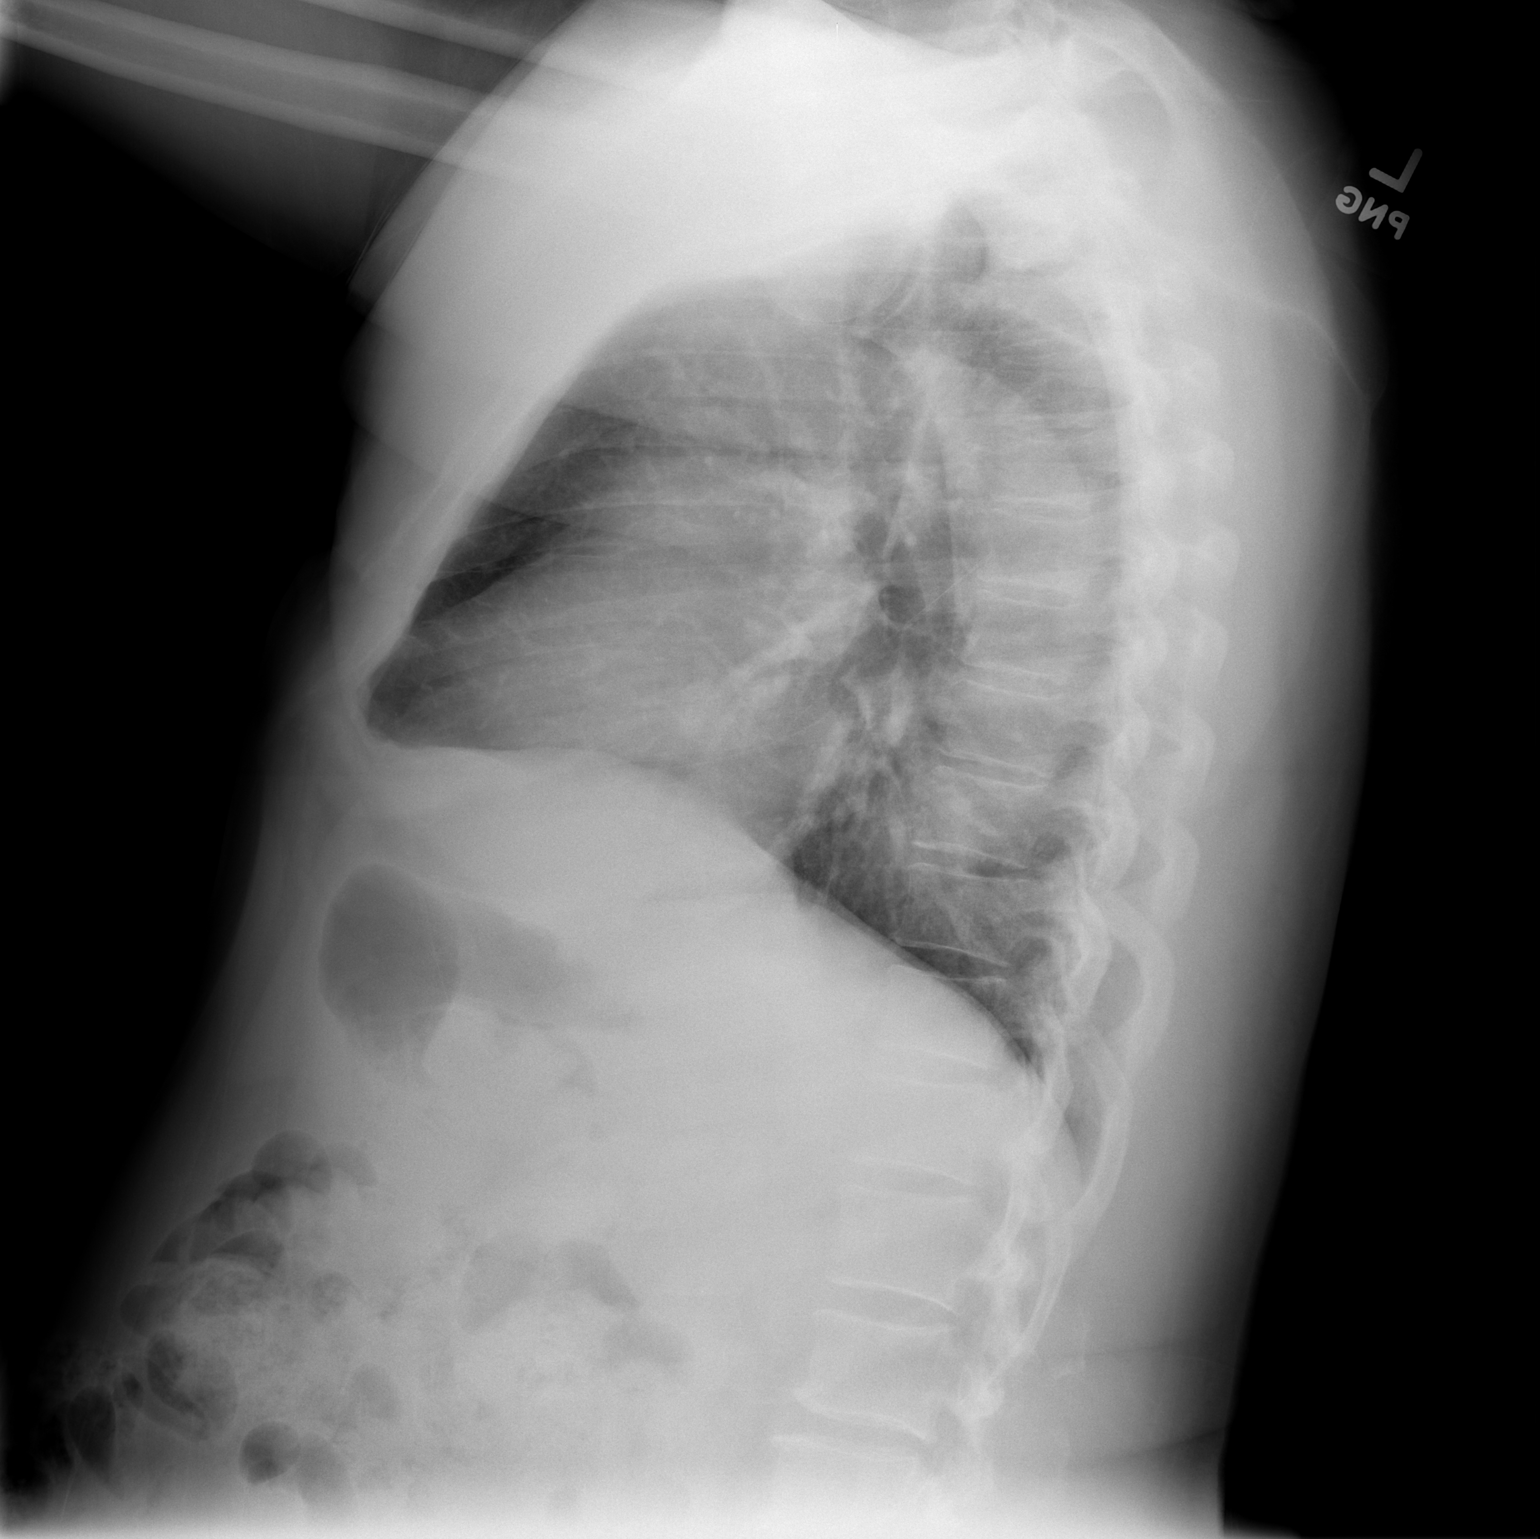

[2 of 2 positions shown; findings below may reference images not displayed]

FINDINGS: Mild right hemidiaphragm elevation. Midline trachea. Normal heart
size with a tortuous thoracic aorta. No pleural effusion or
pneumothorax. There may be mild scarring at the left lung base.
IMPRESSION: No acute cardiopulmonary disease.

## 2013-01-08 NOTE — Progress Notes (Addendum)
Patient ID: Jason Rhodes, male   DOB: 08/24/1950, 62 y.o.   MRN: [id number] Subjective:   CC: Physical  HPI:   1. Physical exam: Patient's only concern today is his urology visit (see below). Otherwise, he reports feeling well. Denies chest pain or shortness of breath.  - Would like flu shot today - Does not remember when his last tetanus shot was so would like that today - Would like lipids checked - he has not eaten - Got STD check at [facility] 1 month ago per pt report and all were negative  2. Urology follow-up - Patient reports that his urologist did PSA that s/he is going to use to decide if patient needs surgery or not. They plan to have him back at the end of the month. Oct 31 he will get another ultrasound to decide medication vs surgery. Pt reports never having had cancer.  3. BP - Patient did not take his BP medication this morning but usually takes it every day. Does not report any side effects.  4. 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 Pt denies chest pain, shortness of breath, cough, bloody sputum, fever, chills, night sweats, or weight loss. He thinks he had a chest x-ray a few months ago (last in system is 12/2007 - IMPRESSION: No acute cardiopulmonary abnormality.)   Review of Systems - Per HPI. Additionally, dysuria and ringing in ears.   FH: Parents deceased but had diabetes and HTN issues. Sickle cell runs in family - pt has [place] trait [place], last time 13 years ago.  SH:  Uses treadmill regularly Never drinks etoh Has not used drugs Feels safe in relationship   Objective:  Physical Exam BP 151/83  Pulse 61  Temp(Src) 98.2 F (36.8 C) (Oral)  Wt 204 lb (92.534 kg)  BMI 33.58 kg/m2 GEN: NAD, pleasant African male CV: RRR, no m/r/g, distant PULM: CTAB, no wheezes or crackles, normal effort ABD: S/NT/ND, NABS, obese EXTR: No LE edema  or asymmetry or erythema NEURO: Nonfocal, normal speech HEENT: TMs WNL, canal dry with mild erythema but no exudate, no tenderness on movement of pinna    Assessment:     [facility] is a 62 y.o. male here for physical exam    Plan:     # See problem list for problem-specific plans. - flu shot, tetanus shot, lipids today (fasting) - Ask aout tobacco use at f/u

## 2013-01-08 NOTE — Patient Instructions (Addendum)
It was good to see you today.  For your health maintenance, we are doing a flu shot, tetanus shot, and checking cholesterol today.  Chest x-ray ordered. Follow up with me in 1-2 weeks to discuss results.  For your prostate, follow up as recommended with urology.  If you develop any concerning symptoms including chest pain, shortness of breath, fevers, or chills, seek immediate care.  Continue taking BP medications daily and letting me know 2 weeks in advance before you run out. Continue working on keeping weight down.  For your ears, try acetic acid solution and follow up with me if no better.  Leona Singleton, MD   Health Maintenance, Males A healthy lifestyle and preventative care can promote health and wellness.  Maintain regular health, dental, and eye exams.  Eat a healthy diet. Foods like vegetables, fruits, whole grains, low-fat dairy products, and lean protein foods contain the nutrients you need without too many calories. Decrease your intake of foods high in solid fats, added sugars, and salt. Get information about a proper diet from your caregiver, if necessary.  Regular physical exercise is one of the most important things you can do for your health. Most adults should get at least 150 minutes of moderate-intensity exercise (any activity that increases your heart rate and causes you to sweat) each week. In addition, most adults need muscle-strengthening exercises on 2 or more days a week.   Maintain a healthy weight. The body mass index (BMI) is a screening tool to identify possible weight problems. It provides an estimate of body fat based on height and weight. Your caregiver can help determine your BMI, and can help you achieve or maintain a healthy weight. For adults 20 years and older:  A BMI below 18.5 is considered underweight.  A BMI of 18.5 to 24.9 is normal.  A BMI of 25 to 29.9 is considered overweight.  A BMI of 30 and above is considered  obese.  Maintain normal blood lipids and cholesterol by exercising and minimizing your intake of saturated fat. Eat a balanced diet with plenty of fruits and vegetables. Blood tests for lipids and cholesterol should begin at age 41 and be repeated every 5 years. If your lipid or cholesterol levels are high, you are over 50, or you are a high risk for heart disease, you may need your cholesterol levels checked more frequently.Ongoing high lipid and cholesterol levels should be treated with medicines, if diet and exercise are not effective.  If you smoke, find out from your caregiver how to quit. If you do not use tobacco, do not start.  If you choose to drink alcohol, do not exceed 2 drinks per day. One drink is considered to be 12 ounces (355 mL) of beer, 5 ounces (148 mL) of wine, or 1.5 ounces (44 mL) of liquor.  Avoid use of street drugs. Do not share needles with anyone. Ask for help if you need support or instructions about stopping the use of drugs.  High blood pressure causes heart disease and increases the risk of stroke. Blood pressure should be checked at least every 1 to 2 years. Ongoing high blood pressure should be treated with medicines if weight loss and exercise are not effective.  If you are 33 to 63 years old, ask your caregiver if you should take aspirin to prevent heart disease.  Diabetes screening involves taking a blood sample to check your fasting blood sugar level. This should be done once every 3 years, after  age 67, if you are within normal weight and without risk factors for diabetes. Testing should be considered at a younger age or be carried out more frequently if you are overweight and have at least 1 risk factor for diabetes.  Colorectal cancer can be detected and often prevented. Most routine colorectal cancer screening begins at the age of 69 and continues through age 40. However, your caregiver may recommend screening at an earlier age if you have risk factors for  colon cancer. On a yearly basis, your caregiver may provide home test kits to check for hidden blood in the stool. Use of a small camera at the end of a tube, to directly examine the colon (sigmoidoscopy or colonoscopy), can detect the earliest forms of colorectal cancer. Talk to your caregiver about this at age 44, when routine screening begins. Direct examination of the colon should be repeated every 5 to 10 years through age 67, unless early forms of pre-cancerous polyps or small growths are found.  Hepatitis C blood testing is recommended for all people born from [birth year] through 1965 and any individual with known risks for hepatitis C.  Healthy men should no longer receive prostate-specific antigen (PSA) blood tests as part of routine cancer screening. Consult with your caregiver about prostate cancer screening.  Testicular cancer screening is not recommended for adolescents or adult males who have no symptoms. Screening includes self-exam, caregiver exam, and other screening tests. Consult with your caregiver about any symptoms you have or any concerns you have about testicular cancer.  Practice safe sex. Use condoms and avoid high-risk sexual practices to reduce the spread of sexually transmitted infections (STIs).  Use sunscreen with a sun protection factor (SPF) of 30 or greater. Apply sunscreen liberally and repeatedly throughout the day. You should seek shade when your shadow is shorter than you. Protect yourself by wearing long sleeves, pants, a wide-brimmed hat, and sunglasses year round, whenever you are outdoors.  Notify your caregiver of new moles or changes in moles, especially if there is a change in shape or color. Also notify your caregiver if a mole is larger than the size of a pencil eraser.  A one-time screening for abdominal aortic aneurysm (AAA) and surgical repair of large AAAs by sound wave imaging (ultrasonography) is recommended for ages 19 to 18 years who are current or former  smokers.  Stay current with your immunizations. Document Released: 09/09/2007 Document Revised: 06/05/2011 Document Reviewed: 08/08/2010 [facility] Patient Information 2014 [place], [place].

## 2013-01-10 ENCOUNTER — Encounter: Payer: Self-pay | Admitting: Family Medicine

## 2013-01-10 DIAGNOSIS — D573 Sickle-cell trait: Secondary | ICD-10-CM

## 2013-01-10 HISTORY — DX: Sickle-cell trait: D57.3

## 2013-01-10 NOTE — Assessment & Plan Note (Signed)
Pt takes medications regularly (norvasc and lisinopril) but forgot to take this morning due to appt. BP moderately elevated today. - Continue taking medications regularly - Continue efforts at weight loss

## 2013-01-10 NOTE — Assessment & Plan Note (Signed)
Following with urology, planning BPH and ultrasound - F/u records - Pt has upcoming appt with them

## 2013-01-10 NOTE — Assessment & Plan Note (Signed)
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

## 2013-01-31 ENCOUNTER — Telehealth: Payer: Self-pay | Admitting: Family Medicine

## 2013-01-31 NOTE — Telephone Encounter (Signed)
Patient has 10 year ASCVD risk of 18.8% and should begin a moderate or high intensity statin if there is no contraindication. He has NKDA.   Please call patient to let him know that based on his labs, I would like him to start a statin. To get started, please ask patient if he has MAP program benefits. This will determine what statin I can start.  Leona Singleton, MD 01/31/2013 11:22 AM

## 2013-01-31 NOTE — Telephone Encounter (Signed)
LM with wife for pt to call back.  Based on labs Dr. Karie Schwalbe would like for him to be on a cholesterol medication.  She also wants to know if he uses the map program at the health department. Jason Rhodes, Jason Rhodes

## 2013-02-27 ENCOUNTER — Ambulatory Visit: Payer: Self-pay | Admitting: Internal Medicine

## 2013-04-04 ENCOUNTER — Ambulatory Visit: Payer: Self-pay | Admitting: Family Medicine

## 2013-04-28 ENCOUNTER — Other Ambulatory Visit: Payer: Self-pay | Admitting: Internal Medicine

## 2013-05-26 ENCOUNTER — Telehealth: Payer: Self-pay | Admitting: Family Medicine

## 2013-05-26 DIAGNOSIS — I10 Essential (primary) hypertension: Secondary | ICD-10-CM

## 2013-05-26 NOTE — Telephone Encounter (Signed)
Pt called and needs refills on both of his BP medications and also Naproxen called in. jw

## 2013-05-27 MED ORDER — LISINOPRIL 40 MG PO TABS: 40.0000 mg | ORAL_TABLET | Freq: Every day | ORAL | Status: DC

## 2013-05-27 MED ORDER — AMLODIPINE BESYLATE 10 MG PO TABS: 10.0000 mg | ORAL_TABLET | Freq: Every day | ORAL | Status: DC

## 2013-05-27 MED ORDER — NAPROXEN 500 MG PO TABS: 500.0000 mg | ORAL_TABLET | Freq: Two times a day (BID) | ORAL | Status: DC | PRN

## 2013-05-27 NOTE — Telephone Encounter (Signed)
Please let patient know I electronically sent refills to [place] and lisinopril for 3 months and naproxen for 1 month.  However I would like him to come in to see [place] in the next 2-4 weeks so we can f/u on his blood pressure and check a BMET to monitor creatinine and potassium while on these antihypertensives.  Thanks. Hilton Sinclair, MD

## 2013-05-27 NOTE — Telephone Encounter (Signed)
appt made for 06/19/13. Jason Rhodes, Jason Rhodes

## 2013-06-19 ENCOUNTER — Ambulatory Visit (INDEPENDENT_AMBULATORY_CARE_PROVIDER_SITE_OTHER): Payer: No Typology Code available for payment source | Admitting: Family Medicine

## 2013-06-19 ENCOUNTER — Encounter: Payer: Self-pay | Admitting: Family Medicine

## 2013-06-19 ENCOUNTER — Telehealth: Payer: Self-pay | Admitting: *Deleted

## 2013-06-19 VITALS — BP 121/81 | HR 87 | Temp 98.7°F | Ht 66.0 in | Wt 203.0 lb

## 2013-06-19 DIAGNOSIS — E785 Hyperlipidemia, unspecified: Secondary | ICD-10-CM | POA: Insufficient documentation

## 2013-06-19 DIAGNOSIS — I10 Essential (primary) hypertension: Secondary | ICD-10-CM

## 2013-06-19 DIAGNOSIS — D649 Anemia, unspecified: Secondary | ICD-10-CM

## 2013-06-19 DIAGNOSIS — M79603 Pain in arm, unspecified: Secondary | ICD-10-CM

## 2013-06-19 DIAGNOSIS — M79609 Pain in unspecified limb: Secondary | ICD-10-CM

## 2013-06-19 DIAGNOSIS — N400 Enlarged prostate without lower urinary tract symptoms: Secondary | ICD-10-CM

## 2013-06-19 LAB — BASIC METABOLIC PANEL
BUN: 14 mg/dL (ref 6–23)
CALCIUM: 9.5 mg/dL (ref 8.4–10.5)
CO2: 25 meq/L (ref 19–32)
Chloride: 102 mEq/L (ref 96–112)
Creat: 1.31 mg/dL (ref 0.50–1.35)
Glucose, Bld: 136 mg/dL — ABNORMAL HIGH (ref 70–99)
Potassium: 4.1 mEq/L (ref 3.5–5.3)
SODIUM: 137 meq/L (ref 135–145)

## 2013-06-19 LAB — CBC
HCT: 37.9 % — ABNORMAL LOW (ref 39.0–52.0)
Hemoglobin: 12.9 g/dL — ABNORMAL LOW (ref 13.0–17.0)
MCH: 29.6 pg (ref 26.0–34.0)
MCHC: 34 g/dL (ref 30.0–36.0)
MCV: 86.9 fL (ref 78.0–100.0)
PLATELETS: 265 10*3/uL (ref 150–400)
RBC: 4.36 MIL/uL (ref 4.22–5.81)
RDW: 13 % (ref 11.5–15.5)
WBC: 4.9 10*3/uL (ref 4.0–10.5)

## 2013-06-19 MED ORDER — ATORVASTATIN CALCIUM 40 MG PO TABS: 40.0000 mg | ORAL_TABLET | Freq: Every day | ORAL | Status: DC

## 2013-06-19 MED ORDER — LISINOPRIL 40 MG PO TABS: 40.0000 mg | ORAL_TABLET | Freq: Every day | ORAL | Status: DC

## 2013-06-19 MED ORDER — NAPROXEN 500 MG PO TABS: 500.0000 mg | ORAL_TABLET | Freq: Two times a day (BID) | ORAL | Status: DC | PRN

## 2013-06-19 MED ORDER — AMLODIPINE BESYLATE 10 MG PO TABS: 10.0000 mg | ORAL_TABLET | Freq: Every day | ORAL | Status: DC

## 2013-06-19 NOTE — Assessment & Plan Note (Signed)
Stable with no symptoms. - Repeat CBC today>>>Hgb 12.9. No tx for now given not symptomatic and mildly low. - Colonoscopy in 2016. Will repeat sooner if anemia worsening. - If becomes symptomatic, check iron studies and consider starting supplement.

## 2013-06-19 NOTE — Assessment & Plan Note (Signed)
Intermittent, controlled with naproxen, uncertain etiology. - Continue naproxen every 2 weeks.

## 2013-06-19 NOTE — Patient Instructions (Signed)
Good to see you!  For your BP, continue medications daily and exercising. For weight, continue exercise and REALLY stick to decreased carbs. You do not have to totally stop them but decreasing is a good idea. For your BPH, we will ask for records from [facility] again. For your pain, your amount of naproxen use is okay. We are checking labs today and I will call you if any are NOT normal.  Best,  Hilton Sinclair, MD   Diet Recommendations for Diabetes   Starchy (carb) foods include: Bread, rice, pasta, potatoes, corn, crackers, bagels, muffins, all baked goods.  (Fruits, milk, and yogurt also have carbohydrate, but most of these foods will not spike your blood sugar as the starchy foods will.)  A few fruits do cause high blood sugars; use small portions of bananas (limit to 1/2 at a time), grapes, and most tropical fruits.    Protein foods include: Meat, fish, poultry, eggs, dairy foods, and beans such as pinto and kidney beans (beans also provide carbohydrate).   1. Eat at least 3 meals and 1-2 snacks per day. Never go more than 4-5 hours while awake without eating.  2. Limit starchy foods to TWO per meal and ONE per snack. ONE portion of a starchy  food is equal to the following:   - ONE slice of bread (or its equivalent, such as half of a hamburger bun).   - 1/2 cup of a "scoopable" starchy food such as potatoes or rice.   - 15 grams of carbohydrate as shown on food label.  3. Both lunch and dinner should include a protein food, a carb food, and vegetables.   - Obtain twice as many veg's as protein or carbohydrate foods for both lunch and dinner.   - Fresh or frozen veg's are best.   - Try to keep frozen veg's on hand for a quick vegetable serving.    4. Breakfast should always include protein.      DASH Diet The DASH diet stands for "Dietary Approaches to Stop Hypertension." It is a healthy eating plan that has been shown to reduce high blood pressure (hypertension) in as  little as 14 days, while also possibly providing other significant health benefits. These other health benefits include reducing the risk of breast cancer after menopause and reducing the risk of type 2 diabetes, heart disease, colon cancer, and stroke. Health benefits also include weight loss and slowing kidney failure in patients with chronic kidney disease.  DIET GUIDELINES  Limit salt (sodium). Your diet should contain less than 1500 mg of sodium daily.  Limit refined or processed carbohydrates. Your diet should include mostly whole grains. Desserts and added sugars should be used sparingly.  Include small amounts of heart-healthy fats. These types of fats include nuts, oils, and tub margarine. Limit saturated and trans fats. These fats have been shown to be harmful in the body. CHOOSING FOODS  The following food groups are based on a 2000 calorie diet. See your Registered Dietitian for individual calorie needs. Grains and Grain Products (6 to 8 servings daily)  Eat More Often: Whole-wheat bread, brown rice, whole-grain or wheat pasta, quinoa, popcorn without added fat or salt (air popped).  Eat Less Often: White bread, white pasta, white rice, cornbread. Vegetables (4 to 5 servings daily)  Eat More Often: Fresh, frozen, and canned vegetables. Vegetables may be raw, steamed, roasted, or grilled with a minimal amount of fat.  Eat Less Often/Avoid: Creamed or fried vegetables. Vegetables  in a cheese sauce. Fruit (4 to 5 servings daily)  Eat More Often: All fresh, canned (in natural juice), or frozen fruits. Dried fruits without added sugar. One hundred percent fruit juice ( cup [237 mL] daily).  Eat Less Often: Dried fruits with added sugar. Canned fruit in light or heavy syrup. [employer], Fish, and Poultry (2 servings or less daily. One serving is 3 to 4 oz [85-114 g]).  Eat More Often: Ninety percent or leaner ground beef, tenderloin, sirloin. Round cuts of beef, chicken breast,  [place] breast. All fish. Grill, bake, or broil your meat. Nothing should be fried.  Eat Less Often/Avoid: Fatty cuts of meat, [place], or chicken leg, thigh, or wing. Fried cuts of meat or fish. Dairy (2 to 3 servings)  Eat More Often: Low-fat or fat-free milk, low-fat plain or light yogurt, reduced-fat or part-skim cheese.  Eat Less Often/Avoid: Milk (whole, 2%).Whole milk yogurt. Full-fat cheeses. Nuts, Seeds, and Legumes (4 to 5 servings per week)  Eat More Often: All without added salt.  Eat Less Often/Avoid: Salted nuts and seeds, canned beans with added salt. Fats and Sweets (limited)  Eat More Often: Vegetable oils, tub margarines without trans fats, sugar-free gelatin. Mayonnaise and salad dressings.  Eat Less Often/Avoid: Coconut oils, palm oils, butter, stick margarine, cream, half and half, cookies, candy, pie. FOR MORE INFORMATION The Dash Diet Eating Plan: www.dashdiet.org Document Released: 03/02/2011 Document Revised: 06/05/2011 Document Reviewed: 03/02/2011 [facility] Patient Information 2014 [place], [place].

## 2013-06-19 NOTE — Telephone Encounter (Signed)
ROI signed by patient and faxed to [facility].  Placed in scan box to be copied into patient's chart.  Tambra Muller, Loralyn Fresh[place], [place]

## 2013-06-19 NOTE — Assessment & Plan Note (Signed)
ASCVD risk estimate elevated at last check. - Lipitor 40mg  daily sent to pharmacy. Pt to call if too expensive. - Consider adding aspirin at next visit.

## 2013-06-19 NOTE — Assessment & Plan Note (Signed)
Controlled on current regimen.  - Continue; refilled today. - Weight loss plan: Continue regular exercising with goal to keep carbs to minimum. - Dash diet and diet to prevent diabetes provided. - BMET today. - F/u 6 mo.

## 2013-06-19 NOTE — Assessment & Plan Note (Signed)
S/p prostatectomy at [facility] signed for records.

## 2013-06-19 NOTE — Progress Notes (Signed)
Patient ID: Ulys Favia, male   DOB: November 13, 1950, 63 y.o.   MRN: [id number] Subjective:   CC: Follow-up HTN  HPI:   HTN: Seen 01/10/13, taking norvasc and lisinopril regularly but had forgotten to take it that morning and BP was moderately elevated. We had planned on continued efforts at weight loss. Took BP meds this morning. No CP, dyspnea, blurred vision, or leg swelling. No dizziness or fainting. [facility] [place], doing treadmill (1 hr) and walking track (30-45 min) daily, weekend misses 1-2 days weekly. 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 Has [employer] and gets meds at [facility].  BPH - Patient recovering from prostate surgery 12/18. Everything going okay, has f/u October 10, 2013 at [facility] urology. Healed well. Urinating more freely with some difficulty, that he was told would last a while. Urine still brownish sometimes, improving. Done with medication now (flomax and finasteride). Still has some urinary dripping. [facility] told him results of enlarged prostate was not cancer. No problems with sex.  Anemia - Been anemic a long time. Denies SOB and chest pain ,does not report fatigue. Did colonoscopy x 2, reportedly 2013 with polyps removed and plan to repeat in 2016.  Arm pain- Patient calls it "arthritis" of fingers and pain in arm. Takes naproxen when painful, only once every 2 weeks or so, last used 2 tabs 2 weeks ago.   Review of Systems - Per HPI.   PMH: - Stopped finasteride 5 and flomax, per urologist recommendations  SH - Smoking status: Former smoker, quit in 1980s, had smoked 7 years    Objective:  Physical Exam BP 121/81  Pulse 87  Temp(Src) 98.7 F (37.1 C) (Oral)  Ht 5\' 6"  (1.676 m)  Wt 203 lb (92.08 kg)  BMI 32.78 kg/m2 GEN: NAD, well-groomed, pleasant CV: RRR no m/r/g, 2+ bilateral radial and PT pulses PULM: CTAB, normal effort ABD: Soft, Obese EXTR: No LE edema or calf  tenderness PSYCH: Mood and affect euthymic, normal rate and volume of speech NEURO: Awake, alert, oriented, normal speech and gait, no focal defificts SKIN: No rash or cyanosis HEENT: AT/[place], sclera clear, EOMI   Assessment:     Derron Pipkins is a 63 y.o. male with h/o HTN and BPH here for f/u of HTN.    Plan:     # See problem list and after visit summary for problem-specific plans. - Pos TB test - CXR 10/14 (No active cardiopulm disease). Re-eval for symptoms 12/2013.  # Health Maintenance: Next due 12/2013.  Follow-up: Follow up in 6 months for f/u of HTN and weight.  Hilton Sinclair, MD [place]

## 2013-06-24 ENCOUNTER — Encounter: Payer: Self-pay | Admitting: Family Medicine

## 2013-09-19 ENCOUNTER — Ambulatory Visit: Payer: No Typology Code available for payment source

## 2013-12-19 ENCOUNTER — Telehealth: Payer: Self-pay | Admitting: Family Medicine

## 2013-12-19 DIAGNOSIS — E785 Hyperlipidemia, unspecified: Secondary | ICD-10-CM

## 2013-12-19 DIAGNOSIS — I10 Essential (primary) hypertension: Secondary | ICD-10-CM

## 2013-12-19 MED ORDER — ATORVASTATIN CALCIUM 40 MG PO TABS: 40.0000 mg | ORAL_TABLET | Freq: Every day | ORAL | Status: DC

## 2013-12-19 MED ORDER — LISINOPRIL 40 MG PO TABS: 40.0000 mg | ORAL_TABLET | Freq: Every day | ORAL | Status: DC

## 2013-12-19 MED ORDER — AMLODIPINE BESYLATE 10 MG PO TABS: 10.0000 mg | ORAL_TABLET | Freq: Every day | ORAL | Status: DC

## 2013-12-19 NOTE — Telephone Encounter (Signed)
Pt informed. Jason Rhodes  

## 2013-12-19 NOTE — Telephone Encounter (Signed)
Pt called and needs a refill on his Lisinopril and Amlodipine. He also needs a refill on his Lipitor if she wants him to continue to take this it. Blima Rich

## 2013-12-19 NOTE — Telephone Encounter (Addendum)
Sent in refills of lisinopril, amlodipine, and lipitor. He should continue the lipitor for cholesterol control as long as he is not having unwanted side effects. Please let him know he is due for f/u of his HTN.  Thanks.  Hilton Sinclair, MD

## 2013-12-22 ENCOUNTER — Telehealth: Payer: Self-pay | Admitting: Family Medicine

## 2013-12-22 NOTE — Telephone Encounter (Signed)
Pt was seen by cardiologist who suggested he needed a referral to the sleep center for sleep apena Please advise

## 2013-12-22 NOTE — Telephone Encounter (Signed)
Please call and help him set up an appointment to see me where I can evaluate and place the referral if appropriate and we can talk about treatment options. Thanks!  Hilton Sinclair, MD

## 2013-12-23 NOTE — Telephone Encounter (Signed)
LMOVM for pt to schedule an appt. Jason Rhodes, Jason Rhodes

## 2014-02-10 ENCOUNTER — Ambulatory Visit: Payer: Self-pay

## 2014-02-13 ENCOUNTER — Ambulatory Visit (INDEPENDENT_AMBULATORY_CARE_PROVIDER_SITE_OTHER): Payer: Self-pay | Admitting: *Deleted

## 2014-02-13 ENCOUNTER — Encounter: Payer: Self-pay | Admitting: Family Medicine

## 2014-02-13 ENCOUNTER — Ambulatory Visit (HOSPITAL_COMMUNITY)
Admission: RE | Admit: 2014-02-13 | Discharge: 2014-02-13 | Disposition: A | Discharge status: Home / Self Care | Payer: No Typology Code available for payment source | Source: Ambulatory Visit | Attending: Family Medicine | Admitting: Family Medicine

## 2014-02-13 ENCOUNTER — Ambulatory Visit (INDEPENDENT_AMBULATORY_CARE_PROVIDER_SITE_OTHER): Payer: Self-pay | Admitting: Family Medicine

## 2014-02-13 ENCOUNTER — Other Ambulatory Visit: Payer: Self-pay | Admitting: Family Medicine

## 2014-02-13 VITALS — BP 136/84 | HR 89 | Temp 98.0°F | Ht 66.0 in | Wt 212.0 lb

## 2014-02-13 DIAGNOSIS — M25562 Pain in left knee: Secondary | ICD-10-CM

## 2014-02-13 DIAGNOSIS — E785 Hyperlipidemia, unspecified: Secondary | ICD-10-CM

## 2014-02-13 DIAGNOSIS — Z23 Encounter for immunization: Secondary | ICD-10-CM

## 2014-02-13 DIAGNOSIS — M7989 Other specified soft tissue disorders: Secondary | ICD-10-CM | POA: Insufficient documentation

## 2014-02-13 DIAGNOSIS — R0683 Snoring: Secondary | ICD-10-CM

## 2014-02-13 DIAGNOSIS — G4733 Obstructive sleep apnea (adult) (pediatric): Secondary | ICD-10-CM | POA: Insufficient documentation

## 2014-02-13 DIAGNOSIS — I10 Essential (primary) hypertension: Secondary | ICD-10-CM

## 2014-02-13 DIAGNOSIS — E669 Obesity, unspecified: Secondary | ICD-10-CM

## 2014-02-13 DIAGNOSIS — N179 Acute kidney failure, unspecified: Secondary | ICD-10-CM

## 2014-02-13 HISTORY — DX: Obstructive sleep apnea (adult) (pediatric): G47.33

## 2014-02-13 IMAGING — CR DG KNEE COMPLETE 4+V*L*
1 series · 1 of 1 positions shown · non-contrast
Comparison: None.

CLINICAL DATA: Left knee pain and swelling for 3 months. No known
injury.

EXAM:
LEFT KNEE - COMPLETE 4+ VIEW

[knee sunrise]
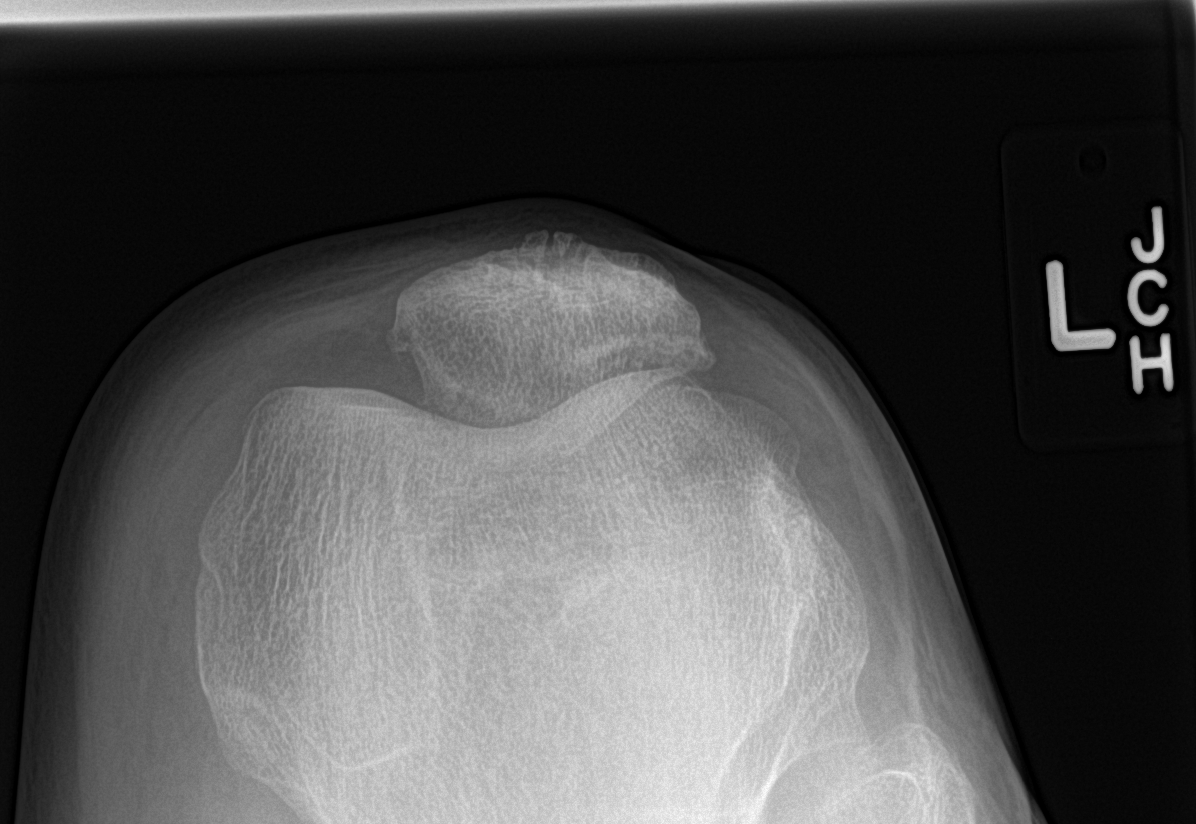

[1 of 1 positions shown; findings below may reference images not displayed]

FINDINGS: No fracture. Femoral tibial joint space compartments are normally
spaced and aligned. There is mild narrowing of the lateral
compartment of the patellofemoral joint space compartment with small
marginal osteophytes from the patella.

Minimal joint effusion.  Soft tissues are unremarkable.
IMPRESSION: 1. No fracture or acute finding.
2. Mild patellofemoral joint arthropathic change. Minimal joint
effusion.

## 2014-02-13 IMAGING — CR DG KNEE COMPLETE 4+V*L*
4 series · 4 of 4 positions shown · non-contrast
Comparison: None.

CLINICAL DATA: Left knee pain and swelling for 3 months. No known
injury.

EXAM:
LEFT KNEE - COMPLETE 4+ VIEW

[knee ap]
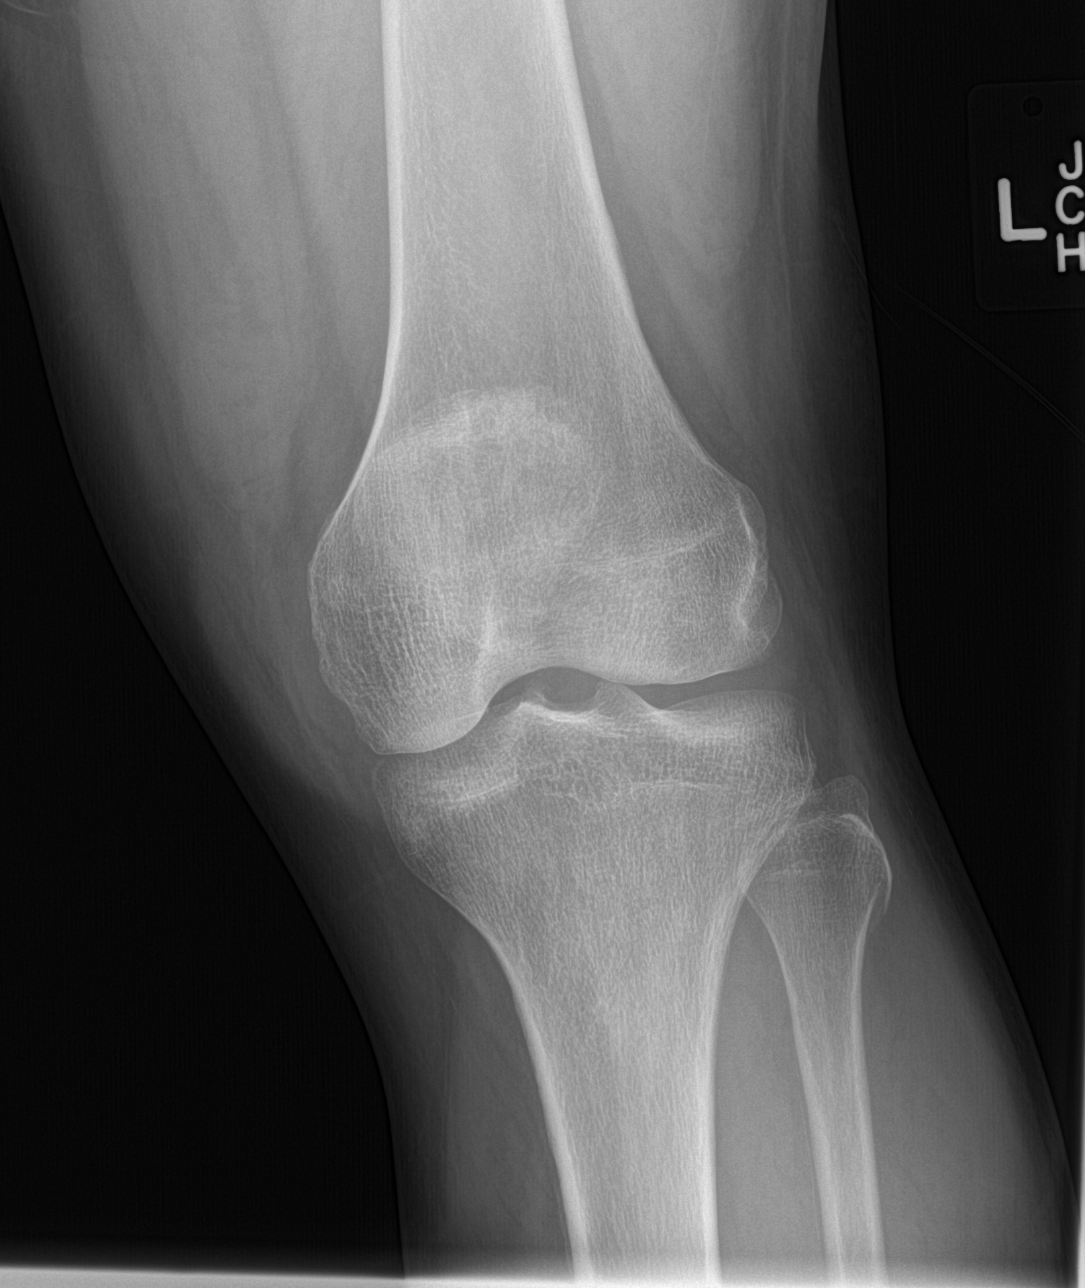

[knee obl (1 of 2)]
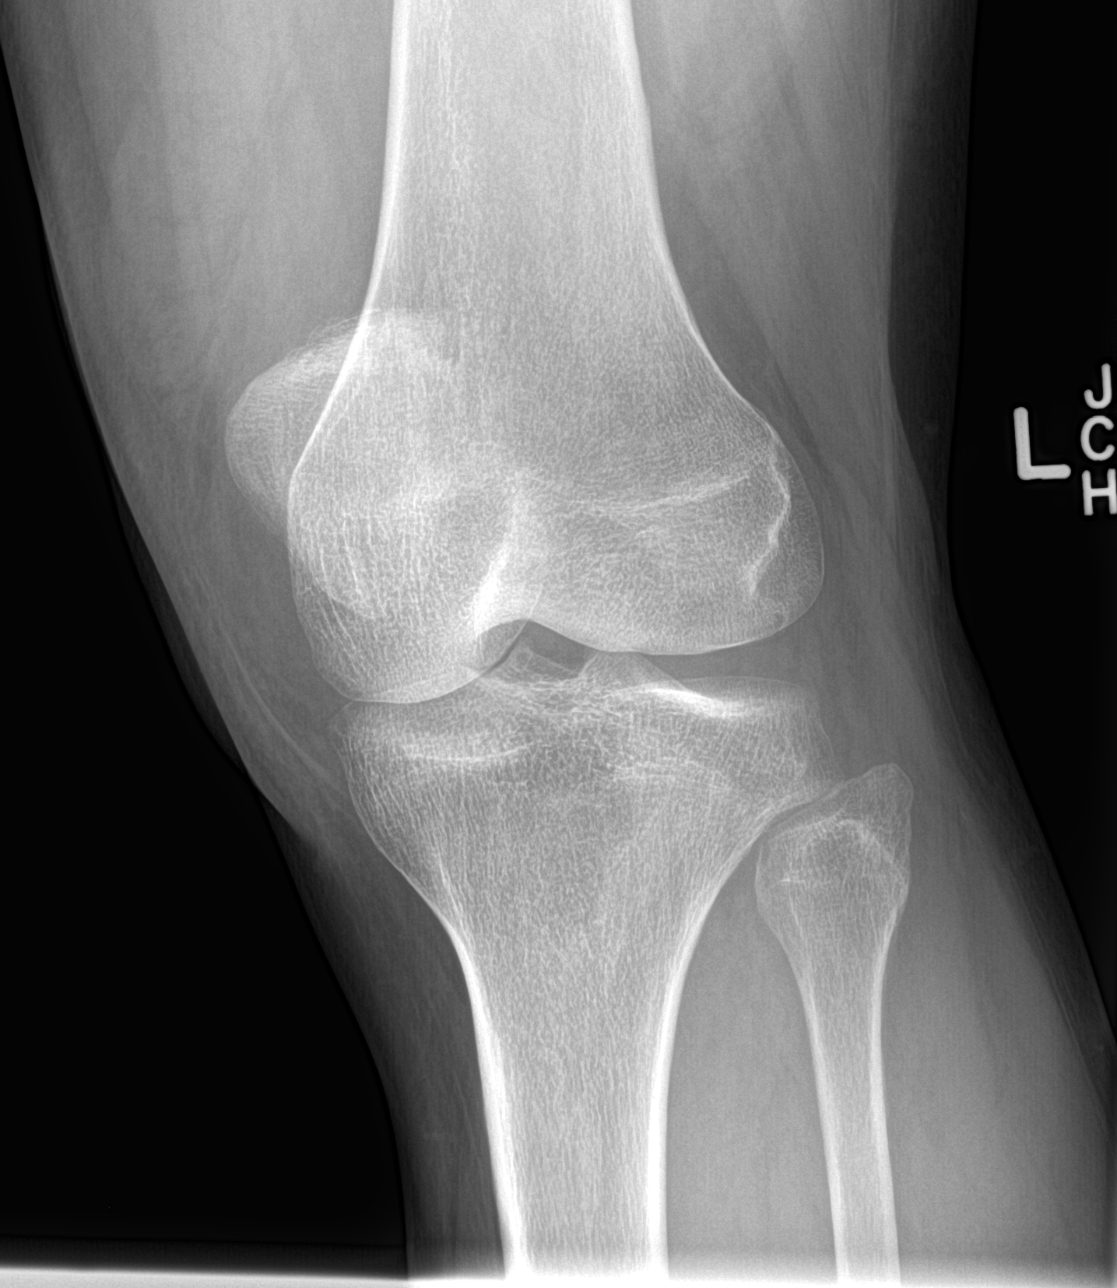

[knee obl (2 of 2)]
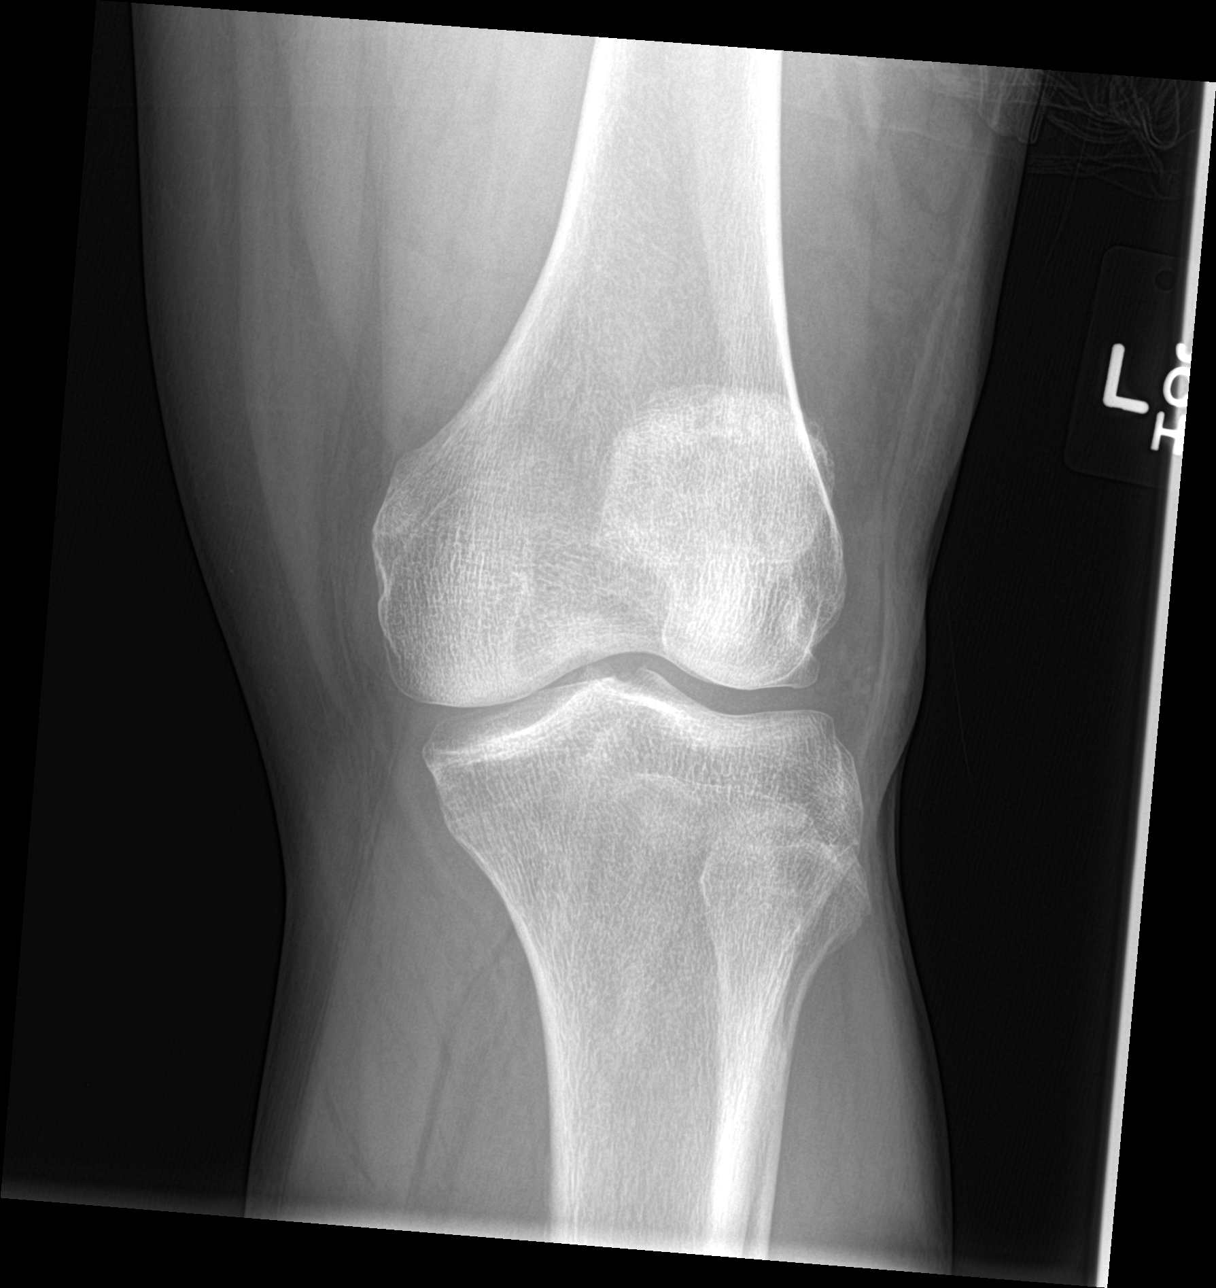

[knee lat]
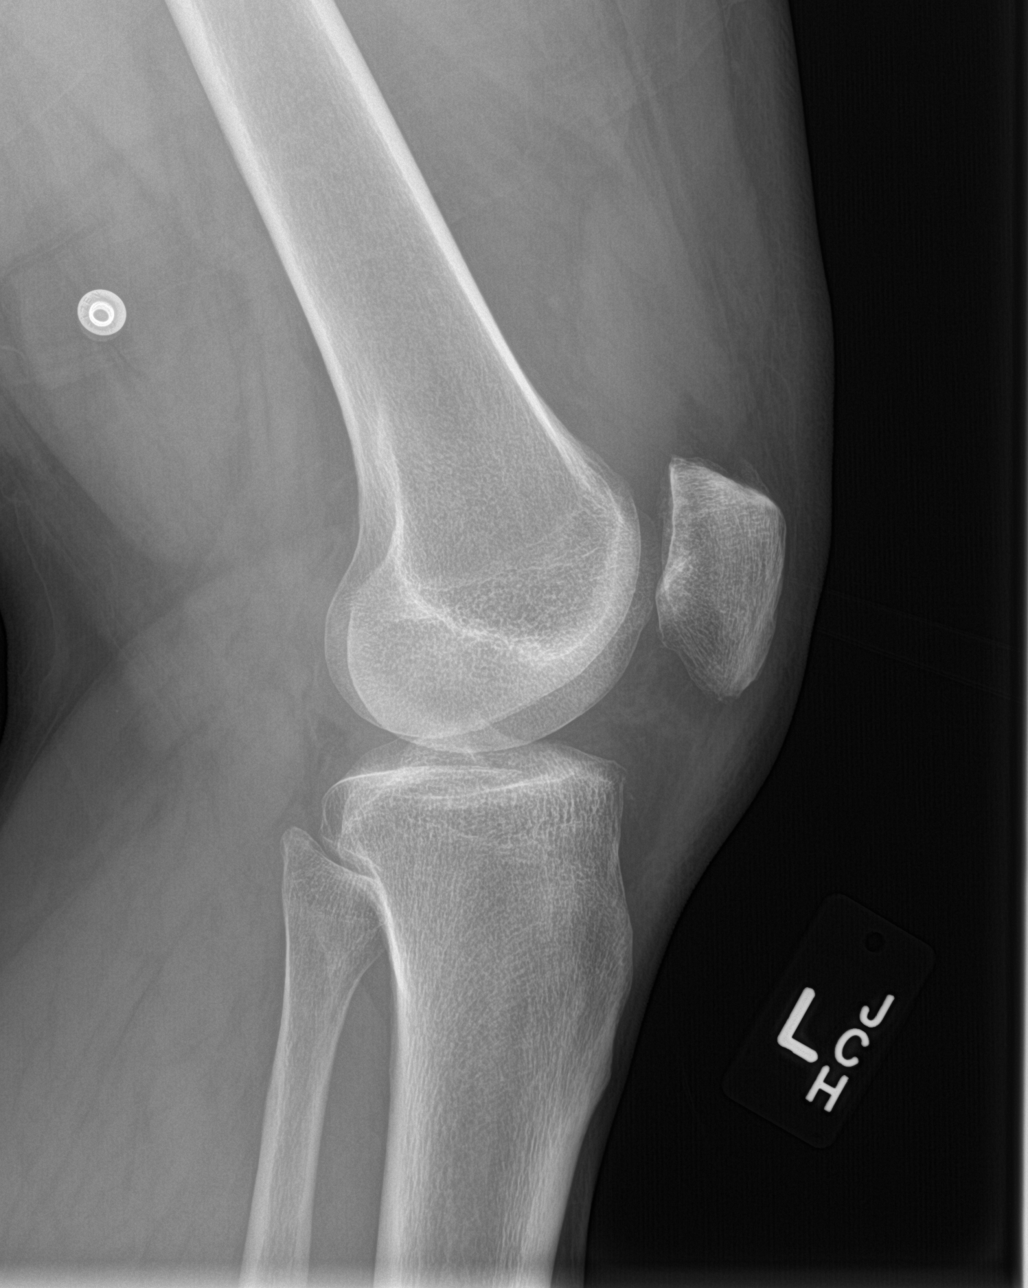

[4 of 4 positions shown; findings below may reference images not displayed]

FINDINGS: No fracture. Femoral tibial joint space compartments are normally
spaced and aligned. There is mild narrowing of the lateral
compartment of the patellofemoral joint space compartment with small
marginal osteophytes from the patella.

Minimal joint effusion.  Soft tissues are unremarkable.
IMPRESSION: 1. No fracture or acute finding.
2. Mild patellofemoral joint arthropathic change. Minimal joint
effusion.

## 2014-02-13 NOTE — Assessment & Plan Note (Signed)
Taking atorvastatin 40mg  daily. - Lipid panel today. - Pt would like to come off medication if possible.

## 2014-02-13 NOTE — Progress Notes (Signed)
Patient ID: Jason Rhodes, male   DOB: 03-27-1951, 63 y.o.   MRN: [id number] Subjective:   CC: Sleep issue, knee pain  HPI:   Sleep issue Jason Rhodes is a 62 y.o. male whose wife was recently referred for sleep study. While there, he was answering positive to all of the sleep-related questions. The physician told him to ask for referral for sleep study. Snoring at night, occasionally feels he cannot breathe. Wife notes he stops breathing a few seconds and starts back up snoring. Gets up choking in sleep. Feels sleepy during day which family corroborates. Gets up in the middle of the night and sometimes falls alseep in 15 minutes, sometimes takes 2-3 hours to fall back asleep. Nodding off during the day. Goes to bed 10 or 11 pm.  Takes a few minutes to fall asleep. Wakes self up with snoring and choking. No coughing other times in the day. Weight: 212 lbs, has been slowly gaining but no rapid weight gain or LE swelling.  Sleep hygiene: TV on in the room at bedtime. Dark otherwise.  Likes it hot in the room per family. Drinks coffee all day long and green tea.   Left knee pain Present for 3-4 months.  Stepped on rock and twisted ankle 4 months ago. Ankle hurt "a while" and was swollen but eventually got better with ice.  2-3 days after that, left calf started to hurt.  3 months ago left knee started hurting and swelling. Pain is 3/10.  Periodically swells left lateral knee but no redness. Generalized anterior knee pain especially when going up steps. No redness, fevers, or chills. Buckles randomly, though more if standing up. When get up from seated a while, hurts worse. In morning, swelling in knee has gone down. Walking makes pain worse and going up/down steps. Naproxen helps.  Has not needed in about 1 month as pain has gotten a little better over a month.  Hyperlipidemia Patient would like cholesterol checked. Lipitor 40mg  daily started after last check. No  complaints.  AKI Mild, noted at last visit. Sent letter to patient recommending increased PO intake. Today, daughter states he does not nearly get enough fluid intake. He drinks lots of caffeinated beverages.   Review of Systems - Per HPI.  PMH: Sickle cell trait Positive TB test HTN HLD Mild anemia Epididymitis BPH Arm pain Mediactions reviewed.  Smoking status: Never smoker    Objective:  Physical Exam BP 136/84 mmHg  Pulse 89  Temp(Src) 98 F (36.7 C) (Oral)  Ht 5\' 6"  (1.676 m)  Wt 212 lb (96.163 kg)  BMI 34.23 kg/m2 GEN: NAD HEENT: O/p clear but unable to see uvula when opens wide without asking pt to say "ah" PULM: CTAB CV: RRR EXTR: No LE edema Left knee mild lateral swelling, no erythema Medial joint line tenderness No instability Normal gait Generalized anterior knee tenderness Calves soft No left hip tenderness ABD: Obese     Assessment:     Jason Rhodes is a 63 y.o. male here for sleep issue, left knee pain, f/u HLD, and f/u AKI.    Plan:     # See problem list and after visit summary for problem-specific plans.   # Health Maintenance: flu shot  Follow-up: Follow up in 1 month for knee pain.   Hilton Sinclair, MD [place]

## 2014-02-13 NOTE — Assessment & Plan Note (Addendum)
Generalized, with some tenderness at medial joint line. Mild swelling. No fevers or erythema. No prior imaging. Likely osteoarthritis given intermittent swelling and obese patient; unlikely to be infection or gout; no joint instability or crepitus making ligamentous or meniscal tear unlikely. - Xray left knee complete with sunrise view - PT ordered. - Knee sleeve suggested for compression of inflammation. - NSAIDs prn - Weight loss recommended - Ice after workout

## 2014-02-13 NOTE — Assessment & Plan Note (Signed)
Mild, noted at last lab check. Discussed increasing hydration.  - Recheck BMET today. - Again urged 6-8 glasses of water daily.

## 2014-02-13 NOTE — Assessment & Plan Note (Signed)
Sleepiness throughout day, snoring at night, reported awakenings at night. Some issues with sleep hygiene as well, and possibly circadian rhythm disturbance with multiple naps in daytime. - Epworth sleepiness scale>>>17 (very sleepy and should seek medical advice) - Referral placed for polysomnography at [facility]. Will order autoadjusting CPAP if needed. - Sleep hygiene discussed.

## 2014-02-13 NOTE — Patient Instructions (Signed)
Good to see you today!  I referred you for a sleep study. Please also work on sleep hygiene, including avoiding caffeine after 3pm and turning the TV off when getting ready to sleep. Reading before bed can be a good way to get ready for sleep.  We are checking labs today and I will call if any are NOT normal.  For your knee, we are checking an xray and I will call if it is NOT normal. You can ice your knee after activity to help with swelling. Use ibuprofen 2-400mg  4 times daily as needed. Buy a knee sleeve that is comfortable and will help compress the knee. Work on weight loss as this will help with joint pain. If you do not hear about PT in 2 weeks, call us.  Best,  Hilton Sinclair, MD

## 2014-02-14 LAB — LIPID PANEL
CHOLESTEROL: 115 mg/dL (ref 0–200)
HDL: 38 mg/dL — AB (ref >39)
LDL Cholesterol: 66 mg/dL (ref 0–99)
Total CHOL/HDL Ratio: 3 Ratio
Triglycerides: 53 mg/dL (ref <150)
VLDL: 11 mg/dL (ref 0–40)

## 2014-02-14 LAB — BASIC METABOLIC PANEL
BUN: 11 mg/dL (ref 6–23)
CALCIUM: 9 mg/dL (ref 8.4–10.5)
CO2: 27 mEq/L (ref 19–32)
Chloride: 103 mEq/L (ref 96–112)
Creat: 1.08 mg/dL (ref 0.50–1.35)
Glucose, Bld: 105 mg/dL — ABNORMAL HIGH (ref 70–99)
POTASSIUM: 4 meq/L (ref 3.5–5.3)
SODIUM: 141 meq/L (ref 135–145)

## 2014-03-02 ENCOUNTER — Ambulatory Visit: Payer: No Typology Code available for payment source | Attending: Family Medicine | Admitting: Physical Therapy

## 2014-03-02 ENCOUNTER — Telehealth: Payer: Self-pay | Admitting: *Deleted

## 2014-03-02 ENCOUNTER — Encounter: Payer: Self-pay | Admitting: Family Medicine

## 2014-03-02 DIAGNOSIS — G8929 Other chronic pain: Secondary | ICD-10-CM

## 2014-03-02 DIAGNOSIS — M25562 Pain in left knee: Secondary | ICD-10-CM | POA: Insufficient documentation

## 2014-03-02 NOTE — Therapy (Signed)
[facility] [address] [place], [place], [address] Phone: [phone]   Fax:  [phone]  Physical Therapy Evaluation  Patient Details  Name: Jason Rhodes MRN: [id number] Date of Birth: [birth year]  Encounter Date: 03/02/2014      PT End of Session - 03/02/14 0856    Visit Number 1   Number of Visits 12   Date for PT Re-Evaluation 04/13/14   PT Start Time 0809   PT Stop Time 0850   PT Time Calculation (min) 41 min   Activity Tolerance Patient tolerated treatment well      No past medical history on file.  Past Surgical History  Procedure Laterality Date  . Hernia repair    . Shoulder surgery      There were no vitals taken for this visit.  Visit Diagnosis:  Knee pain, chronic, left      Subjective Assessment - 03/02/14 0812    Symptoms Pt. twisted knee and ankle 3 mos ago. Continues with pain and swelling, weakness and occ stiffness.     Pertinent History HTN, reremote history of Lt. knee injury when he "was a teen", was casted and hospitalized.    Limitations Walking  Pain with exercise, walking   How long can you sit comfortably? NA   How long can you stand comfortably? 15-30 min    How long can you walk comfortably? On treadmill, 15-20 min. Outside, , 15 min.  USed to do 1-2 hours.    Diagnostic tests XR   Patient Stated Goals Resume normal exercise, walk on track   Currently in Pain? No/denies   Pain Score 9   with walking on track   Pain Location Knee   Pain Orientation Left   Pain Type Chronic pain   Pain Onset More than a month ago   Pain Frequency Intermittent   Pain Relieving Factors Naproxen, rest, brace   Multiple Pain Sites No          [facility] PT Assessment - 03/02/14 0819    Assessment   Medical Diagnosis knee pain with mild arthropathy   Onset Date 11/14/13   Balance Screen   Has the patient fallen in the past 6 months No   Has the patient had a decrease in activity level because of a fear of  falling?  No   Is the patient reluctant to leave their home because of a fear of falling?  No   Home Environment   Home Access Stairs to enter   Entrance Stairs-Rails Can reach both   Observation/Other Assessments   Focus on Therapeutic Outcomes (FOTO)  58% limit   Sensation   Light Touch Appears Intact   AROM   Left Knee Extension 0  0   Left Knee Flexion 120   Strength   Right Hip Flexion --  4+/5   Right Hip Extension 4/5   Right Hip ABduction 4/5   Left Hip Flexion --  4+/5   Left Hip Extension 4/5   Left Hip ABduction 4/5   Right Knee Flexion 5/5   Right Knee Extension 5/5   Left Knee Flexion 5/5   Left Knee Extension 4/5  with pain, midrange.  If locked out 5/5   Flexibility   Hamstrings --  50 deg   Quadriceps tight   ITB tight   Thomas Test    Findings Positive   Side Right;Left   Ober's Test   Findings Negative   Side Left   McConnell Test  Findings Not tested   Patellofemoral Grind test (Clark's Sign)   Findings Postive   Side  Left    Positive McMurrays test for lateral meniscus tear. Will re-assess.  Pain with palpation to lateral joint line and throughout hip MMT.  Able to do SLR but very painful. Could not hold mid range knee flexion for MMT.       [facility] Adult PT Treatment/Exercise - 03/02/14 1402    Knee/Hip Exercises: Stretches   Active Hamstring Stretch 2 reps;30 seconds   Quad Stretch Limitations advised   ITB Stretch 2 reps;30 seconds   Cryotherapy   Number Minutes Cryotherapy 10 Minutes   Cryotherapy Location Knee   Type of Cryotherapy Ice pack          PT Education - 03/02/14 0856    Education provided Yes   Education Details PT/POC, HEP, ICE   Person(s) Educated Patient;Spouse   Methods Explanation;Demonstration;Verbal cues;Handout   Comprehension Verbalized understanding;Returned demonstration          PT Short Term Goals - 03/02/14 0901    PT SHORT TERM GOAL #1   Title Pt. will be I with initial HEP   Time 2    Period Weeks   Status New   PT SHORT TERM GOAL #2   Title Pt. will report using ice for pain relief consistently   Time 2   Period Weeks   Status New   PT SHORT TERM GOAL #3   Title Pt. will be able to walk in community with pain <5/10 most of the time.    Time 2   Period Weeks   Status New          PT Long Term Goals - 03/02/14 0902    PT LONG TERM GOAL #1   Title Pt. will be I with advanced HEP   Time 6   Period Weeks   Status New   PT LONG TERM GOAL #2   Title Pt. will be able to walk on track 30 min with min pain increase from baseline.    Time 6   Period Weeks   Status New   PT LONG TERM GOAL #3   Title Pt. will be able to perform heel raises and squats without knee pain   Time 6   Period Weeks   Status New   PT LONG TERM GOAL #4   Title Pt. will demo 5/5 strength throughout hip for overall improved gait stability and stairs.    Time 6   Period Weeks   Status New          Plan - 03/02/14 0857    Clinical Impression Statement Patient presents with mild to mod knee pain and findings consistent with meniscal tear.   He will benefit from skilled PT to improve pain and stability with gait, return to full exercise program.    Pt will benefit from skilled therapeutic intervention in order to improve on the following deficits Decreased range of motion;Difficulty walking;Impaired flexibility;Increased edema;Pain;Decreased balance;Decreased strength;Decreased mobility   Rehab Potential Excellent   PT Frequency 2x / week   PT Duration 6 weeks   PT Treatment/Interventions Therapeutic activities;Patient/family education;Passive range of motion;Therapeutic exercise;Ultrasound;Gait training;Balance training;Manual techniques;Cryotherapy;Stair training;Neuromuscular re-education;Functional mobility training;Electrical Stimulation   PT Next Visit Plan check HEP and add SLR, hip strength   PT Home Exercise Plan ITB, hamstring and quad stretch   Consulted and Agree with Plan of  Care Patient  Problem List Patient Active Problem List   Diagnosis Date Noted  . Left knee pain 02/13/2014  . Snoring 02/13/2014  . AKI (acute kidney injury) 02/13/2014  . Hyperlipidemia 06/19/2013  . Arm pain 06/19/2013  . Sickle cell trait 01/10/2013  . 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 Mild anemia 12/22/2012  . Health care maintenance 12/09/2012  . Epididymitis 11/28/2012  . BPH (benign prostatic hyperplasia) 08/27/2012  . Unspecified essential hypertension 08/27/2012  Raeford Razor, PT 03/02/2014 2:09 PM Phone: [phone] Fax: [phone]   Tanaja Ganger 03/02/2014, 2:06 PM

## 2014-03-02 NOTE — Patient Instructions (Signed)
KNEE: Quadriceps - Prone   Place strap around ankle. Bring ankle toward buttocks. Press hip into surface. Hold _30__ seconds. _2-3  reps per set, __2_ sets per day, ___5-7 days per week   Copyright  VHI. All rights reserved.       Leg Extension (Hamstring)   Sit toward front edge of chair, with leg out straight, heel on floor, toes pointing toward body. Keeping back straight, bend forward at hip, breathing out through pursed lips. Return, breathing in. Repeat _2-3__ times. Repeat with other leg. Do 1-2___ sessions per day. Variation: Perform from standing position, with support.   Hamstring Stretch   With other leg bent, foot flat, grasp right leg and slowly try to straighten knee. Hold ____ seconds. Repeat ____ times. Do ____ sessions per day.  http://[email or web address].exer.us/279      Hamstring Stretch, Reclined (Strap, Doorframe)   Lengthen bottom leg on floor. Extend top leg along edge of doorframe or press foot up into yoga strap. Hold for ____ breaths. Repeat ____ times each leg.  THEN CROSS LEG OVER TO R side TO FEEL STRETCH OUTSIDE OF L KNEE, HOLD 30 sec

## 2014-03-02 NOTE — Telephone Encounter (Signed)
APPTS MADE AND PRINTED...TD 

## 2014-03-09 ENCOUNTER — Ambulatory Visit: Payer: No Typology Code available for payment source | Admitting: Physical Therapy

## 2014-03-09 DIAGNOSIS — M25562 Pain in left knee: Principal | ICD-10-CM

## 2014-03-09 DIAGNOSIS — G8929 Other chronic pain: Secondary | ICD-10-CM

## 2014-03-09 NOTE — Therapy (Signed)
Outpatient Rehabilitation [facility] [address] [place], [place], [address] Phone: [phone]   Fax:  [phone]  Physical Therapy Treatment  Patient Details  Name: Jason Rhodes MRN: [id number] Date of Birth: January 29, 1951  Encounter Date: 03/09/2014      PT End of Session - 03/09/14 1011    Visit Number 2   Number of Visits 12   Date for PT Re-Evaluation 04/13/14   PT Start Time 0935   PT Stop Time 7253   PT Time Calculation (min) 39 min   Activity Tolerance Patient tolerated treatment well      No past medical history on file.  Past Surgical History  Procedure Laterality Date  . Hernia repair    . Shoulder surgery      There were no vitals taken for this visit.  Visit Diagnosis:  Knee pain, chronic, left      Subjective Assessment - 03/09/14 0943    Symptoms gets cramping after sitting awhile.   Currently in Pain? No/denies   Pain Score 0-No pain   Pain Orientation Left   Pain Descriptors / Indicators --  cramping, bones get painful   Pain Type Chronic pain   Pain Onset More than a month ago   Aggravating Factors  walking, getting up from sitting   Pain Relieving Factors Naproxen heat, ice intermittantly, rest   Effect of Pain on Daily Activities increases   Multiple Pain Sites No            [facility] Adult PT Treatment/Exercise - 03/09/14 0946    Knee/Hip Exercises: Stretches   Active Hamstring Stretch 2 reps;30 seconds   Active Hamstring Stretch Limitations --  72 degrees, cued for not locking knee   ITB Stretch 2 reps;30 seconds  Sidelying   Knee/Hip Exercises: Supine   Quad Sets 2 sets   Short Arc Quad Sets Limitations --  5 Lbs., 20 reps   Straight Leg Raises AROM;20 reps   Knee/Hip Exercises: Sidelying   Hip ABduction Left;2 sets   Knee/Hip Exercises: Prone   Hip Extension 2 sets   Moist Heat Therapy   Number Minutes Moist Heat 15 Minutes   Moist Heat Location --  knee            PT Short Term Goals - 03/09/14 0959     PT SHORT TERM GOAL #1   Title Pt. will be I with initial HEP   Time 2   Period Weeks   Status On-going   PT SHORT TERM GOAL #2   Title Pt. will report using ice for pain relief consistently   Time 2   Period Weeks   Status On-going   PT SHORT TERM GOAL #3   Title Pt. will be able to walk in community with pain <5/10 most of the time.    Time 2   Period Weeks   Status On-going            Plan - 03/09/14 1012    Clinical Impression Statement Focus today on basic hip/Knee exercises.  Hamstrings ROM improving.  Adherent with  home exercises.   PT Next Visit Plan check HEP and add SLR, hip strength   PT Home Exercise Plan ITB, hamstring and quad stretch   Consulted and Agree with Plan of Care Patient                               Problem List Patient Active Problem  List   Diagnosis Date Noted  . Left knee pain 02/13/2014  . Snoring 02/13/2014  . AKI (acute kidney injury) 02/13/2014  . Hyperlipidemia 06/19/2013  . Arm pain 06/19/2013  . Sickle cell trait 01/10/2013  . 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 Mild anemia 12/22/2012  . Health care maintenance 12/09/2012  . Epididymitis 11/28/2012  . BPH (benign prostatic hyperplasia) 08/27/2012  . Unspecified essential hypertension 08/27/2012   Melvenia Needles, PTA 03/09/2014 10:15 AM Phone: [phone] Fax: [phone]  HARRIS,KAREN 03/09/2014, 10:15 AM

## 2014-03-12 ENCOUNTER — Ambulatory Visit: Payer: No Typology Code available for payment source | Admitting: Physical Therapy

## 2014-03-12 DIAGNOSIS — G8929 Other chronic pain: Secondary | ICD-10-CM

## 2014-03-12 DIAGNOSIS — M25562 Pain in left knee: Principal | ICD-10-CM

## 2014-03-12 NOTE — Therapy (Signed)
Outpatient Rehabilitation [facility] [address] [place], [place], [address] Phone: [phone]   Fax:  [phone]  Physical Therapy Treatment  Patient Details  Name: Jason Rhodes MRN: [id number] Date of Birth: April 01, 1950  Encounter Date: 03/12/2014      PT End of Session - 03/12/14 1100    Visit Number 3   Number of Visits 12   Date for PT Re-Evaluation 04/13/16   PT Start Time 1016   PT Stop Time 1103   PT Time Calculation (min) 47 min   Activity Tolerance Patient tolerated treatment well;Patient limited by pain      No past medical history on file.  Past Surgical History  Procedure Laterality Date  . Hernia repair    . Shoulder surgery      There were no vitals taken for this visit.  Visit Diagnosis:  Knee pain, chronic, left      Subjective Assessment - 03/12/14 1019    Symptoms 5/10.  Less pain with getting up.  Steps less painful and easier.            [facility] Adult PT Treatment/Exercise - 03/12/14 1020    Knee/Hip Exercises: Aerobic   Stationary Bike --  New  step 7 minutes, 511 steps, workload 5   Knee/Hip Exercises: Machines for Strengthening   Total Gym Leg Press 1 plate, 20 reps with guarding and cues.   Knee/Hip Exercises: Supine   Quad Sets 2 sets   Short Arc Quad Sets 2 sets  progressed to 7 LBS 5 second holds, shakey   Manual Therapy   Manual Therapy --  soft tissue work, Lt  ITB taping to knee and ITB          PT Education - 03/12/14 1046    Education provided Yes   Person(s) Educated Patient   Methods Explanation;Demonstration;Tactile cues;Handout   Comprehension Verbalized understanding;Returned demonstration              Plan - 03/12/14 1100    Clinical Impression Statement check taping, review ITB stretch next. Function improving   PT Next Visit Plan check ITB and progress home exercises.                               Problem List Patient Active Problem List   Diagnosis Date  Noted  . Left knee pain 02/13/2014  . Snoring 02/13/2014  . AKI (acute kidney injury) 02/13/2014  . Hyperlipidemia 06/19/2013  . Arm pain 06/19/2013  . Sickle cell trait 01/10/2013  . 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 Mild anemia 12/22/2012  . Health care maintenance 12/09/2012  . Epididymitis 11/28/2012  . BPH (benign prostatic hyperplasia) 08/27/2012  . Unspecified essential hypertension 08/27/2012   Jason Rhodes, PTA 03/12/2014 11:03 AM Phone: [phone] Fax: [phone]   Jason Rhodes 03/12/2014, 11:03 AM

## 2014-03-12 NOTE — Patient Instructions (Signed)
   Iliotibial Band Stretch, Side-Lying   Lie on side, back to edge of bed, top arm in front. Allow top leg to drape behind over edge. Hold _30__ seconds.  Repeat __3 times per session. Do __1_ sessions per day.

## 2014-03-16 ENCOUNTER — Ambulatory Visit: Payer: No Typology Code available for payment source | Admitting: Physical Therapy

## 2014-03-16 DIAGNOSIS — M25562 Pain in left knee: Principal | ICD-10-CM

## 2014-03-16 DIAGNOSIS — G8929 Other chronic pain: Secondary | ICD-10-CM

## 2014-03-16 NOTE — Patient Instructions (Signed)
Hip Flexion / Knee Extension: Straight-Leg Raise (Eccentric)   Lie on back. Lift leg with knee straight. Slowly lower leg for 3-5 seconds. _10__ reps per set, __2-3_ sets per day, _5_ days per week. Lower like elevator, stopping at each floor. Rest on elbows. Rest on straight arms.  Copyright  VHI. All rights reserved.  Outer Hip Stretch: Reclined IT Band Stretch (Strap)   Strap around opposite foot, pull across only as far as possible with shoulders on mat. Hold for __5_ breaths. Repeat __2-3__ times each leg.  Copyright  VHI. All rights reserved.  Abduction: Clam (Eccentric) - Side-Lying   Lie on side with knees bent. Lift top knee, keeping feet together. Keep trunk steady. Slowly lower for 3-5 seconds. __20_ reps per set, _1-2__ sets per day, ___5 days per week. Copyright  VHI. All rights reserved.

## 2014-03-16 NOTE — Therapy (Signed)
[facility] [place], [place], [address] Phone: [phone]   Fax:  [phone]  Physical Therapy Treatment  Patient Details  Name: Jason Rhodes MRN: [id number] Date of Birth: Feb 11, 1951  Encounter Date: 03/16/2014      PT End of Session - 03/16/14 1112    Visit Number 4   Number of Visits 12   Date for PT Re-Evaluation 04/13/14   PT Start Time 0930   PT Stop Time 1013   PT Time Calculation (min) 43 min      No past medical history on file.  Past Surgical History  Procedure Laterality Date  . Hernia repair    . Shoulder surgery      There were no vitals taken for this visit.  Visit Diagnosis:  Knee pain, chronic, left      Subjective Assessment - 03/16/14 0935    Symptoms No pain today, had some pain this weekend. Lilked the tape.    Limitations Walking  Steps   How long can you sit comfortably? NA   How long can you stand comfortably? as long as needed.    How long can you walk comfortably? as long as needed   Patient Stated Goals Resume normal exercise, walk on track, as of today, [facility]   Currently in Pain? No/denies   Aggravating Factors  stairs   Pain Relieving Factors ice, rest, tape   Multiple Pain Sites No          [facility] PT Assessment - 03/16/14 0945    Strength   Right Hip ABduction 4/5   Left Hip ABduction 4/5   Left Knee Extension --  4+/5 mid range                  [facility] Adult PT Treatment/Exercise - 03/16/14 0942    Knee/Hip Exercises: Stretches   Active Hamstring Stretch 2 reps;30 seconds   ITB Stretch 2 reps;30 seconds   Knee/Hip Exercises: Aerobic   Stationary Bike NuStep level 7 legs only 5 min   Knee/Hip Exercises: Supine   Straight Leg Raises Left;2 sets   Straight Leg Raise with External Rotation Left;2 sets   Knee/Hip Exercises: Sidelying   Clams --  x20 bilat.    Manual Therapy   Manual Therapy Myofascial release  < 5 min   Myofascial Release to L ITB     Kinesiotape to Lt. ITB (1 "I" along distal 1/2 of ITB, perpendicular "I" at tender area.   Also 2 Y taping to Lt. Knee to activate quads           PT Education - 03/16/14 1112    Education provided Yes   Education Details HEP   Person(s) Educated Patient   Methods Explanation;Demonstration;Handout   Comprehension Verbalized understanding;Returned demonstration          PT Short Term Goals - 03/16/14 0938    PT SHORT TERM GOAL #1   Title Pt. will be I with initial HEP   Status Achieved   PT SHORT TERM GOAL #2   Title Pt. will report using ice for pain relief consistently   Status Achieved   PT SHORT TERM GOAL #3   Title Pt. will be able to walk in community with pain <5/10 most of the time.    Status Achieved           PT Long Term Goals - 03/16/14 8938    PT LONG TERM  GOAL #1   Title Pt. will be I with advanced HEP   Status On-going   PT LONG TERM GOAL #2   Title Pt. will be able to walk on track 30 min with min pain increase from baseline.    Status On-going               Plan - 03/16/14 1114    Clinical Impression Statement Patient is improving with pain and swelling, has not returned to any degree of his normal exercise program.  Not doing HEP as prescribed.  He will cont to benefit from PT for full function at gym and stairs.    Rehab Potential Excellent   PT Frequency 2x / week   PT Duration 6 weeks   PT Next Visit Plan check HEP- ITB, clam and SLR with/without ER   PT Home Exercise Plan see above   Consulted and Agree with Plan of Care Patient        Problem List Patient Active Problem List   Diagnosis Date Noted  . Left knee pain 02/13/2014  . Snoring 02/13/2014  . AKI (acute kidney injury) 02/13/2014  . Hyperlipidemia 06/19/2013  . Arm pain 06/19/2013  . Sickle cell trait 01/10/2013  . 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 Mild anemia 12/22/2012  . Health care maintenance 12/09/2012  . Epididymitis  11/28/2012  . BPH (benign prostatic hyperplasia) 08/27/2012  . Unspecified essential hypertension 08/27/2012    PAA,JENNIFER 03/16/2014, 11:20 AM  [facility] [address] [place], [place], [address] Phone: [phone]   Fax:  [phone]  Raeford Razor, PT 03/16/2014 11:27 AM Phone: [phone] Fax: [phone]

## 2014-03-18 ENCOUNTER — Ambulatory Visit: Payer: No Typology Code available for payment source | Admitting: Physical Therapy

## 2014-03-18 ENCOUNTER — Telehealth: Payer: Self-pay | Admitting: *Deleted

## 2014-03-18 DIAGNOSIS — M25562 Pain in left knee: Principal | ICD-10-CM

## 2014-03-18 DIAGNOSIS — G8929 Other chronic pain: Secondary | ICD-10-CM

## 2014-03-18 NOTE — Therapy (Addendum)
[place], [place], [address] Phone: [phone]   Fax:  [phone]  Physical Therapy Treatment/Discharge  Patient Details  Name: Jason Rhodes MRN: [id number] Date of Birth: 02-28-1951  Encounter Date: 03/18/2014      PT End of Session - 03/18/14 1005    Visit Number 5   Number of Visits 12   Date for PT Re-Evaluation 04/13/14   PT Start Time 0930   PT Stop Time 1018   PT Time Calculation (min) 48 min      No past medical history on file.  Past Surgical History  Procedure Laterality Date  . Hernia repair    . Shoulder surgery      There were no vitals taken for this visit.  Visit Diagnosis:  Knee pain, chronic, left      Subjective Assessment - 03/18/14 0940    Symptoms No c/o today.   Currently in Pain? No/denies   Pain Score 3   with [facility] Adult PT Treatment/Exercise - 03/18/14 0942    Knee/Hip Exercises: Stretches   Active Hamstring Stretch 2 reps;30 seconds   ITB Stretch 2 reps;30 seconds   Piriformis Stretch 2 reps;30 seconds   Knee/Hip Exercises: Aerobic   Stationary Bike NuStep L 7 LE only 7 min   Knee/Hip Exercises: Machines for Strengthening   Cybex Leg Press 1 plate x20, heel raises with stretch 1 plate, 2 plate x20   Knee/Hip Exercises: Standing   Forward Lunges 1 set;10 reps   Side Lunges Both;1 set;10 reps   Step Down Left;1 set;15 reps   Step Down Limitations used 6 inch step 1 UE   Other Standing Knee Exercises bend over hip ext x 10 each    Knee/Hip Exercises: Supine   Straight Leg Raises Left;2 sets  x20   Straight Leg Raise with External Rotation Left;2 sets;20 reps   Knee/Hip Exercises: Sidelying   Clams x20 Bilat 1x 10 with red band                PT Education - 03/18/14 1008    Education provided No          PT Short Term Goals - 03/16/14 0938    PT SHORT TERM GOAL #1   Title Pt. will be I with initial  HEP   Status Achieved   PT SHORT TERM GOAL #2   Title Pt. will report using ice for pain relief consistently   Status Achieved   PT SHORT TERM GOAL #3   Title Pt. will be able to walk in community with pain <5/10 most of the time.    Status Achieved           PT Long Term Goals - 03/16/14 1916    PT LONG TERM GOAL #1   Title Pt. will be I with advanced HEP   Status On-going   PT LONG TERM GOAL #2   Title Pt. will be able to walk on track 30 min with min pain increase from baseline.    Status On-going               Plan - 03/18/14 1008    Clinical Impression Statement Recommended patient do some walking over the next few days to see how knee does.  Progressing towards all goals.  Demonstrated quad weakness eccentrically with steps today.  PT Next Visit Plan progress to more closed chain ex, cont on TM, elliptical   PT Home Exercise Plan cont, add walking.    Consulted and Agree with Plan of Care Patient        Problem List Patient Active Problem List   Diagnosis Date Noted  . Left knee pain 02/13/2014  . Snoring 02/13/2014  . AKI (acute kidney injury) 02/13/2014  . Hyperlipidemia 06/19/2013  . Arm pain 06/19/2013  . Sickle cell trait 01/10/2013  . 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 Mild anemia 12/22/2012  . Health care maintenance 12/09/2012  . Epididymitis 11/28/2012  . BPH (benign prostatic hyperplasia) 08/27/2012  . Unspecified essential hypertension 08/27/2012    Zyion Doxtater 03/18/2014, 10:21 AM  [facility] [address] [place], [place], [address] Phone: [phone]   Fax:  [phone]  Raeford Razor, PT 03/18/2014 10:22 AM Phone: [phone] Fax: [phone]   PHYSICAL THERAPY DISCHARGE SUMMARY  Visits from Start of Care: 5  Current functional level related to goals / functional outcomes: See above for last visit details.  Current unknown  Remaining deficits: Unknown   Education  / Equipment: HEP, walking, knee pain, strength/ROM, posture Plan: Patient agrees to discharge.  Patient goals were partially met. Patient is being discharged due to not returning since the last visit.  ?????   Raeford Razor, PT 02/02/2015 9:25 AM Phone: [phone] Fax: [phone]

## 2014-03-18 NOTE — Telephone Encounter (Signed)
Pt stated that they will call back to schedule their appts...td

## 2014-04-28 ENCOUNTER — Ambulatory Visit (HOSPITAL_BASED_OUTPATIENT_CLINIC_OR_DEPARTMENT_OTHER): Payer: No Typology Code available for payment source | Attending: Family Medicine

## 2014-04-28 DIAGNOSIS — R0683 Snoring: Secondary | ICD-10-CM | POA: Insufficient documentation

## 2014-04-29 ENCOUNTER — Other Ambulatory Visit: Payer: Self-pay | Admitting: Family Medicine

## 2014-05-01 NOTE — Telephone Encounter (Signed)
Discussed with patient in the past, he is only using about once every 2 weeks. Will refill but would expect would not need to refill for quite some time. If requests again, will need to rediscuss frequency of use./  Hilton Sinclair, MD

## 2014-05-09 DIAGNOSIS — R0683 Snoring: Secondary | ICD-10-CM

## 2014-05-09 NOTE — Sleep Study (Signed)
   NAME: Jason Rhodes DATE OF BIRTH:  18-Jun-1950 MEDICAL RECORD NUMBER [id number]  LOCATION: [facility]  PHYSICIAN: Ninnie Fein D  DATE OF STUDY: 04/28/2014  SLEEP STUDY TYPE: Nocturnal Polysomnogram               REFERRING PHYSICIAN: Conni Slipper T, *  INDICATION FOR STUDY: Hypersomnia with sleep apnea  EPWORTH SLEEPINESS SCORE:   24/24 HEIGHT: 5\' 6"  (167.6 cm)  WEIGHT: 215 lb (97.523 kg)    Body mass index is 34.72 kg/(m^2).  NECK SIZE: 16 in.  MEDICATIONS: Charted for review  SLEEP ARCHITECTURE: Total sleep time 318.5 minutes with sleep efficiency 87.5%. Stage I was 31.6%, stage II 52.7%, stage III absent, REM 15.7% of total sleep time. Sleep latency 2.5 minutes, REM latency 62.5 minutes, awake after sleep onset 43 minutes, arousal index 67.4, bedtime medication: None   RESPIRATORY DATA: Apnea hypopnea index (AHI) 77.4 per hour. 411 total events scored including 310 obstructive apneas, 3 central apneas, 38 mixed apneas, 60 hypopneas. Non-positional events. REM AHI 92.4. This study was ordered as a diagnostic polysomnogram without CPAP.  OXYGEN DATA: Very loud snoring with oxygen desaturation to a nadir of 65% and mean saturation 87.1% on room air  CARDIAC DATA: Sinus rhythm with PACs and PVCs  MOVEMENT/PARASOMNIA: No significant movement disturbance, no bathroom trips  IMPRESSION/ RECOMMENDATION:   1) Severe obstructive sleep apnea/hypopnea syndrome, AHI 77.4 per hour with non-positional events. REM AHI 92.4 per hour. Very loud snoring with oxygen desaturation to a nadir of 65% and mean oxygen saturation 87.1% on room air. 2) This study was ordered as a diagnostic polysomnogram without CPAP. The patient can return for dedicated CPAP titration study if appropriate.   Deneise Lever Diplomate, American Board of Sleep Medicine  ELECTRONICALLY SIGNED ON:  05/09/2014, 11:44 AM [place] PH: [phone]   FX: [phone]  [phone] [place]

## 2014-05-17 ENCOUNTER — Telehealth: Payer: Self-pay | Admitting: Family Medicine

## 2014-05-17 DIAGNOSIS — G4733 Obstructive sleep apnea (adult) (pediatric): Secondary | ICD-10-CM

## 2014-05-17 NOTE — Telephone Encounter (Signed)
Patient's sleep study showed 1) Severe obstructive sleep apnea/hypopnea syndrome, AHI 77.4 per hour with non-positional events. REM AHI 92.4 per hour. Very loud snoring with oxygen desaturation to a nadir of 65% and mean oxygen saturation 87.1% on room air.  Pt needs dedicated CPAP titration study.   Will refer to [facility] with whom he had sleep study.  Hilton Sinclair, MD

## 2014-05-18 NOTE — Telephone Encounter (Signed)
Pt is aware of results.  He voiced understanding on getting a call from the sleep center. Jazmin Hartsell,CMA

## 2014-06-01 ENCOUNTER — Ambulatory Visit (HOSPITAL_BASED_OUTPATIENT_CLINIC_OR_DEPARTMENT_OTHER): Payer: No Typology Code available for payment source

## 2014-06-09 ENCOUNTER — Ambulatory Visit (HOSPITAL_BASED_OUTPATIENT_CLINIC_OR_DEPARTMENT_OTHER): Payer: No Typology Code available for payment source | Attending: Family Medicine

## 2014-06-09 VITALS — Ht 66.0 in | Wt 220.0 lb

## 2014-06-09 DIAGNOSIS — G4733 Obstructive sleep apnea (adult) (pediatric): Secondary | ICD-10-CM

## 2014-06-13 DIAGNOSIS — G4730 Sleep apnea, unspecified: Secondary | ICD-10-CM

## 2014-06-13 NOTE — Sleep Study (Signed)
   NAME: Jason Rhodes DATE OF BIRTH:  11/12/1950 MEDICAL RECORD NUMBER [id number]  LOCATION: [facility]  PHYSICIAN: Ranessa Kosta D  DATE OF STUDY: 06/09/2014  SLEEP STUDY TYPE: Nocturnal Polysomnogram               REFERRING PHYSICIAN: Conni Slipper T, *  INDICATION FOR STUDY: Hypersomnia with sleep apnea-CPAP titration  EPWORTH SLEEPINESS SCORE:   1/24 HEIGHT: 5\' 6"  (167.6 cm)  WEIGHT: 220 lb (99.791 kg)    Body mass index is 35.53 kg/(m^2).  NECK SIZE:   in.  MEDICATIONS: Charted for review  SLEEP ARCHITECTURE: Total sleep time 312 minutes with sleep efficiency 84.1%. Stage I was 10.9%, stage II 67.1%, stage III absent, REM 22% of total sleep time. Sleep latency 6 minutes, REM latency 47.5 minutes, awake after sleep onset 54 minutes, arousal index 4.2  RESPIRATORY DATA: CPAP titration protocol. CPAP was titrated to 16 CWP, AHI 0 per hour. He wore a small fullface mask.  OXYGEN DATA: Snoring was prevented by CPAP with mean oxygen saturation 91.5% on room air  CARDIAC DATA: Sinus rhythm with PACs  MOVEMENT/PARASOMNIA: No significant movement disturbance, no bathroom trips  IMPRESSION/ RECOMMENDATION:   1) Successful CPAP titration to 16 CWP, AHI 0 per hour. He wore a small F[employer]P Simplus fullface mask with heated humidifier. Snoring was prevented and mean oxygen saturation was 91.5% on room air. 2) Baseline polysomnogram on 04/28/14 recorded AHI 77.4/ hr with body weight 215 pounds.  Deneise Lever Diplomate, American Board of Sleep Medicine  ELECTRONICALLY SIGNED ON:  06/13/2014, 10:21 AM [place] PH: [phone]   FX: [phone] [place]

## 2014-06-29 ENCOUNTER — Telehealth: Payer: Self-pay | Admitting: Family Medicine

## 2014-06-29 DIAGNOSIS — E785 Hyperlipidemia, unspecified: Secondary | ICD-10-CM

## 2014-06-29 DIAGNOSIS — I10 Essential (primary) hypertension: Secondary | ICD-10-CM

## 2014-06-29 MED ORDER — ATORVASTATIN CALCIUM 40 MG PO TABS: 40.0000 mg | ORAL_TABLET | Freq: Every day | ORAL | Status: DC

## 2014-06-29 MED ORDER — LISINOPRIL 40 MG PO TABS: 40.0000 mg | ORAL_TABLET | Freq: Every day | ORAL | Status: DC

## 2014-06-29 MED ORDER — AMLODIPINE BESYLATE 10 MG PO TABS: 10.0000 mg | ORAL_TABLET | Freq: Every day | ORAL | Status: DC

## 2014-06-29 NOTE — Telephone Encounter (Signed)
Refil request. Will forward to MD for review. Jason Rhodes, CMA.

## 2014-06-29 NOTE — Telephone Encounter (Signed)
Requesting refill on lisinopril, amlodipine, and atorvastatin, [facility]

## 2014-06-29 NOTE — Telephone Encounter (Signed)
Sent in.  Thanks [employer]. Awanda Mink, DO of [facility] 06/29/2014, 11:24 AM

## 2014-08-31 ENCOUNTER — Ambulatory Visit: Payer: No Typology Code available for payment source | Admitting: Family Medicine

## 2014-09-03 ENCOUNTER — Ambulatory Visit: Payer: No Typology Code available for payment source | Admitting: Family Medicine

## 2014-09-10 ENCOUNTER — Ambulatory Visit: Payer: No Typology Code available for payment source

## 2014-12-07 ENCOUNTER — Encounter: Payer: Self-pay | Admitting: Family Medicine

## 2014-12-07 ENCOUNTER — Ambulatory Visit (INDEPENDENT_AMBULATORY_CARE_PROVIDER_SITE_OTHER): Payer: Medicare Other | Admitting: Family Medicine

## 2014-12-07 ENCOUNTER — Ambulatory Visit (HOSPITAL_COMMUNITY)
Admission: RE | Admit: 2014-12-07 | Discharge: 2014-12-07 | Disposition: A | Discharge status: Home / Self Care | Payer: Medicare Other | Source: Ambulatory Visit | Attending: Family Medicine | Admitting: Family Medicine

## 2014-12-07 ENCOUNTER — Ambulatory Visit (HOSPITAL_COMMUNITY)
Admission: RE | Admit: 2014-12-07 | Discharge: 2014-12-07 | Disposition: A | Discharge status: Home / Self Care | Payer: Medicare Other | Source: Other Acute Inpatient Hospital | Attending: Emergency Medicine | Admitting: Emergency Medicine

## 2014-12-07 VITALS — BP 152/93 | HR 74 | Temp 98.3°F | Ht 66.0 in | Wt 216.0 lb

## 2014-12-07 DIAGNOSIS — Z23 Encounter for immunization: Secondary | ICD-10-CM

## 2014-12-07 DIAGNOSIS — M47892 Other spondylosis, cervical region: Secondary | ICD-10-CM | POA: Diagnosis not present

## 2014-12-07 DIAGNOSIS — R079 Chest pain, unspecified: Secondary | ICD-10-CM

## 2014-12-07 DIAGNOSIS — R202 Paresthesia of skin: Secondary | ICD-10-CM | POA: Diagnosis not present

## 2014-12-07 DIAGNOSIS — R200 Anesthesia of skin: Secondary | ICD-10-CM | POA: Diagnosis not present

## 2014-12-07 DIAGNOSIS — M542 Cervicalgia: Secondary | ICD-10-CM | POA: Diagnosis not present

## 2014-12-07 DIAGNOSIS — M79602 Pain in left arm: Secondary | ICD-10-CM | POA: Insufficient documentation

## 2014-12-07 DIAGNOSIS — M79601 Pain in right arm: Secondary | ICD-10-CM | POA: Diagnosis not present

## 2014-12-07 IMAGING — CR DG CHEST 1V
1 series · 1 of 1 positions shown · non-contrast
Comparison: January 08, 2013

CLINICAL DATA: Three weeks of left arm pain, heaviness and
tingling.

EXAM:
CHEST  1 VIEW

[chest pa]
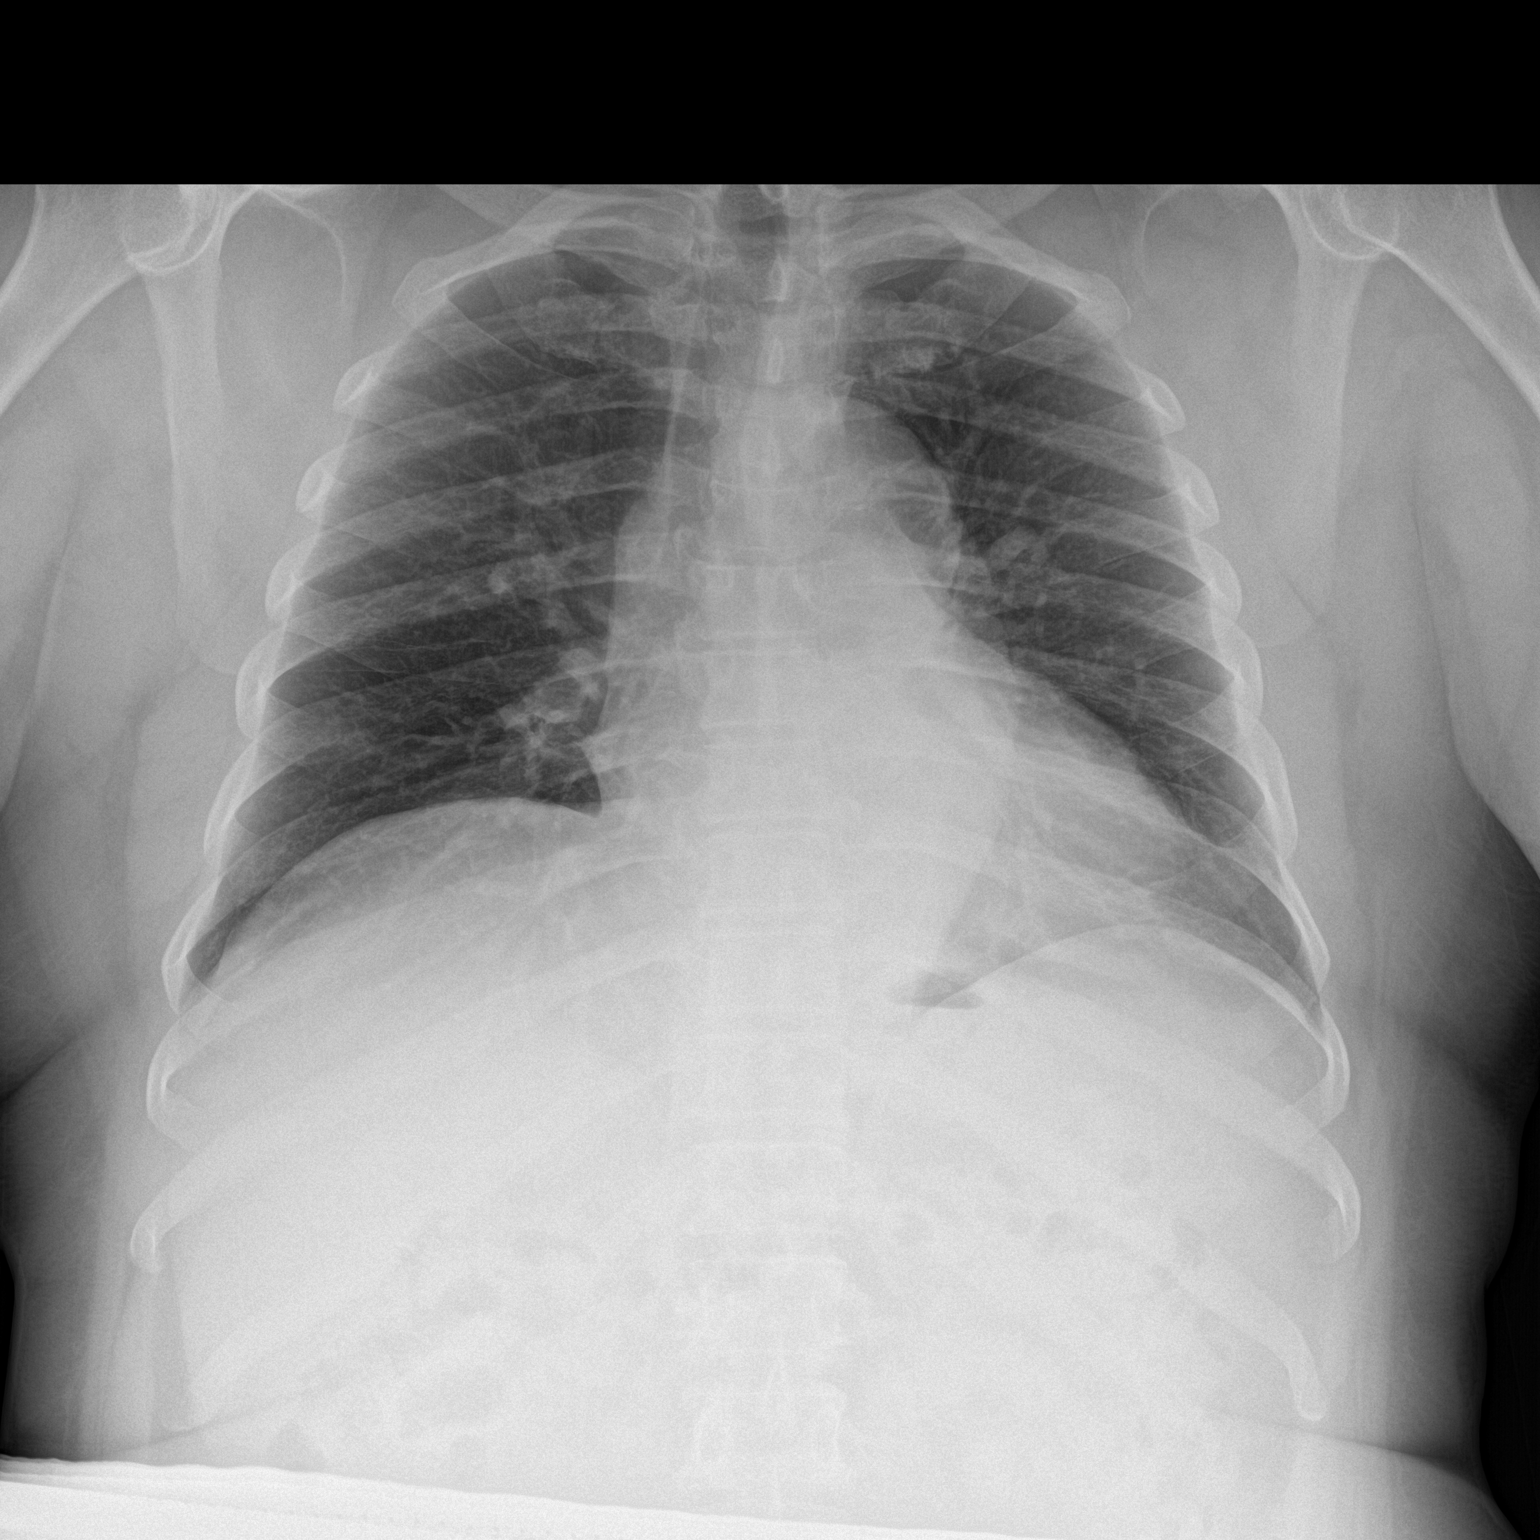

[1 of 1 positions shown; findings below may reference images not displayed]

FINDINGS: The heart size and mediastinal contours are stable. The heart size
is enlarged. The aorta is tortuous. Both lungs are clear. There are
minimal degenerative joint changes of bilateral acromioclavicular
joints. The visualized skeletal structures are otherwise
unremarkable.
IMPRESSION: No active cardiopulmonary disease.

## 2014-12-07 IMAGING — CR DG CERVICAL SPINE COMPLETE 4+V
5 series · 5 of 5 positions shown · non-contrast
Comparison: None.

CLINICAL DATA: Cervicalgia with left upper extremity radicular
symptoms

EXAM:
CERVICAL SPINE  4+ VIEWS

[c-spine lat]
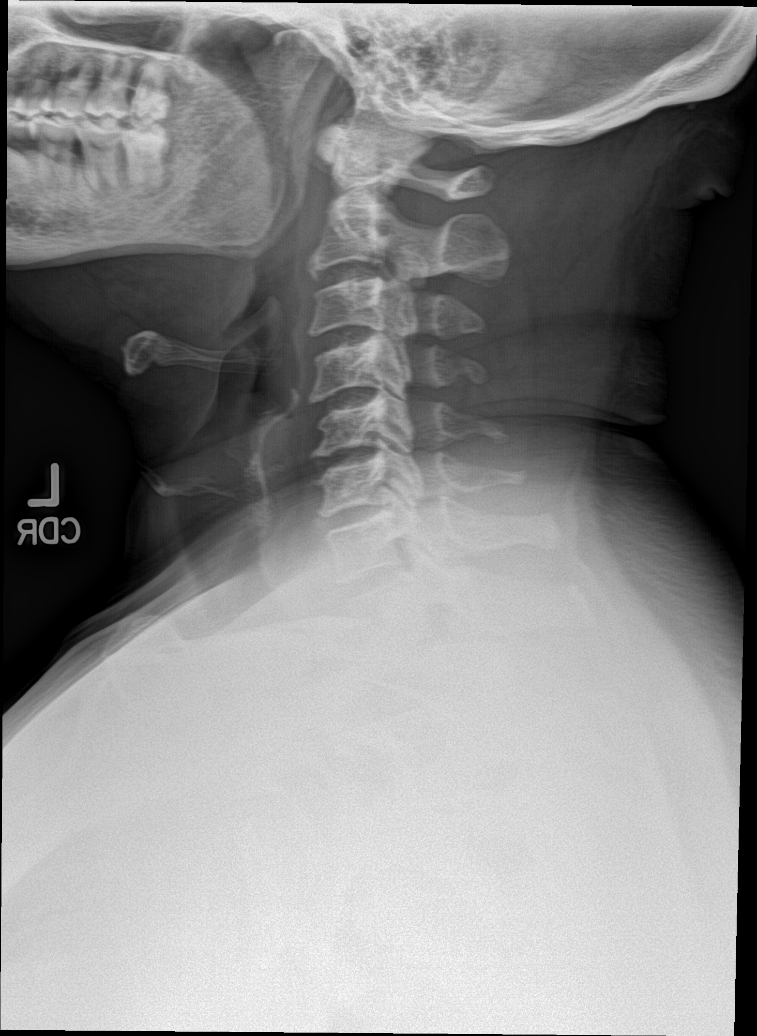

[c-spine obl (1 of 2)]
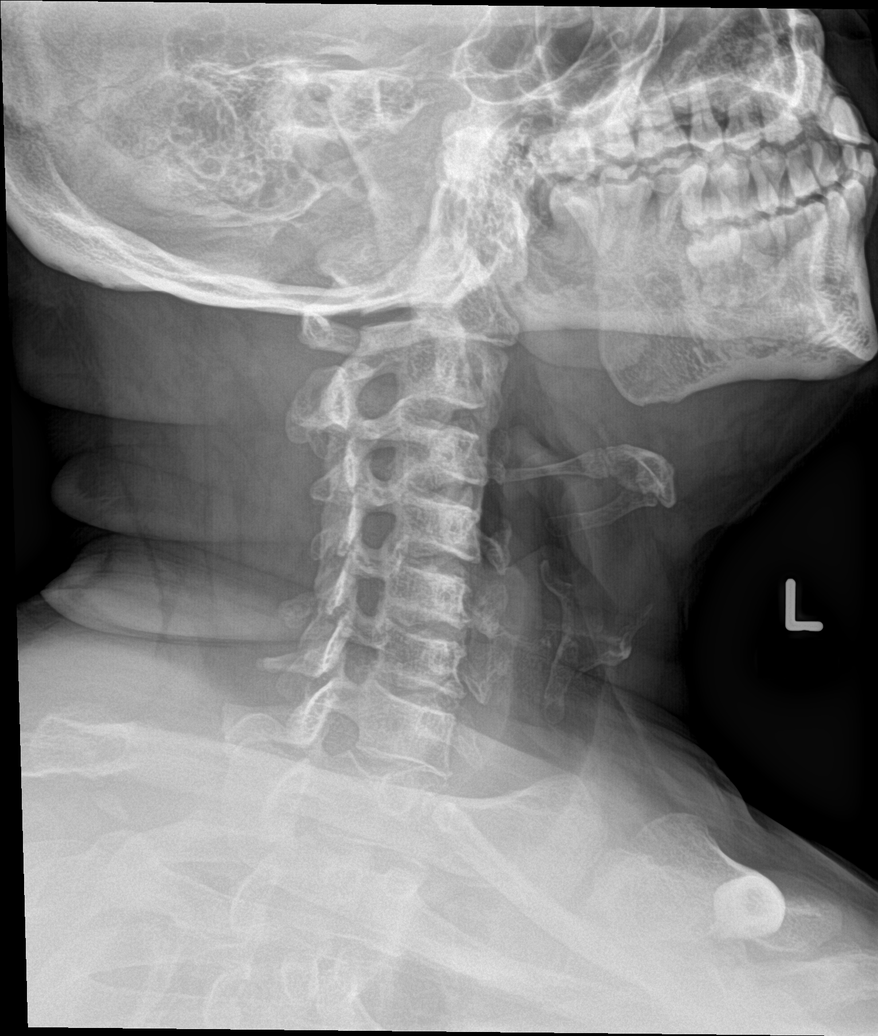

[c-spine obl (2 of 2)]
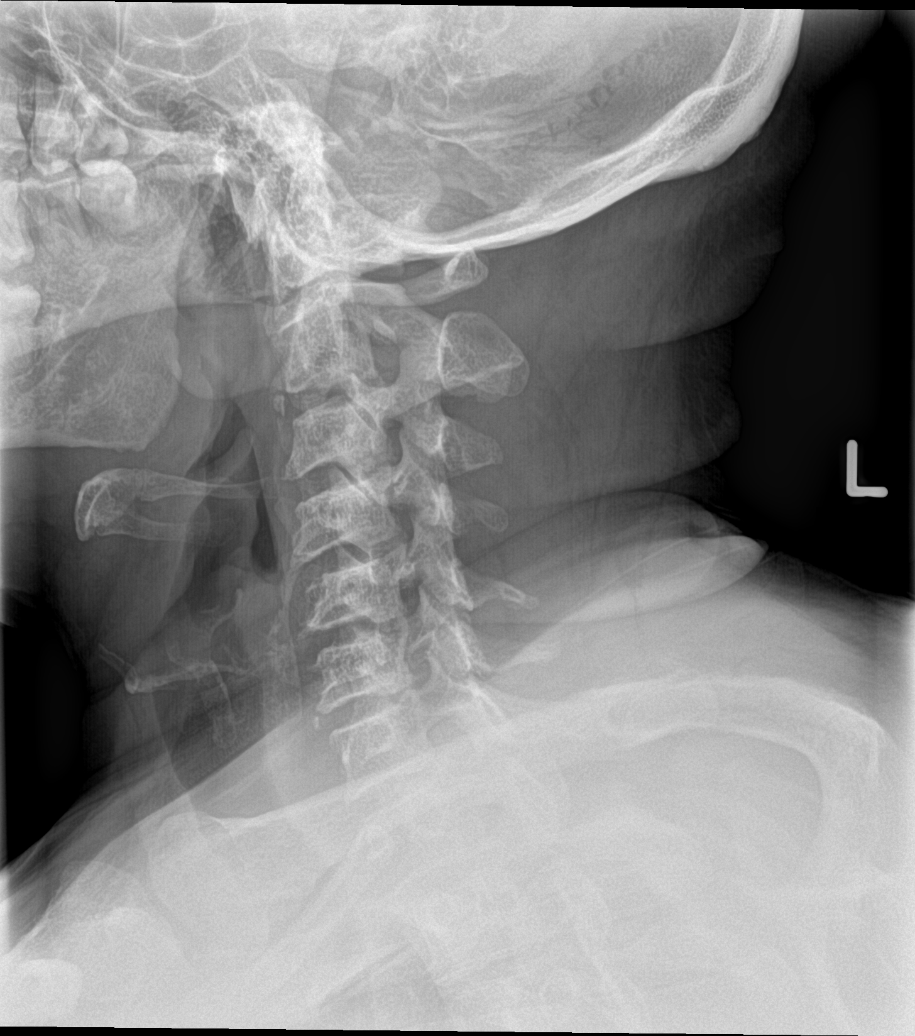

[c-spine ap]
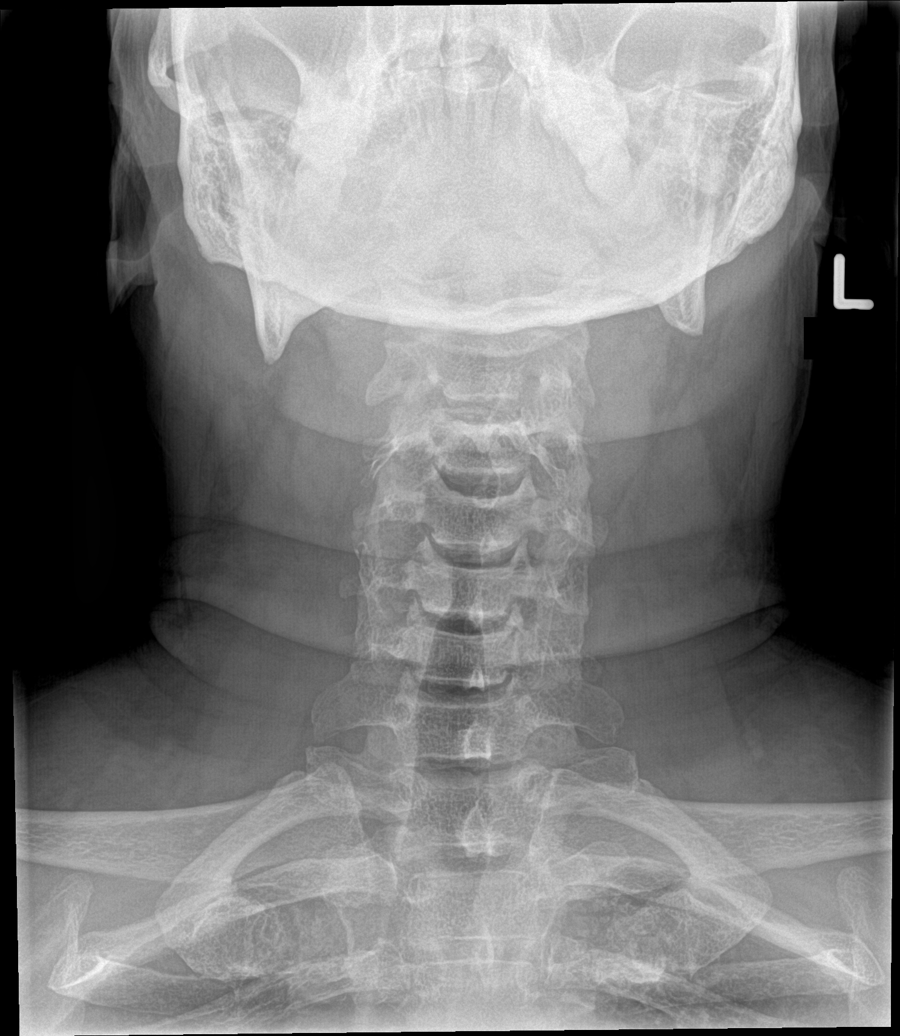

[c-spine open mouth]
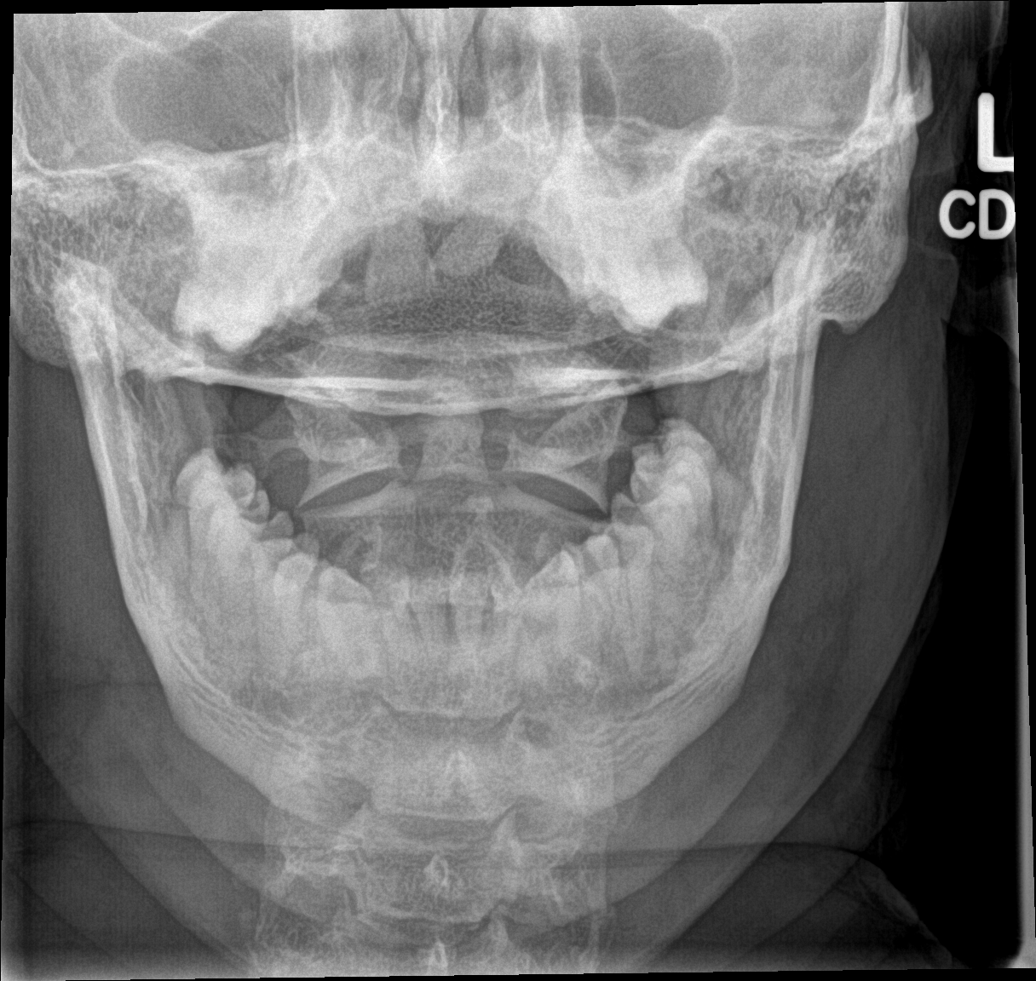

[5 of 5 positions shown; findings below may reference images not displayed]

FINDINGS: Frontal, lateral, open-mouth odontoid, and bilateral oblique views
were obtained. There is no fracture or spondylolisthesis.
Prevertebral soft tissues and predental space regions are normal.
There are anterior osteophytes at C2, C3, C4, C5, and C6. There is
minimal disc space narrowing at C5-6 and C6-7. There is slight exit
foraminal narrowing at C3-4, C4-5, C5-6 and C6-7 bilaterally due to
bony hypertrophy. No erosive change.
IMPRESSION: Relatively mild multilevel osteoarthritic change. No fracture or
spondylolisthesis.

## 2014-12-07 MED ORDER — NAPROXEN 500 MG PO TABS: ORAL_TABLET | ORAL | Status: DC

## 2014-12-07 NOTE — Progress Notes (Signed)
    Subjective:  Jason Rhodes is a 64 y.o. male who presents to the [facility] today with a chief complaint of left arm pain.   HPI:  Left Arm/Shoulder Pain Patient presents with left arm/choulder pain that started about 2-3 weeks ago. No recent trauma, injuries, or strenuous exercise. Pain starts in his deltoid then radiates to his biceps. Pain described as a pressure-like sensation that feels like a blood pressure cuff inflating. Also reports having some numbness and tingling in his left hand. No weakness. No chest pain or shortness of breath. No diaphoresis. Pain occurs every 1-2 hours and lasts for approximately 5-15 minutes. Has tried ibuprofen which helps some.   No personal or family history of blood clot.   ROS: Per HPI  PMH:  The following were reviewed and entered/updated in epic: No past medical history on file. Patient Active Problem List   Diagnosis Date Noted  . Left knee pain 02/13/2014  . OSA (obstructive sleep apnea) 02/13/2014  . AKI (acute kidney injury) 02/13/2014  . Hyperlipidemia 06/19/2013  . Arm pain 06/19/2013  . Sickle cell trait 01/10/2013  . 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 Mild anemia 12/22/2012  . Health care maintenance 12/09/2012  . Epididymitis 11/28/2012  . BPH (benign prostatic hyperplasia) 08/27/2012  . Unspecified essential hypertension 08/27/2012   Past Surgical History  Procedure Laterality Date  . Hernia repair    . Shoulder surgery       Objective:  Physical Exam: BP 152/93 mmHg  Pulse 74  Temp(Src) 98.3 F (36.8 C) (Oral)  Ht 5\' 6"  (1.676 m)  Wt 216 lb (97.977 kg)  BMI 34.88 kg/m2  Gen: NAD, resting comfortably CV: RRR with no murmurs appreciated Lungs: NWOB, CTAB with no crackles, wheezes, or rhonchi GI: Normal bowel sounds present. Soft, Nontender, Nondistended. MSK:  -Left shoulder: No deformities, strength 5/5 in all directions, nontender to palpation. Negative Neer and Hawkin - Left Arm: Mildly tender over biceps. Strength 5/5  with elbow flexion and extension. Sensation grossly intact.  Biceps and triceps reflex 2+ -Left Hand: 5/5 grip strength. Sensation grossly intact.  -Neck: No step offs or deformities. Nontender to palpation. Spurling negative.  Skin: warm, dry, no rashes Neuro: grossly normal, moves all extremities Psych: Normal affect and thought content  EKG: NSR, no ischemic changes  Assessment/Plan:  Arm pain Unclear etiology. Not likely cardiac related (EKG today negative). Concern for neuropathic etiologygiven description. Will treat conservatively with naproxen and rest for 2 weeks. Will also obtain a chest xray to look for possible sources of referred pain. Will obtain cervical films to look for possible sources of nerve impingement.   Will follow up in 2 weeks. If no, improvement, may consider referral to sports medicine/orthopedics, or possibly referral for nerve conductions studies or advanced imaging of cervical spine.     Algis Greenhouse. Jerline Pain, [place] [facility] Resident PGY-2 12/07/2014 5:31 PM

## 2014-12-07 NOTE — Patient Instructions (Signed)
Thank you for coming to the clinic today. It was nice seeing you.  I think you may have some nerve irritation that is causing your symptoms. We will start you on an anti-inflammatory medication called naproxen. Please take one pill twice a day for 2 weeks. We will also obtain a chest xray and an xray of your neck. You will receive a letter in the mail or a call with the results.  Please come back in 2 weeks for follow up  Take Care,  Dr Jerline Pain

## 2014-12-07 NOTE — Assessment & Plan Note (Signed)
Unclear etiology. Not likely cardiac related (EKG today negative). Concern for neuropathic etiologygiven description. Will treat conservatively with naproxen and rest for 2 weeks. Will also obtain a chest xray to look for possible sources of referred pain. Will obtain cervical films to look for possible sources of nerve impingement.   Will follow up in 2 weeks. If no, improvement, may consider referral to sports medicine/orthopedics, or possibly referral for nerve conductions studies or advanced imaging of cervical spine.

## 2014-12-09 ENCOUNTER — Encounter: Payer: Self-pay | Admitting: Family Medicine

## 2014-12-18 DIAGNOSIS — N401 Enlarged prostate with lower urinary tract symptoms: Secondary | ICD-10-CM | POA: Diagnosis not present

## 2014-12-18 DIAGNOSIS — N508 Other specified disorders of male genital organs: Secondary | ICD-10-CM | POA: Diagnosis not present

## 2014-12-18 DIAGNOSIS — G8929 Other chronic pain: Secondary | ICD-10-CM | POA: Diagnosis not present

## 2015-01-11 ENCOUNTER — Other Ambulatory Visit: Payer: Self-pay | Admitting: Obstetrics and Gynecology

## 2015-01-11 DIAGNOSIS — E785 Hyperlipidemia, unspecified: Secondary | ICD-10-CM

## 2015-01-11 DIAGNOSIS — I10 Essential (primary) hypertension: Secondary | ICD-10-CM

## 2015-01-11 MED ORDER — ATORVASTATIN CALCIUM 40 MG PO TABS: 40.0000 mg | ORAL_TABLET | Freq: Every day | ORAL | Status: DC

## 2015-01-11 MED ORDER — AMLODIPINE BESYLATE 10 MG PO TABS: 10.0000 mg | ORAL_TABLET | Freq: Every day | ORAL | Status: DC

## 2015-01-11 MED ORDER — LISINOPRIL 40 MG PO TABS: 40.0000 mg | ORAL_TABLET | Freq: Every day | ORAL | Status: DC

## 2015-01-11 NOTE — Telephone Encounter (Signed)
Need refill on atorvastatin ,amlodipine and isinopril.  Send to [facility] on [address].

## 2015-01-20 ENCOUNTER — Ambulatory Visit (INDEPENDENT_AMBULATORY_CARE_PROVIDER_SITE_OTHER): Payer: Medicare Other | Admitting: Obstetrics and Gynecology

## 2015-01-20 VITALS — BP 142/80 | HR 82 | Temp 99.0°F | Ht 66.0 in | Wt 219.7 lb

## 2015-01-20 DIAGNOSIS — M5412 Radiculopathy, cervical region: Secondary | ICD-10-CM

## 2015-01-20 DIAGNOSIS — I10 Essential (primary) hypertension: Secondary | ICD-10-CM

## 2015-01-20 DIAGNOSIS — N400 Enlarged prostate without lower urinary tract symptoms: Secondary | ICD-10-CM

## 2015-01-20 DIAGNOSIS — G4733 Obstructive sleep apnea (adult) (pediatric): Secondary | ICD-10-CM

## 2015-01-20 HISTORY — DX: Radiculopathy, cervical region: M54.12

## 2015-01-20 LAB — BASIC METABOLIC PANEL
BUN: 12 mg/dL (ref 7–25)
CO2: 29 mmol/L (ref 20–31)
CREATININE: 0.89 mg/dL (ref 0.70–1.25)
Calcium: 9.3 mg/dL (ref 8.6–10.3)
Chloride: 105 mmol/L (ref 98–110)
GLUCOSE: 116 mg/dL — AB (ref 65–99)
POTASSIUM: 4.2 mmol/L (ref 3.5–5.3)
Sodium: 141 mmol/L (ref 135–146)

## 2015-01-20 MED ORDER — GABAPENTIN 100 MG PO CAPS: 100.0000 mg | ORAL_CAPSULE | Freq: Every day | ORAL | Status: DC

## 2015-01-20 MED ORDER — LISINOPRIL-HYDROCHLOROTHIAZIDE 10-12.5 MG PO TABS: 1.0000 | ORAL_TABLET | Freq: Every day | ORAL | Status: DC

## 2015-01-20 MED ORDER — GABAPENTIN 100 MG PO CAPS: 100.0000 mg | ORAL_CAPSULE | Freq: Three times a day (TID) | ORAL | Status: DC

## 2015-01-20 NOTE — Progress Notes (Signed)
    Subjective: Chief Complaint  Patient presents with  . medication refills    HPI: Jason Rhodes is a 64 y.o. presenting to clinic today to discuss the following:  #Medication Refills: -needs refills on medications  #Hypertension: Blood pressure at home: does not monitor Blood pressure today: elevated Taking Meds: yes Side effects: non ROS: Denies headache, dizziness, visual changes, nausea, vomiting, chest pain, abdominal pain or shortness of breath.  #L. Arm pain: -states that he has had an issue with arm for several months -tingling sensation goes from shoulder to finger tips -wants to be tested for diabetes  #BPH: -follows with [facility] urology -has had one surgery on prostate because he did not want a prostatectomy (PSA >14) -PSA increasing again  #OSA: -had sleep study and diagnosed with sever OSA -wife states this was back in March -never received his CPAP machine -she states his apneic episodes are getting longer -home healthcare to set up machine  ROS in HPI.  Past Medical, Surgical, Social, and Family History Reviewed & Updated per EMR.   Objective: BP 142/80 mmHg  Pulse 82  Temp(Src) 99 F (37.2 C) (Oral)  Ht 5\' 6"  (1.676 m)  Wt 219 lb 11.2 oz (99.655 kg)  BMI 35.48 kg/m2  Physical Exam  Constitutional: He is oriented to person, place, and time and well-developed, well-nourished, and in no distress.  HENT:  Head: Normocephalic and atraumatic.  Neck: Normal range of motion. Neck supple. No thyromegaly present.  Cardiovascular: Normal rate, regular rhythm and intact distal pulses.   Pulmonary/Chest: Effort normal and breath sounds normal.  Neurological: He is alert and oriented to person, place, and time. He has normal motor skills, normal strength and intact cranial nerves. He displays no weakness. A sensory deficit is present. Gait normal.  +Spurling test on left  Skin: Skin is warm and dry.    Assessment/Plan: Please see problem based  Assessment and Plan   Orders Placed This Encounter  Procedures  . For home use only DME continuous positive airway pressure (CPAP)    Order Specific Question:  Patient has OSA or probable OSA    Answer:  Yes    Order Specific Question:  Settings    Answer:  Other see comments    Order Specific Question:  CPAP supplies needed    Answer:  Mask, headgear, cushions, filters, heated tubing and water chamber  . Basic Metabolic Panel    Meds ordered this encounter  Medications  . DISCONTD: gabapentin (NEURONTIN) 100 MG capsule    Sig: Take 1 capsule (100 mg total) by mouth 3 (three) times daily.    Dispense:  90 capsule    Refill:  3  . lisinopril-hydrochlorothiazide (PRINZIDE,ZESTORETIC) 10-12.5 MG tablet    Sig: Take 1 tablet by mouth daily.    Dispense:  30 tablet    Refill:  1  . gabapentin (NEURONTIN) 100 MG capsule    Sig: Take 1 capsule (100 mg total) by mouth at bedtime.    Dispense:  30 capsule    Refill:  [place], DO 01/20/2015, 3:26 PM PGY-2, [place]

## 2015-01-20 NOTE — Patient Instructions (Signed)
Here are some of the things we discussed today: -starting you on nerve medication for nerve pain in arm/hand; starting at low dose but can titrate up as needed -Blood work obtained today I will contact you about results -CPAP ordered; call in one week if you do not hear from anyone -BP medication adjusted; please monitor BPs at home  Please schedule a follow-up appointment for 6 weeks for follow-up  Thanks for allowing me to be a part of your care! Dr. Gerarda Fraction    Cervical Radiculopathy Cervical radiculopathy means that a nerve in the neck is pinched or bruised. This can cause pain or loss of feeling (numbness) that runs from your neck to your arm and fingers. HOME CARE Managing Pain  Take over-the-counter and prescription medicines only as told by your doctor.  If directed, put ice on the injured or painful area.  Put ice in a plastic bag.  Place a towel between your skin and the bag.  Leave the ice on for 20 minutes, 2-3 times per day.  If ice does not help, you can try using heat. Take a warm shower or warm bath, or use a heat pack as told by your doctor.  You may try a gentle neck and shoulder massage. Activity  Rest as needed. Follow instructions from your doctor about any activities to avoid.  Do exercises as told by your doctor or physical therapist. General Instructions   If you were given a soft collar, wear it as told by your doctor.  Use a flat pillow when you sleep.  Keep all follow-up visits as told by your doctor. This is important. GET HELP IF:  Your condition does not improve with treatment. GET HELP RIGHT AWAY IF:   Your pain gets worse and is not controlled with medicine.  You lose feeling or feel weak in your hand, arm, face, or leg.  You have a fever.  You have a stiff neck.  You cannot control when you poop or pee (have incontinence).  You have trouble with walking, balance, or talking.   This information is not intended to replace  advice given to you by your health care provider. Make sure you discuss any questions you have with your health care provider.   Document Released: 03/02/2011 Document Revised: 12/02/2014 Document Reviewed: 05/07/2014 Elsevier Interactive Patient Education [employer].

## 2015-01-23 ENCOUNTER — Encounter: Payer: Self-pay | Admitting: Obstetrics and Gynecology

## 2015-01-23 NOTE — Assessment & Plan Note (Deleted)
Sleep study 05/2014 - severe OSA

## 2015-01-23 NOTE — Assessment & Plan Note (Signed)
Follows with [facility] urology. PSA trending back up. He is s/p scrapping of prostate but had declined prostatectomy.

## 2015-01-23 NOTE — Assessment & Plan Note (Addendum)
Continues to endorse left arm pain with radicular symptoms. Believe this to be neuropathic in nature. Prior cervical films with OA. Spurling's maneuver reproduces symptoms. History and physical consistent with cervical radiculopathy/nerve impeingment. No concerning signs at this time. Strength intact; some sensation abnormalities. -continue naproxen -gabapentin 100mg  at bedtime initiated; will titrate as appropraite -glucose will be checked on BMP; follow-up A1c if needed -consider referral if symptoms not well controlled or worsens - conduction studies, advanced imaging or neurosurgery -also could do short course of steroids to decrease inflammation if symptoms persist -handout given -follow-up 6 weeks

## 2015-01-23 NOTE — Assessment & Plan Note (Signed)
Continues to endorse symptoms including apneic episodes at tonight, daytime fatigue, snoring.  -order placed for home health to order CPAP

## 2015-01-23 NOTE — Assessment & Plan Note (Signed)
BPs have been elevated for the last few visits. Patient does not monitor it at home. Maxed out on current two BP mediations -switch to lisinopril-hctz  -continue amlodipine -BMET today -monitor BPs

## 2015-01-29 ENCOUNTER — Telehealth: Payer: Self-pay

## 2015-01-29 NOTE — Telephone Encounter (Signed)
-----   Message from [facility] sent at 01/22/2015 11:53 AM EDT ----- Regarding: RE: CPAP Machine Looks like the only thing missing is the pressure setting on the order. There is a suggestion for the pressure setting on the sleep study. Can you please have that added to the order? Thanks! ----- Message -----    From: Ottis Stain, CMA    Sent: 01/22/2015   9:05 AM      To: Melissa Stenson Subject: CPAP Machine                                   Good Morning,  Dr. Gerarda Fraction has ordered a CPAP machine. All of the information is in Epic.  If something is missing please let me know.  Thank you, Erskine Emery, [place]

## 2015-01-29 NOTE — Telephone Encounter (Signed)
Please see below not from [facility]. Ottis Stain, CMA

## 2015-01-29 NOTE — Telephone Encounter (Signed)
Melissa Stenson sent a message saying the pressure setting needs to be added to the order. The sleep study has a suggestion for pressure setting. Ottis Stain, CMA

## 2015-02-02 NOTE — Telephone Encounter (Signed)
Appropriate order and forms updated and sent back to [facility] who is coordinating care. Hopefully everything is as needed for patient to get CPAP.

## 2015-02-02 NOTE — Telephone Encounter (Signed)
Pt wanted dr Gerarda Fraction know he called to ask about status of Cpap machine

## 2015-02-02 NOTE — Telephone Encounter (Signed)
Will forward to MD.  She will need to add the pressure setting to cpap order before this can be completed. Jason Rhodes,CMA

## 2015-02-16 ENCOUNTER — Telehealth: Payer: Self-pay | Admitting: Obstetrics and Gynecology

## 2015-02-16 DIAGNOSIS — G4733 Obstructive sleep apnea (adult) (pediatric): Secondary | ICD-10-CM

## 2015-02-16 NOTE — Telephone Encounter (Signed)
-----   Message from [facility] sent at 02/11/2015  2:57 PM EST ----- Unfortunately, at this point, according to Medicare guidelines, the patient will either need to start over again with a new sleep study or he will have to provide a valid reason to you for the delay in start of treatment which will need to be documented in your office visit note.  I agree that the situation is unfortunate.  Let me know what you decide Thanks . ----- Message -----    From: Katheren Shams, DO    Sent: 02/11/2015   2:51 PM      To: Melissa Stenson  [place]-  Patient was just seen by myself in clinic on 01/20/15. I am his new PCP provider. I am unsure why there was such a delay in getting the patient's equipment. He came to me to establish care for the first time and was hoping I could get the process started. I believe I stated something to that effect in my note. It is unfortunate that patient has fell through the cracks because his sleep apnea is severe and this could be risking his life by not having it. Patient and his wife have more information on why there was a delay.  Hopefully this is sufficient but if not we need to do what we need to in order to get patient's equipment.    ----- Message -----    From: Darlina Guys    Sent: 02/11/2015   2:37 PM      To: Katheren Shams, DO  Hello Dr. Gerarda Fraction.  We received your CPAP order for this patient.  He has Medicare and they require that if it has been longer than 6 months between the sleep study and the CPAP order, the pt must come in for an office visit to document why there has been a delay in start of treatment.  The reasons for delay must be very valid, such as hospitalization, caring for family member, in SNF, cost, etc. or he will have to start the process all over again.  Please let me know if you have any questions.  Thanks [place]

## 2015-02-16 NOTE — Telephone Encounter (Signed)
Please inform patient that Medicare is not going to pay for CPAP without getting another sleep study. The gap between his first study and now has been too long. Has to be <6 months. Only exception is if we can give a valid reason for why their was a delay in getting a CPAP which I am unaware of. I will place an order for another sleep study. My only concern is that he may not be able to get two sleep studies covered cost wise in the same year. If that becomes a problem please let me know so I can see what we can do. Thanks

## 2015-02-17 NOTE — Telephone Encounter (Signed)
Spoke with patient and he is aware of this.  Will plan to discuss in more detail if necessary at this next appt.  Jazmin Hartsell,CMA

## 2015-02-23 ENCOUNTER — Encounter: Payer: Self-pay | Admitting: Obstetrics and Gynecology

## 2015-02-23 ENCOUNTER — Telehealth: Payer: Self-pay | Admitting: *Deleted

## 2015-02-23 ENCOUNTER — Ambulatory Visit (INDEPENDENT_AMBULATORY_CARE_PROVIDER_SITE_OTHER): Payer: Medicare Other | Admitting: Obstetrics and Gynecology

## 2015-02-23 VITALS — BP 128/89 | HR 85 | Temp 98.3°F | Wt 218.9 lb

## 2015-02-23 DIAGNOSIS — I10 Essential (primary) hypertension: Secondary | ICD-10-CM

## 2015-02-23 DIAGNOSIS — E119 Type 2 diabetes mellitus without complications: Secondary | ICD-10-CM | POA: Insufficient documentation

## 2015-02-23 DIAGNOSIS — R739 Hyperglycemia, unspecified: Secondary | ICD-10-CM

## 2015-02-23 DIAGNOSIS — E118 Type 2 diabetes mellitus with unspecified complications: Secondary | ICD-10-CM | POA: Insufficient documentation

## 2015-02-23 DIAGNOSIS — G4733 Obstructive sleep apnea (adult) (pediatric): Secondary | ICD-10-CM | POA: Diagnosis not present

## 2015-02-23 HISTORY — DX: Type 2 diabetes mellitus without complications: E11.9

## 2015-02-23 LAB — POCT GLYCOSYLATED HEMOGLOBIN (HGB A1C): Hemoglobin A1C: 7.1

## 2015-02-23 MED ORDER — GLUCOSE BLOOD VI STRP: ORAL_STRIP | Status: DC

## 2015-02-23 MED ORDER — ONETOUCH ULTRASOFT LANCETS MISC: Status: DC

## 2015-02-23 MED ORDER — METFORMIN HCL 500 MG PO TABS: 500.0000 mg | ORAL_TABLET | Freq: Every day | ORAL | Status: DC

## 2015-02-23 MED ORDER — ONETOUCH ULTRA 2 W/DEVICE KIT
PACK | Status: DC
Start: 2015-02-23 — End: 2015-02-23

## 2015-02-23 MED ORDER — ONETOUCH ULTRA 2 W/DEVICE KIT: PACK | Status: AC

## 2015-02-23 NOTE — Telephone Encounter (Signed)
Patient's wife called stating that [facility] did not received the Rx for Metformin and that the diagnosis code on the diabetic supplies were not on the prescriptions.  Please resend the Rx for diabetic supplies with a diagnosis code and the Rx for metformin.  Derl Barrow, RN

## 2015-02-23 NOTE — Patient Instructions (Addendum)
I am pleased to hear that things are going well for you.  Here are some of the things we discussed today: -A1c today was 7.1. This would be considered diabetic range -starting you on medication today and referring you for diabetic education  -supplies sent to pharmacy -Someone will call you about the sleep study it usually takes a couple weeks and with the holidays probably behind. If you do not hear anything in the next 2 weeks call our clinic -continue to monitor BP. Keep drinking plenty of water.  Please schedule a follow-up appointment for 6 weeks or sooner as needed.    Thanks for allowing me to be a part of your care! Dr. Gerarda Fraction  Type 2 Diabetes Mellitus, Adult Type 2 diabetes mellitus is a long-term (chronic) disease. In type 2 diabetes:  The pancreas does not make enough of a hormone called insulin.  The cells in the body do not respond as well to the insulin that is made.  Both of the above can happen. Normally, insulin moves sugars from food into tissue cells. This gives you energy. If you have type 2 diabetes, sugars cannot be moved into tissue cells. This causes high blood sugar (hyperglycemia).  Your doctors will set personal treatment goals for you based on your age, your medicines, how long you have had diabetes, and any other medical conditions you have. Generally, the goal of treatment is to maintain the following blood glucose levels:  Before meals (preprandial): 80-130 mg/dL.  After meals (postprandial): below 180 mg/dL.  A1c: less than 6.5-7%. HOME CARE  Have your hemoglobin A1c level checked twice a year. The level shows if your diabetes is under control or out of control.  Test your blood sugar level every day as told by your doctor.  Check your ketone levels by testing your pee (urine) when you are sick and as told.  Take your diabetes or insulin medicine as told by your doctor.  Never run out of insulin.  Adjust how much insulin you give yourself  based on how many carbs (carbohydrates) you eat. Carbs are in many foods, such as fruits, vegetables, whole grains, and dairy products.  Have a healthy snack between every healthy meal. Have 3 meals and 3 snacks a day.  Lose weight if you are overweight.  Carry a medical alert card or wear your medical alert jewelry.  Carry a 15-gram carb snack with you at all times. Examples include:  Glucose pills, 3 or 4.  Glucose gel, 15-gram tube.  Raisins, 2 tablespoons (24 grams).  Jelly beans, 6.  Animal crackers, 8.  Regular (not diet) pop, 4 ounces (120 milliliters).  Gummy treats, 9.  Notice low blood sugar (hypoglycemia) symptoms, such as:  Shaking (tremors).  Trouble thinking clearly.  Sweating.  Faster heart rate.  Headache.  Dry mouth.  Hunger.  Crabbiness (irritability).  Being worried or tense (anxious).  Restless sleep.  A change in speech or coordination.  Confusion.  Treat low blood sugar right away. If you are alert and can swallow, follow the 15:15 rule:  Take 15-20 grams of a rapid-acting glucose or carb. This includes glucose gel, glucose pills, or 4 ounces (120 milliliters) of fruit juice, regular pop, or low-fat milk.  Check your blood sugar level 15 minutes after taking the glucose.  Take 15-20 grams more of glucose if the repeat blood sugar level is still 70 mg/dL (milligrams/deciliter) or below.  Eat a meal or snack within 1 hour of the blood sugar  levels going back to normal.  Notice early symptoms of high blood sugar, such as:  Being really thirsty or drinking a lot (polydipsia).  Peeing a lot (polyuria).  Do at least 150 minutes of physical activity a week or as told.  Split the 150 minutes of activity up during the week. Do not do 150 minutes of activity in one day.  Perform exercises, such as weight lifting, at least 2 times a week or as told.  Spend no more than 90 minutes at one time inactive.  Adjust your insulin or food  intake as needed if you start a new exercise or sport.  Follow your sick-day plan when you are not able to eat or drink as usual.  Do not smoke, chew tobacco, or use electronic cigarettes.  Women who are not pregnant should drink no more than 1 drink a day. Men should drink no more than 2 drinks a day.  Only drink alcohol with food.  Ask your doctor if alcohol is safe for you.  Tell your doctor if you drink alcohol several times during the week.  See your doctor regularly.  Schedule an eye exam soon after you are told you have diabetes. Schedule exams once every year.  Check your skin and feet every day. Check for cuts, bruises, redness, nail problems, bleeding, blisters, or sores. A doctor should do a foot exam once a year.  Brush your teeth and gums twice a day. Floss once a day. Visit your dentist regularly.  Share your diabetes plan with your workplace or school.  Keep your shots that fight diseases (vaccines) up to date.  Get a flu (influenza) shot every year.  Get a pneumonia shot. If you are 55 years of age or older and you have never gotten a pneumonia shot, you might need to get two shots.  Ask your doctor which other shots you should get.  Learn how to deal with stress.  Get diabetes education and support as needed.  Ask your doctor for special help if:  You need help to maintain or improve how you do things on your own.  You need help to maintain or improve the quality of your life.  You have foot or hand problems.  You have trouble cleaning yourself, dressing, eating, or doing physical activity. GET HELP IF:  You are unable to eat or drink for more than 6 hours.  You feel sick to your stomach (nauseous) or throw up (vomit) for more than 6 hours.  Your blood sugar level is over 240 mg/dL.  There is a change in mental status.  You get another serious illness.  You have watery poop (diarrhea) for more than 6 hours.  You have been sick or have had a  fever for 2 or more days and are not getting better.  You have pain when you are active. GET HELP RIGHT AWAY IF:  You have trouble breathing.  Your ketone levels are higher than your doctor says they should be. MAKE SURE YOU:  Understand these instructions.  Will watch your condition.  Will get help right away if you are not doing well or get worse.   This information is not intended to replace advice given to you by your health care provider. Make sure you discuss any questions you have with your health care provider.   Document Released: 12/21/2007 Document Revised: 07/28/2014 Document Reviewed: 10/13/2011 Elsevier Interactive Patient Education [employer].

## 2015-02-23 NOTE — Assessment & Plan Note (Signed)
New diagnosis of T2DM. A1c today was 7.1. Last A1c 2 years ago was 5.0. Had elevated glucose on BMP done at last visit. Patient is asymptomatic.  -educated patient on diagnosis of diabetes -will place referral for diabetic education -metformin 567m daily started -blood glucose monitoring kit and supplies ordered -discussed and encouraged lifestyle changes -handout given -follow-up in 4 weeks

## 2015-02-23 NOTE — Telephone Encounter (Signed)
Medications resent with diagnosis code. Please let me know if any other issues arise.

## 2015-02-23 NOTE — Assessment & Plan Note (Signed)
BPs much improved today and at goal. Patient asymptomatic on new medications. Continue regimen.

## 2015-02-23 NOTE — Telephone Encounter (Signed)
Left message for patient and wife that medications were resent correctly.  Derl Barrow, RN

## 2015-02-23 NOTE — Progress Notes (Signed)
     Subjective: Chief Complaint  Patient presents with  . Follow-up    Lab results; Hypertension; Sleep Apnea referral - Pt states he never received phone call from Sleep Center    HPI: Jason Rhodes is a 64 y.o. presenting to clinic today to discuss the following:  #Sleep Apnea: -waiting for call back from sleep center -referral placed on 11/22 -has to redo sleep study because cannot get his cpap without a recent sleep study (needs to be within last 6 months)  #Hypertension: Blood pressure at home: <140/90 Blood pressure today: within normal limits Taking Meds: yes, started on new BP last visit that is controlling his pressures well Side effects: none, was concerned about cramping in the beginning but improved with increased water intake ROS: Denies headache, dizziness, visual changes, nausea, vomiting, chest pain, abdominal pain or shortness of breath.  #Blood sugars: -concerned about elevated blood sugars he had on recent lb work -wondering if he has diabetes -last A1c checked in 2014 was 5.0 -ROS: denies fever, chills, dizziness, LOC, polyuria, polydipsia, chest pain, visual disturbances. -does continue to endorse some left arm tingling -- diagnosed with cervical radiculopathy at last visit   ROS in HPI.  Past Medical, Surgical, Social, and Family History Reviewed & Updated per EMR.   Objective: BP 128/89 mmHg  Pulse 85  Temp(Src) 98.3 F (36.8 C) (Oral)  Wt 218 lb 14.4 oz (99.292 kg)  Physical Exam  Constitutional: He is well-developed, well-nourished, and in no distress.  obese  HENT:  Head: Normocephalic and atraumatic.  Eyes: EOM are normal.  Cardiovascular: Normal rate, regular rhythm, normal heart sounds and intact distal pulses.   Pulmonary/Chest: Effort normal and breath sounds normal.  Neurological: He is alert.  Skin: Skin is warm and dry.  Psychiatric: Mood and affect normal.   Results for orders placed or performed in visit on 02/23/15 (from the  past 72 hour(s))  POCT glycosylated hemoglobin (Hb A1C)     Status: None   Collection Time: 02/23/15 11:40 AM  Result Value Ref Range   Hemoglobin A1C 7.1     Assessment/Plan: Please see problem based Assessment and Plan   Orders Placed This Encounter  Procedures  . Ambulatory referral to diabetic education    Referral Priority:  Routine    Referral Type:  Consultation    Referral Reason:  Specialty Services Required    Number of Visits Requested:  1  . POCT glycosylated hemoglobin (Hb A1C)    Meds ordered this encounter  Medications  . Blood Glucose Monitoring Suppl (ONE TOUCH ULTRA 2) W/DEVICE KIT    Sig: Check sugars as directed by provider    Dispense:  1 each    Refill:  0  . glucose blood test strip    Sig: Use as instructed    Dispense:  100 each    Refill:  12  . Lancets (ONETOUCH ULTRASOFT) lancets    Sig: Use as instructed    Dispense:  100 each    Refill:  12  . metFORMIN (GLUCOPHAGE) 500 MG tablet    Sig: Take 1 tablet (500 mg total) by mouth daily with breakfast.    Dispense:  90 tablet    Refill:  0     Luiz Blare, DO 02/23/2015, 3:43 PM PGY-2, [place]

## 2015-02-23 NOTE — Assessment & Plan Note (Signed)
Pending repeat sleep study so that CPAP can be ordered. Informed patient of appropriate wait time for a call back.

## 2015-02-24 ENCOUNTER — Telehealth: Payer: Self-pay | Admitting: Obstetrics and Gynecology

## 2015-02-24 DIAGNOSIS — G4733 Obstructive sleep apnea (adult) (pediatric): Secondary | ICD-10-CM

## 2015-02-24 NOTE — Telephone Encounter (Signed)
Pt received call from [facility] sleep study center to schedule an appt Baxter Flattery [phone].  Told  pt  He needed to call dr back and have the referral redone so he could go [place] to have it done rather than [place]

## 2015-02-24 NOTE — Telephone Encounter (Signed)
Re-ordered sleep study for [facility]. Thanks!

## 2015-02-24 NOTE — Telephone Encounter (Signed)
Will forward to PCP to see if she can put new sleep study referral for [facility] instead of [place]. Thomasa Heidler, CMA.

## 2015-02-24 NOTE — Telephone Encounter (Signed)
Will forward to referral coordinator to schedule appt for sleep study in [place]. Dyland Panuco, CMA.

## 2015-02-25 NOTE — Telephone Encounter (Signed)
I do not scheduled sleep studies. Once order is placed, [place] sleep center will contact patient to schedule.

## 2015-03-01 ENCOUNTER — Telehealth: Payer: Self-pay | Admitting: Obstetrics and Gynecology

## 2015-03-01 NOTE — Telephone Encounter (Signed)
Pt called because he is checking on the status of his referral to see someone at the [facility] and also the sleep study called them but t is out of [facility] and they would like to go to the one here in [place] . jw

## 2015-03-02 NOTE — Telephone Encounter (Signed)
Spoke with patient and informed him that the diabetes and nutrition will be calling once they get to him from their workqueue.  Patient voiced understanding and I gave him the number to call to check on the status. Georgetta Crafton,CMA

## 2015-03-04 ENCOUNTER — Encounter: Payer: Medicare Other | Attending: Family Medicine

## 2015-03-04 VITALS — Ht 66.0 in | Wt 218.4 lb

## 2015-03-04 DIAGNOSIS — E119 Type 2 diabetes mellitus without complications: Secondary | ICD-10-CM | POA: Insufficient documentation

## 2015-03-09 NOTE — Progress Notes (Signed)
Patient was seen on 03/04/15 for the complete diabetes self-management series at [facility].   Handouts given during class include:  Living Well with Diabetes book  Carb Counting and Meal Planning book  Meal Plan Card  Carbohydrate guide  Meal planning worksheet  Low Sodium Flavoring Tips  The diabetes portion plate  Low Carbohydrate Snack Suggestions  A1c to eAG Conversion Chart  Diabetes Medications  Stress Management  Diabetes Recommended Care Schedule  Diabetes Success Plan  Core Class Satisfaction Survey  The following learning objectives were met by the patient during this course:  Describe diabetes  State some common risk factors for diabetes  Defines the role of glucose and insulin  Identifies type of diabetes and pathophysiology  Describe the relationship between diabetes and cardiovascular risk  State the members of the Healthcare Team  States the rationale for glucose monitoring  State when to test glucose  State their individual Target Range  State the importance of logging glucose readings  Describe how to interpret glucose readings  Identifies A1C target  Explain the correlation between A1c and eAG values  State symptoms and treatment of high blood glucose  State symptoms and treatment of low blood glucose  Explain proper technique for glucose testing  Identifies proper sharps disposal  Describe the role of different macronutrients on glucose  Explain how carbohydrates affect blood glucose  State what foods contain the most carbohydrates  Demonstrate carbohydrate counting  Demonstrate how to read Nutrition Facts food label  Describe effects of various fats on heart health  Describe the importance of good nutrition for health and healthy eating strategies  Describe techniques for managing your shopping, cooking and meal planning  List strategies to follow meal plan when dining  out  Describe the effects of alcohol on glucose and how to use it safely . State the amount of activity recommended for healthy living . Describe activities suitable for individual needs . Identify ways to regularly incorporate activity into daily life . Identify barriers to activity and ways to over come these barriers  Identify diabetes medications being personally used and their primary action for lowering glucose and possible side effects . Describe role of stress on blood glucose and develop strategies to address psychosocial issues . Identify diabetes complications and ways to prevent them  Explain how to manage diabetes during illness . Evaluate success in meeting personal goal . Establish 2-3 goals that they will plan to diligently work on until they return for the  41-month follow-up visit  Goals:   I will count my carb choices at most meals and snacks  I will be active 30 minutes or more 5 times a week  I will take my diabetes medications as scheduled  I will eat less unhealthy fats by eating less breads  I will test my glucose at least 2 times a day, 7 days a week  To help manage stress I will  walk at least 5 times a week  Your patient has identified these potential barriers to change:  None noted  Your patient has identified their diabetes self-care support plan as  Family Support  Plan: Follow up as needed

## 2015-03-11 ENCOUNTER — Ambulatory Visit: Payer: Medicare Other

## 2015-03-17 DIAGNOSIS — N401 Enlarged prostate with lower urinary tract symptoms: Secondary | ICD-10-CM | POA: Diagnosis not present

## 2015-03-17 DIAGNOSIS — R319 Hematuria, unspecified: Secondary | ICD-10-CM | POA: Diagnosis not present

## 2015-03-17 DIAGNOSIS — G8929 Other chronic pain: Secondary | ICD-10-CM | POA: Diagnosis not present

## 2015-03-17 DIAGNOSIS — N50819 Testicular pain, unspecified: Secondary | ICD-10-CM | POA: Diagnosis not present

## 2015-03-18 ENCOUNTER — Other Ambulatory Visit: Payer: Self-pay | Admitting: Obstetrics and Gynecology

## 2015-03-18 ENCOUNTER — Ambulatory Visit: Payer: Medicare Other

## 2015-04-08 ENCOUNTER — Ambulatory Visit (HOSPITAL_BASED_OUTPATIENT_CLINIC_OR_DEPARTMENT_OTHER): Payer: Medicare Other | Attending: Family Medicine

## 2015-04-08 DIAGNOSIS — G4733 Obstructive sleep apnea (adult) (pediatric): Secondary | ICD-10-CM | POA: Diagnosis not present

## 2015-04-08 DIAGNOSIS — R0683 Snoring: Secondary | ICD-10-CM | POA: Insufficient documentation

## 2015-04-11 DIAGNOSIS — G4733 Obstructive sleep apnea (adult) (pediatric): Secondary | ICD-10-CM | POA: Diagnosis not present

## 2015-04-11 NOTE — Progress Notes (Signed)
Patient Name: Jason Rhodes, Jason Rhodes Date: 04/08/2015 Gender: Male D.O.B: April 17, 1950 Age (years): 2 Referring Provider: Andrena Mews Height (inches): 66 Interpreting Physician: Baird Lyons MD, ABSM Weight (lbs): 219 RPSGT: Joni Reining BMI: 35 MRN: [id number] Neck Size: 18.50 CLINICAL INFORMATION Sleep Study Type: Split Night CPAP Indication for sleep study: OSA Epworth Sleepiness Score: 18  SLEEP STUDY TECHNIQUE As per the AASM Manual for the Scoring of Sleep and Associated Events v2.3 (April 2016) with a hypopnea requiring 4% desaturations. The channels recorded and monitored were frontal, central and occipital EEG, electrooculogram (EOG), submentalis EMG (chin), nasal and oral airflow, thoracic and abdominal wall motion, anterior tibialis EMG, snore microphone, electrocardiogram, and pulse oximetry. Continuous positive airway pressure (CPAP) was initiated when the patient met split night criteria and was titrated according to treat sleep-disordered breathing.  MEDICATIONS Medications taken by the patient : charted for review   Medications administered by patient during sleep study : No sleep medicine administered.  RESPIRATORY PARAMETERS Diagnostic Total AHI (/hr): 62.1 RDI (/hr): 62.1 OA Index (/hr): 50.1 CA Index (/hr): 0.0 REM AHI (/hr): 52.4 NREM AHI (/hr): 65.2 Supine AHI (/hr): 67.9 Non-supine AHI (/hr): 51.09 Min O2 Sat (%): 60.00 Mean O2 (%): 85.66 Time below 88% (min): 88.5   Titration Optimal Pressure (cm): 11 AHI at Optimal Pressure (/hr): 2.3 Min O2 at Optimal Pressure (%): 85.0 Supine % at Optimal (%): 63 Sleep % at Optimal (%): 92    SLEEP ARCHITECTURE The recording time for the entire night was 371.3 minutes. During a baseline period of 168.0 minutes, the patient slept for 146.0 minutes in REM and nonREM, yielding a sleep efficiency of 86.9%. Sleep onset after lights out was 6.3 minutes with a REM latency of 84.0 minutes. The patient spent 3.08% of the  night in stage N1 sleep, 72.60% in stage N2 sleep, 0.00% in stage N3 and 24.32% in REM. During the titration period of 200.5 minutes, the patient slept for 188.5 minutes in REM and nonREM, yielding a sleep efficiency of 94.0%. Sleep onset after CPAP initiation was 0.0 minutes with a REM latency of 55.0 minutes. The patient spent 5.31% of the night in stage N1 sleep, 71.88% in stage N2 sleep, 0.00% in stage N3 and 22.82% in REM.  CARDIAC DATA The 2 lead EKG demonstrated sinus rhythm. The mean heart rate was 65.25 beats per minute. Other EKG findings include: None.  LEG MOVEMENT DATA The total Periodic Limb Movements of Sleep (PLMS) were 0. The PLMS index was 0.00 .  IMPRESSIONS - Severe obstructive sleep apnea occurred during the diagnostic portion of the study (AHI = 62.1/hour). An optimal PAP pressure was selected for this patient ( 11 cm of water) - No significant central sleep apnea occurred during the diagnostic portion of the study (CAI = 0.0/hour). - Severe oxygen desaturation was noted during the diagnostic portion of the study (Min O2 = 60.00%). - The patient snored with Loud snoring volume during the diagnostic portion of the study. - No cardiac abnormalities were noted during this study. - Clinically significant periodic limb movements did not occur during sleep.  DIAGNOSIS - Obstructive Sleep Apnea (327.23 [G47.33 ICD-10])  RECOMMENDATIONS - Trial of CPAP therapy on 11 cm H2O with a Large size Resmed Nasal Mask Airfit N20 mask and heated humidification. - Manage as a chronic health problem for long-term supervision - Avoid alcohol, sedatives and other CNS depressants that may worsen sleep apnea and disrupt normal sleep architecture. - Sleep hygiene should be reviewed to assess  factors that may improve sleep quality. - Weight management and regular exercise should be initiated or continued.   Deneise Lever Diplomate, American Board of Sleep Medicine  ELECTRONICALLY SIGNED  ON:  04/11/2015, 4:12 PM [place] PH: [phone]   FX: [phone] [place]

## 2015-04-14 DIAGNOSIS — N411 Chronic prostatitis: Secondary | ICD-10-CM | POA: Diagnosis not present

## 2015-04-14 DIAGNOSIS — Z9889 Other specified postprocedural states: Secondary | ICD-10-CM | POA: Diagnosis not present

## 2015-04-14 DIAGNOSIS — N529 Male erectile dysfunction, unspecified: Secondary | ICD-10-CM | POA: Diagnosis not present

## 2015-04-14 DIAGNOSIS — M7989 Other specified soft tissue disorders: Secondary | ICD-10-CM | POA: Diagnosis not present

## 2015-04-14 DIAGNOSIS — N50811 Right testicular pain: Secondary | ICD-10-CM | POA: Diagnosis not present

## 2015-04-14 DIAGNOSIS — R319 Hematuria, unspecified: Secondary | ICD-10-CM | POA: Diagnosis not present

## 2015-05-03 ENCOUNTER — Telehealth: Payer: Self-pay | Admitting: Obstetrics and Gynecology

## 2015-05-03 DIAGNOSIS — G4733 Obstructive sleep apnea (adult) (pediatric): Secondary | ICD-10-CM

## 2015-05-03 NOTE — Telephone Encounter (Signed)
Will forward to MD.  CPAP final results on 04/13/15. Yolani Vo,CMA

## 2015-05-03 NOTE — Telephone Encounter (Signed)
Wife calls, checking the status of the CPAP order. Did not find any order for CPAP.

## 2015-05-04 NOTE — Telephone Encounter (Signed)
DME order placed. Is there anything more I need to do? Usually home health sets everything up so please send them the sleep study results so they have the titrations as well.

## 2015-05-04 NOTE — Telephone Encounter (Signed)
Sent a message to [employer] with [facility] regarding this order, will wait to hear from her if she can access it or if we will need to fax over the information. Jazmin Hartsell,CMA

## 2015-05-05 NOTE — Telephone Encounter (Signed)
Spoke with wife and informed her that per [facility] they were able to pull order and study results from [facility] and will be able to proceed with order.  Jazmin Hartsell,CMA

## 2015-05-28 ENCOUNTER — Encounter: Payer: Self-pay | Admitting: Obstetrics and Gynecology

## 2015-05-28 ENCOUNTER — Ambulatory Visit (INDEPENDENT_AMBULATORY_CARE_PROVIDER_SITE_OTHER): Payer: Medicare Other | Admitting: Obstetrics and Gynecology

## 2015-05-28 VITALS — BP 123/82 | HR 68 | Temp 98.1°F | Wt 213.7 lb

## 2015-05-28 DIAGNOSIS — I10 Essential (primary) hypertension: Secondary | ICD-10-CM

## 2015-05-28 DIAGNOSIS — G4733 Obstructive sleep apnea (adult) (pediatric): Secondary | ICD-10-CM | POA: Diagnosis not present

## 2015-05-28 DIAGNOSIS — E119 Type 2 diabetes mellitus without complications: Secondary | ICD-10-CM | POA: Diagnosis present

## 2015-05-28 LAB — POCT GLYCOSYLATED HEMOGLOBIN (HGB A1C): HEMOGLOBIN A1C: 5.3

## 2015-05-28 MED ORDER — METFORMIN HCL 500 MG PO TABS: 500.0000 mg | ORAL_TABLET | Freq: Every day | ORAL | Status: DC

## 2015-05-28 NOTE — Assessment & Plan Note (Signed)
Patient now with CPAP. OSA is much better controlled and patient's symptoms greatly improved. Will continue to monitor.

## 2015-05-28 NOTE — Assessment & Plan Note (Signed)
BP at goal for patient. He has no side effects from current regimen. Continue management.

## 2015-05-28 NOTE — Progress Notes (Signed)
     Subjective: Chief Complaint  Patient presents with  . Follow-up    DM;CPAP;HTN    HPI: Jason Rhodes is a 65 y.o. presenting to clinic today to discuss the following:  #OSA/Sleep: -Paitnet now with Cpap machine for his severe OSA -states he is leeping much better ow with machine -denies any more daytime solmnelece or apneic episodes -wife states he does not snore anymore -he states he is sleeping well with any nighttime awakaenings  #Diabetes:  Sugars - High at home: 200s    Low at home: 80s Taking medications: Yes metformin once daily, been out of medication for a week and a half now Side effects: Denies side effects or hypoglycemia On statin Last eye exam: Unknown, wants referral to opthalmology  Last foot exam: Unknown, due for one ROS: denies fever, chills, dizziness, LOC, polyuria, polydipsia,  chest pain, visual disturbances. 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  Health Maintenance: Due for a physical    ROS noted in HPI.  Past Medical, Surgical, Social, and Family History Reviewed & Updated per EMR.   Objective: BP 123/82 mmHg  Pulse 68  Temp(Src) 98.1 F (36.7 C) (Oral)  Wt 213 lb 11.2 oz (96.934 kg) Vitals and nursing notes reviewed  Physical Exam  Constitutional: He is well-developed, well-nourished, and in no distress.  Cardiovascular: Normal rate, regular rhythm, normal heart sounds and intact distal pulses.   Pulmonary/Chest: Effort normal and breath sounds normal.  Skin: Skin is warm and dry.    Diabetic Foot Exam - Simple   Simple Foot Form  Diabetic Foot exam was performed with the following findings:  Yes 05/28/2015 10:57  AM  Visual Inspection  Sensation Testing  Pulse Check  Comments  Foot exam normal       Results for orders placed or performed in visit on 05/28/15 (from the past 72 hour(s))  POCT glycosylated hemoglobin (Hb A1C)     Status: None   Collection Time: 05/28/15  9:40 AM  Result Value Ref Range   Hemoglobin A1C 5.3     Assessment/Plan: Please see problem based Assessment and Plan  Health Maintainance: Due for a physical. Patient encouraged to schedule.     Orders Placed This Encounter  Procedures  . POCT glycosylated hemoglobin (Hb A1C)    Meds ordered this encounter  Medications  . metFORMIN (GLUCOPHAGE) 500 MG tablet    Sig: Take 1 tablet (500 mg total) by mouth daily with breakfast.    Dispense:  90 tablet    Refill:  0    Luiz Blare, DO 05/28/2015, 9:49 AM PGY-2, [place]

## 2015-05-28 NOTE — Assessment & Plan Note (Addendum)
Patient with well controlled T2DM. Last A1c was 7.1. Recheck today 5.3. Foot exam normal. Patient is compliant with medication. Only taking metformin 500mg  once daily. Will continue with this dose because blood sugars on monitor between 90-200. At 3 month follow-up if still well controlled can consider trial off medication. Encouraged continue weight loss and healthy lifestyle. Referral placed for opthalmology.

## 2015-05-28 NOTE — Patient Instructions (Signed)
Jason Rhodes it was great to see you today!  I am pleased to hear that things are going well for you.  Here are some of the things we discussed today: -A1c today was 5.3 -refilled metformin continue taking because you still have some sugars that are elevated per meter -if well controlled still in 3 months we can try a trial of just diet controlled -referral placed for eye doctor someone will contact you. -glad your sleep has improved  Please schedule a follow-up appointment for physical   Thanks for allowing me to be a part of your care! Dr. Gerarda Fraction

## 2015-06-09 ENCOUNTER — Ambulatory Visit (INDEPENDENT_AMBULATORY_CARE_PROVIDER_SITE_OTHER): Payer: Medicare Other | Admitting: Obstetrics and Gynecology

## 2015-06-09 ENCOUNTER — Encounter: Payer: Self-pay | Admitting: Obstetrics and Gynecology

## 2015-06-09 VITALS — BP 116/63 | HR 64 | Temp 98.4°F | Wt 209.5 lb

## 2015-06-09 DIAGNOSIS — Z23 Encounter for immunization: Secondary | ICD-10-CM | POA: Diagnosis not present

## 2015-06-09 DIAGNOSIS — Z20828 Contact with and (suspected) exposure to other viral communicable diseases: Secondary | ICD-10-CM | POA: Diagnosis not present

## 2015-06-09 DIAGNOSIS — Z0000 Encounter for general adult medical examination without abnormal findings: Secondary | ICD-10-CM | POA: Diagnosis not present

## 2015-06-09 DIAGNOSIS — Z114 Encounter for screening for human immunodeficiency virus [HIV]: Secondary | ICD-10-CM | POA: Diagnosis not present

## 2015-06-09 DIAGNOSIS — Z1159 Encounter for screening for other viral diseases: Secondary | ICD-10-CM | POA: Diagnosis not present

## 2015-06-09 MED ORDER — ZOSTER VACCINE LIVE 19400 UNT/0.65ML ~~LOC~~ SOLR: 0.6500 mL | Freq: Once | SUBCUTANEOUS | Status: DC

## 2015-06-09 NOTE — Patient Instructions (Signed)
Will get lab results to you when I receive them Follow-up for colonoscopy Shingles sent to your pharmacy  Health Maintenance, Male A healthy lifestyle and preventative care can promote health and wellness.  Maintain regular health, dental, and eye exams.  Eat a healthy diet. Foods like vegetables, fruits, whole grains, low-fat dairy products, and lean protein foods contain the nutrients you need and are low in calories. Decrease your intake of foods high in solid fats, added sugars, and salt. Get information about a proper diet from your health care provider, if necessary.  Regular physical exercise is one of the most important things you can do for your health. Most adults should get at least 150 minutes of moderate-intensity exercise (any activity that increases your heart rate and causes you to sweat) each week. In addition, most adults need muscle-strengthening exercises on 2 or more days a week.   Maintain a healthy weight. The body mass index (BMI) is a screening tool to identify possible weight problems. It provides an estimate of body fat based on height and weight. Your health care provider can find your BMI and can help you achieve or maintain a healthy weight. For males 20 years and older:  A BMI below 18.5 is considered underweight.  A BMI of 18.5 to 24.9 is normal.  A BMI of 25 to 29.9 is considered overweight.  A BMI of 30 and above is considered obese.  Maintain normal blood lipids and cholesterol by exercising and minimizing your intake of saturated fat. Eat a balanced diet with plenty of fruits and vegetables. Blood tests for lipids and cholesterol should begin at age 40 and be repeated every 5 years. If your lipid or cholesterol levels are high, you are over age 44, or you are at high risk for heart disease, you may need your cholesterol levels checked more frequently.Ongoing high lipid and cholesterol levels should be treated with medicines if diet and exercise are not  working.  If you smoke, find out from your health care provider how to quit. If you do not use tobacco, do not start.  Lung cancer screening is recommended for adults aged 86-80 years who are at high risk for developing lung cancer because of a history of smoking. A yearly low-dose CT scan of the lungs is recommended for people who have at least a 30-pack-year history of smoking and are current smokers or have quit within the past 15 years. A pack year of smoking is smoking an average of 1 pack of cigarettes a day for 1 year (for example, a 30-pack-year history of smoking could mean smoking 1 pack a day for 30 years or 2 packs a day for 15 years). Yearly screening should continue until the smoker has stopped smoking for at least 15 years. Yearly screening should be stopped for people who develop a health problem that would prevent them from having lung cancer treatment.  If you choose to drink alcohol, do not have more than 2 drinks per day. One drink is considered to be 12 oz (360 mL) of beer, 5 oz (150 mL) of wine, or 1.5 oz (45 mL) of liquor.  Avoid the use of street drugs. Do not share needles with anyone. Ask for help if you need support or instructions about stopping the use of drugs.  High blood pressure causes heart disease and increases the risk of stroke. High blood pressure is more likely to develop in:  People who have blood pressure in the end of the  normal range (100-139/85-89 mm Hg).  People who are overweight or obese.  People who are African American.  If you are 89-29 years of age, have your blood pressure checked every 3-5 years. If you are 53 years of age or older, have your blood pressure checked every year. You should have your blood pressure measured twice--once when you are at a hospital or clinic, and once when you are not at a hospital or clinic. Record the average of the two measurements. To check your blood pressure when you are not at a hospital or clinic, you can  use:  An automated blood pressure machine at a pharmacy.  A home blood pressure monitor.  If you are 66-48 years old, ask your health care provider if you should take aspirin to prevent heart disease.  Diabetes screening involves taking a blood sample to check your fasting blood sugar level. This should be done once every 3 years after age 38 if you are at a normal weight and without risk factors for diabetes. Testing should be considered at a younger age or be carried out more frequently if you are overweight and have at least 1 risk factor for diabetes.  Colorectal cancer can be detected and often prevented. Most routine colorectal cancer screening begins at the age of 20 and continues through age 72. However, your health care provider may recommend screening at an earlier age if you have risk factors for colon cancer. On a yearly basis, your health care provider may provide home test kits to check for hidden blood in the stool. A small camera at the end of a tube may be used to directly examine the colon (sigmoidoscopy or colonoscopy) to detect the earliest forms of colorectal cancer. Talk to your health care provider about this at age 17 when routine screening begins. A direct exam of the colon should be repeated every 5-10 years through age 54, unless early forms of precancerous polyps or small growths are found.  People who are at an increased risk for hepatitis B should be screened for this virus. You are considered at high risk for hepatitis B if:  You were born in a country where hepatitis B occurs often. Talk with your health care provider about which countries are considered high risk.  Your parents were born in a high-risk country and you have not received a shot to protect against hepatitis B (hepatitis B vaccine).  You have HIV or AIDS.  You use needles to inject street drugs.  You live with, or have sex with, someone who has hepatitis B.  You are a man who has sex with other  men (MSM).  You get hemodialysis treatment.  You take certain medicines for conditions like cancer, organ transplantation, and autoimmune conditions.  Hepatitis C blood testing is recommended for all people born from [birth year] through 1966 and any individual with known risk factors for hepatitis C.  Healthy men should no longer receive prostate-specific antigen (PSA) blood tests as part of routine cancer screening. Talk to your health care provider about prostate cancer screening.  Testicular cancer screening is not recommended for adolescents or adult males who have no symptoms. Screening includes self-exam, a health care provider exam, and other screening tests. Consult with your health care provider about any symptoms you have or any concerns you have about testicular cancer.  Practice safe sex. Use condoms and avoid high-risk sexual practices to reduce the spread of sexually transmitted infections (STIs).  You should be screened for  STIs, including gonorrhea and chlamydia if:  You are sexually active and are younger than 24 years.  You are older than 24 years, and your health care provider tells you that you are at risk for this type of infection.  Your sexual activity has changed since you were last screened, and you are at an increased risk for chlamydia or gonorrhea. Ask your health care provider if you are at risk.  If you are at risk of being infected with HIV, it is recommended that you take a prescription medicine daily to prevent HIV infection. This is called pre-exposure prophylaxis (PrEP). You are considered at risk if:  You are a man who has sex with other men (MSM).  You are a heterosexual man who is sexually active with multiple partners.  You take drugs by injection.  You are sexually active with a partner who has HIV.  Talk with your health care provider about whether you are at high risk of being infected with HIV. If you choose to begin PrEP, you should first be tested  for HIV. You should then be tested every 3 months for as long as you are taking PrEP.  Use sunscreen. Apply sunscreen liberally and repeatedly throughout the day. You should seek shade when your shadow is shorter than you. Protect yourself by wearing long sleeves, pants, a wide-brimmed hat, and sunglasses year round whenever you are outdoors.  Tell your health care provider of new moles or changes in moles, especially if there is a change in shape or color. Also, tell your health care provider if a mole is larger than the size of a pencil eraser.  A one-time screening for abdominal aortic aneurysm (AAA) and surgical repair of large AAAs by ultrasound is recommended for men aged 58-75 years who are current or former smokers.  Stay current with your vaccines (immunizations).   This information is not intended to replace advice given to you by your health care provider. Make sure you discuss any questions you have with your health care provider.   Document Released: 09/09/2007 Document Revised: 04/03/2014 Document Reviewed: 08/08/2010 Elsevier Interactive Patient Education [employer].

## 2015-06-09 NOTE — Progress Notes (Signed)
     Subjective: Chief Complaint  Patient presents with  . Annual Exam    HPI: Jason Rhodes is a 65 y.o. presenting to clinic today for annual exam/preventative visit.  Acute Concerns: None; states however her will be scheduling appointment soon for his CPAP follow-up as instructed by insurance company.   Diet: cutting down, eats a lot of bread, no sugar;has cut out a lot due to his diabetes  Exercise: walks every other day  Sexual History: Married but not currently sexually active  POA/Living Will: No advance directive - only daughter and wife as [name]  Social:  Social History   Social History  . Marital Status: Married    Spouse Name: N/A  . Number of Children: N/A  . Years of Education: N/A   Social History Main Topics  . Smoking status: Never Smoker   . Smokeless tobacco: None  . Alcohol Use: No  . Drug Use: None  . Sexual Activity: Not Asked   Other Topics Concern  . None   Social History Narrative    Immunization:  Tdap/TD: Up to date  Influenza: Up to date  Pneumococcal: Due  Herpes Zoster: Due  Cancer Screening:  Colonoscopy: Due in 2018; 5 years  ROS reviewed and were negative unless otherwise noted in HPI.  Past Medical, Surgical, Social, and Family History Reviewed & Updated per EMR.  Objective: BP 116/63 mmHg  Pulse 64  Temp(Src) 98.4 F (36.9 C) (Oral)  Wt 209 lb 8 oz (95.029 kg) Vitals and nursing notes reviewed  Physical Exam  Constitutional: He is oriented to person, place, and time and well-developed, well-nourished, and in no distress.  HENT:  Head: Normocephalic and atraumatic.  Mouth/Throat: Oropharynx is clear and moist.  Eyes: Conjunctivae and EOM are normal.  Neck: Normal range of motion. Neck supple.  Cardiovascular: Normal rate, regular rhythm, normal heart sounds and intact distal pulses.   Pulmonary/Chest: Effort normal and breath sounds normal.  Abdominal: Soft. Bowel sounds are normal. There is no tenderness.   Musculoskeletal: Normal range of motion. He exhibits no edema or tenderness.  Neurological: He is alert and oriented to person, place, and time.  Non-focal  Skin: Skin is warm and dry.  Psychiatric: Mood and affect normal.    Assessment/Plan: Please see problem based Assessment and Plan   Orders Placed This Encounter  Procedures  . Pneumococcal conjugate vaccine 13-valent IM  . Hepatitis C antibody  . HIV antibody    Meds ordered this encounter  Medications  . zoster vaccine live, PF, (ZOSTAVAX) 24401 UNT/0.65ML injection    Sig: Inject 19,400 Units into the skin once.    Dispense:  1 each    Refill:  [place], DO 06/09/2015, 3:22 PM PGY-2, [place]

## 2015-06-10 ENCOUNTER — Encounter: Payer: Self-pay | Admitting: Obstetrics and Gynecology

## 2015-06-10 LAB — HEPATITIS C ANTIBODY: HCV AB: NEGATIVE

## 2015-06-10 LAB — HIV ANTIBODY (ROUTINE TESTING W REFLEX): HIV 1&2 Ab, 4th Generation: NONREACTIVE

## 2015-06-14 NOTE — Assessment & Plan Note (Signed)
Preventative visit today. Doing well overall. Chronic disease being managed and stable. Blood work ordered to screen for HIV and HCV. Will receive pneumococcal 13 vaccine. Needs colonoscopy next year. Shingles vaccine sent to pharmacy.

## 2015-06-21 ENCOUNTER — Ambulatory Visit (INDEPENDENT_AMBULATORY_CARE_PROVIDER_SITE_OTHER): Payer: Medicare Other | Admitting: Obstetrics and Gynecology

## 2015-06-21 ENCOUNTER — Encounter: Payer: Self-pay | Admitting: Obstetrics and Gynecology

## 2015-06-21 VITALS — BP 121/76 | HR 60 | Temp 98.0°F | Ht 66.0 in | Wt 205.0 lb

## 2015-06-21 DIAGNOSIS — G4733 Obstructive sleep apnea (adult) (pediatric): Secondary | ICD-10-CM

## 2015-06-21 NOTE — Patient Instructions (Signed)
Sleep Apnea Sleep apnea is disorder that affects a person's sleep. A person with sleep apnea has abnormal pauses in their breathing when they sleep. It is hard for them to get a good sleep. This makes a person tired during the day. It also can lead to other physical problems. There are three types of sleep apnea. One type is when breathing stops for a short time because your airway is blocked (obstructive sleep apnea). Another type is when the brain sometimes fails to give the normal signal to breathe to the muscles that control your breathing (central sleep apnea). The third type is a combination of the other two types. HOME CARE  Do not sleep on your back. Try to sleep on your side.  Take all medicine as told by your doctor.  Avoid alcohol, calming medicines (sedatives), and depressant drugs.  Try to lose weight if you are overweight. Talk to your doctor about a healthy weight goal. Your doctor may have you use a device that helps to open your airway. It can help you get the air that you need. It is called a positive airway pressure (PAP) device. There are three types of PAP devices:  Continuous positive airway pressure (CPAP) device.  Nasal expiratory positive airway pressure (EPAP) device.  Bilevel positive airway pressure (BPAP) device. MAKE SURE YOU:  Understand these instructions.  Will watch your condition.  Will get help right away if you are not doing well or get worse.   This information is not intended to replace advice given to you by your health care provider. Make sure you discuss any questions you have with your health care provider.   Document Released: 12/21/2007 Document Revised: 04/03/2014 Document Reviewed: 07/15/2011 Elsevier Interactive Patient Education 2016 Else[employer] Inc[employer]   CPAP and BIPAP Information CPAP and BIPAP are methods of helping you breathe with the use of air pressure. CPAP stands for "continuous positive airway pressure." BIPAP stands for "bi-level  positive airway pressure." In both methods, air is blown into your air passages to help keep you breathing well. With CPAP, the amount of pressure stays the same while you breathe in and out. CPAP is most commonly used for obstructive sleep apnea. For obstructive sleep apnea, CPAP works by holding your airways open so that they do not collapse when your muscles relax during sleep. BIPAP is similar to CPAP except the amount of pressure is increased when you inhale. This helps you take larger breaths. Your health care provider will recommend whether CPAP or BIPAP would be more helpful for you.  WHY ARE CPAP AND BIPAP TREATMENTS USED? CPAP or BIPAP can be helpful if you have:   Sleep apnea.   Chronic obstructive pulmonary disease (COPD).   Diseases that weaken the muscles of the chest, including muscular dystrophy or neurological diseases such as amyotrophic lateral sclerosis (ALS).  Other problems that cause breathing to be weak, abnormal, or difficult.  HOW IS CPAP OR BIPAP ADMINISTERED? Both CPAP and BIPAP are provided by a small machine with a flexible plastic tube that attaches to a plastic mask. The mask fits on your face, and air is blown into your air passages through your nose or mouth. The amount of pressure that is used to blow the air into your air passages can be set on the machine. Your health care provider will determine the pressure setting that should be used based on your individual needs. WHEN SHOULD CPAP OR BIPAP BE USED? In most cases, the mask is worn only  when sleeping. Generally, you will need to wear the mask throughout the night and during the daytime if you take a nap. In a few cases involving certain medical conditions, people also need to wear the mask at other times when they are awake. Follow your health care provider's instructions for when to use the machine.  USING THE MASK  Because the mask needs to be snug, some people feel a trapped or closed-in feeling  (claustrophobic) when first using the mask. You may need to get used to the mask gradually. To do this, you can first hold the mask loosely over your nose or mouth. Gradually apply the mask more snugly. You can also gradually increase the amount of time that you use the mask.  Masks are available in various types and sizes. Some fit over your mouth and nose, and some fit over just your nose. If your mask does not fit well, talk to your health care provider about getting a different one.  If you are using a nasal mask and you tend to breathe through your mouth, a chin strap may be applied to help keep your mouth closed.   The CPAP and BIPAP machines have alarms that may sound if the mask comes off or develops a leak.  If you have trouble with the mask, it is very important that you talk to your health care provider about finding a way to make the mask easier to tolerate. Do not stop using the mask. This could have a negative impact on your health. TIPS FOR USING THE MACHINE  Place your CPAP or BIPAP machine on a secure table or stand near an electrical outlet.   Know where the on-off switch is located on the machine.  Follow your health care provider's instructions for how to set the pressure on your machine and when you should use it.  Do not eat or drink while the CPAP or BIPAP machine is on. Food or fluids could get pushed into your lungs by the pressure of the CPAP or BIPAP.  Do not smoke. Tobacco smoke residue can damage the machine.   For home use, CPAP and BIPAP machines can be rented or purchased through home health care companies. Many different brands of machines are available. Renting a machine before purchasing may help you find out which particular machine works well for you. SEEK IMMEDIATE MEDICAL CARE IF:  You have redness or open areas around your nose or mouth where the mask fits.   You have trouble operating the CPAP or BIPAP machine.   You cannot tolerate wearing  the CPAP or BIPAP mask.    This information is not intended to replace advice given to you by your health care provider. Make sure you discuss any questions you have with your health care provider.   Document Released: 12/10/2003 Document Revised: 04/03/2014 Document Reviewed: 10/10/2012 Elsevier Interactive Patient Education [employer].

## 2015-06-21 NOTE — Progress Notes (Signed)
     Subjective: Chief Complaint  Patient presents with  . cpap     HPI: Jason Rhodes is a 65 y.o. presenting to clinic today to discuss the following:  #OSA/Sleep: -Patient now with Cpap machine for his severe OSA -states he is sleeping much better now with machine -denies any more daytime solmnelece or apneic episodes -wife states he does not snore anymore -he states he is sleeping well without any nighttime awakenings -uses every night  -Sleeps longer; more hrs -states pressure may need to be adjusted as it is really high  Symtpoms: Improved Witnessed apneas - no Snoring - no  Gasping/choking at night - no Excessive sleepiness not explained by other factors - well-rested in the morning Nonrefreshing sleep - no Total sleep amount - avg now 7-8hrs No daytime sleepiness Morning headaches - no Irritability - no Losing weight intentionally for health benefits  ROS noted in HPI.  Past Medical, Surgical, Social, and Family History Reviewed & Updated per EMR.   Objective: BP 121/76 mmHg  Pulse 60  Temp(Src) 98 F (36.7 C) (Oral)  Ht 5\' 6"  (1.676 m)  Wt 205 lb (92.987 kg)  BMI 33.10 kg/m2 Vitals and nursing notes reviewed  Physical Exam  Constitutional: He is well-developed, well-nourished, and in no distress.  Cardiovascular: Normal rate, regular rhythm and normal heart sounds.   Pulmonary/Chest: Effort normal and breath sounds normal. No respiratory distress. He has no wheezes.    Assessment/Plan: Please see problem based Assessment and [place], DO 06/21/2015, 3:36 PM PGY-2, [place]

## 2015-06-22 NOTE — Assessment & Plan Note (Signed)
Patient here for insurance related follow-up for CPAP. OSA symptoms much improved on CPAP. He is compliant with use based off report from his home care agency and per patient. Resolution of sleepiness. Patient and spousal satisfaction. Obtaining an adequate amount of sleep. Continued to encourage healthy lifestyle changes and weight loss to further improve symptoms. Discussed that pressure settings can be reduced and will follow-up with home health agency.

## 2015-06-23 DIAGNOSIS — H524 Presbyopia: Secondary | ICD-10-CM | POA: Diagnosis not present

## 2015-06-23 DIAGNOSIS — H25013 Cortical age-related cataract, bilateral: Secondary | ICD-10-CM | POA: Diagnosis not present

## 2015-06-23 DIAGNOSIS — H2513 Age-related nuclear cataract, bilateral: Secondary | ICD-10-CM | POA: Diagnosis not present

## 2015-06-23 DIAGNOSIS — E119 Type 2 diabetes mellitus without complications: Secondary | ICD-10-CM | POA: Diagnosis not present

## 2015-07-07 ENCOUNTER — Telehealth: Payer: Self-pay | Admitting: *Deleted

## 2015-07-07 MED ORDER — GLUCOSE BLOOD VI STRP: ORAL_STRIP | Status: DC

## 2015-07-07 NOTE — Telephone Encounter (Signed)
Received a fax from [facility] requesting a new Rx for test strips.  Every 6 months a new Rx is required per medicare.  DM standardized form completed for One Touch Ultra Blue test strips; #100, 5 refills.  Form signed by Dr. Gwendlyn Deutscher and faxed back to [facility].  Derl Barrow, RN

## 2015-07-14 ENCOUNTER — Other Ambulatory Visit: Payer: Self-pay | Admitting: *Deleted

## 2015-07-14 DIAGNOSIS — E785 Hyperlipidemia, unspecified: Secondary | ICD-10-CM

## 2015-07-14 DIAGNOSIS — I10 Essential (primary) hypertension: Secondary | ICD-10-CM

## 2015-07-15 ENCOUNTER — Other Ambulatory Visit: Payer: Self-pay | Admitting: Obstetrics and Gynecology

## 2015-08-16 ENCOUNTER — Other Ambulatory Visit: Payer: Self-pay | Admitting: Obstetrics and Gynecology

## 2015-10-14 ENCOUNTER — Other Ambulatory Visit: Payer: Self-pay | Admitting: Family Medicine

## 2015-11-05 ENCOUNTER — Other Ambulatory Visit: Payer: Self-pay | Admitting: Family Medicine

## 2015-11-11 ENCOUNTER — Encounter: Payer: Self-pay | Admitting: Family Medicine

## 2015-11-11 ENCOUNTER — Ambulatory Visit (INDEPENDENT_AMBULATORY_CARE_PROVIDER_SITE_OTHER): Payer: Medicare Other | Admitting: Family Medicine

## 2015-11-11 VITALS — BP 129/70 | HR 74 | Ht 66.0 in | Wt 206.0 lb

## 2015-11-11 DIAGNOSIS — M5412 Radiculopathy, cervical region: Secondary | ICD-10-CM | POA: Diagnosis not present

## 2015-11-11 DIAGNOSIS — E119 Type 2 diabetes mellitus without complications: Secondary | ICD-10-CM | POA: Diagnosis not present

## 2015-11-11 DIAGNOSIS — Z23 Encounter for immunization: Secondary | ICD-10-CM | POA: Diagnosis not present

## 2015-11-11 LAB — POCT GLYCOSYLATED HEMOGLOBIN (HGB A1C): Hemoglobin A1C: 5.2

## 2015-11-11 MED ORDER — NAPROXEN 500 MG PO TABS: ORAL_TABLET | ORAL | 0 refills | Status: DC

## 2015-11-11 NOTE — Progress Notes (Signed)
   Subjective:    Patient ID: Jason Rhodes is a 65 y.o. male presenting with Diabetes  on 11/11/2015  HPI: Checking BS tid. On Glucophage. Hgb A1C is 5.2. Dietary changes noted. Needs Naproxen for neck pain. Has reduced his Gabapentin to once daily, and occasionally may need 3x/day.  Review of Systems  Constitutional: Negative for chills and fever.  Respiratory: Negative for shortness of breath.   Cardiovascular: Negative for leg swelling.  Gastrointestinal: Negative for abdominal pain, nausea and vomiting.      Objective:    BP 129/70   Pulse 74   Ht 5\' 6"  (1.676 m)   Wt 206 lb (93.4 kg)   BMI 33.25 kg/m  Physical Exam  Constitutional: He appears well-developed and well-nourished. No distress.  HENT:  Head: Normocephalic and atraumatic.  Eyes: No scleral icterus.  Neck: Neck supple.  Cardiovascular: Normal rate.   Pulmonary/Chest: Effort normal.  Abdominal: Soft.  Musculoskeletal: He exhibits no edema.  Neurological: He is alert.  Skin: Skin is warm.  Psychiatric: He has a normal mood and affect.  Vitals reviewed.  HgbA1C is 5.2     Assessment & Plan:   Problem List Items Addressed This Visit      Unprioritized   Neuropathy, cervical (radicular)    Refilled meds. Continue Gabapentin and Naproxen.      Relevant Medications   naproxen (NAPROSYN) 500 MG tablet   New onset type 2 diabetes mellitus [facility]) - Primary    May stop Glucophage and continue diet--repeat HgbA1C to see if diet alone will work.       Relevant Orders   HgB A1c (Completed)    Other Visit Diagnoses   None.     Total face-to-face time with patient: 15 minutes. Over 50% of encounter was spent on counseling and coordination of care. Return in about 3 months (around 02/11/2016).  Ivelisse Culverhouse S 11/11/2015 3:22 PM

## 2015-11-11 NOTE — Patient Instructions (Signed)
DASH Eating Plan DASH stands for "Dietary Approaches to Stop Hypertension." The DASH eating plan is a healthy eating plan that has been shown to reduce high blood pressure (hypertension). Additional health benefits may include reducing the risk of type 2 diabetes mellitus, heart disease, and stroke. The DASH eating plan may also help with weight loss. WHAT DO I NEED TO KNOW ABOUT THE DASH EATING PLAN? For the DASH eating plan, you will follow these general guidelines:  Choose foods with a percent daily value for sodium of less than 5% (as listed on the food label).  Use salt-free seasonings or herbs instead of table salt or sea salt.  Check with your health care provider or pharmacist before using salt substitutes.  Eat lower-sodium products, often labeled as "lower sodium" or "no salt added."  Eat fresh foods.  Eat more vegetables, fruits, and low-fat dairy products.  Choose whole grains. Look for the word "whole" as the first word in the ingredient list.  Choose fish and skinless chicken or turkey more often than red meat. Limit fish, poultry, and meat to 6 oz (170 g) each day.  Limit sweets, desserts, sugars, and sugary drinks.  Choose heart-healthy fats.  Limit cheese to 1 oz (28 g) per day.  Eat more home-cooked food and less restaurant, buffet, and fast food.  Limit fried foods.  Cook foods using methods other than frying.  Limit canned vegetables. If you do use them, rinse them well to decrease the sodium.  When eating at a restaurant, ask that your food be prepared with less salt, or no salt if possible. WHAT FOODS CAN I EAT? Seek help from a dietitian for individual calorie needs. Grains Whole grain or whole wheat bread. Brown rice. Whole grain or whole wheat pasta. Quinoa, bulgur, and whole grain cereals. Low-sodium cereals. Corn or whole wheat flour tortillas. Whole grain cornbread. Whole grain crackers. Low-sodium crackers. Vegetables Fresh or frozen vegetables  (raw, steamed, roasted, or grilled). Low-sodium or reduced-sodium tomato and vegetable juices. Low-sodium or reduced-sodium tomato sauce and paste. Low-sodium or reduced-sodium canned vegetables.  Fruits All fresh, canned (in natural juice), or frozen fruits. Meat and Other Protein Products Ground beef (85% or leaner), grass-fed beef, or beef trimmed of fat. Skinless chicken or turkey. Ground chicken or turkey. Pork trimmed of fat. All fish and seafood. Eggs. Dried beans, peas, or lentils. Unsalted nuts and seeds. Unsalted canned beans. Dairy Low-fat dairy products, such as skim or 1% milk, 2% or reduced-fat cheeses, low-fat ricotta or cottage cheese, or plain low-fat yogurt. Low-sodium or reduced-sodium cheeses. Fats and Oils Tub margarines without trans fats. Light or reduced-fat mayonnaise and salad dressings (reduced sodium). Avocado. Safflower, olive, or canola oils. Natural peanut or almond butter. Other Unsalted popcorn and pretzels. The items listed above may not be a complete list of recommended foods or beverages. Contact your dietitian for more options. WHAT FOODS ARE NOT RECOMMENDED? Grains White bread. White pasta. White rice. Refined cornbread. Bagels and croissants. Crackers that contain trans fat. Vegetables Creamed or fried vegetables. Vegetables in a cheese sauce. Regular canned vegetables. Regular canned tomato sauce and paste. Regular tomato and vegetable juices. Fruits Dried fruits. Canned fruit in light or heavy syrup. Fruit juice. Meat and Other Protein Products Fatty cuts of meat. Ribs, chicken wings, bacon, sausage, bologna, salami, chitterlings, fatback, hot dogs, bratwurst, and packaged luncheon meats. Salted nuts and seeds. Canned beans with salt. Dairy Whole or 2% milk, cream, half-and-half, and cream cheese. Whole-fat or sweetened yogurt. Full-fat   cheeses or blue cheese. Nondairy creamers and whipped toppings. Processed cheese, cheese spreads, or cheese  curds. Condiments Onion and garlic salt, seasoned salt, table salt, and sea salt. Canned and packaged gravies. Worcestershire sauce. Tartar sauce. Barbecue sauce. Teriyaki sauce. Soy sauce, including reduced sodium. Steak sauce. Fish sauce. Oyster sauce. Cocktail sauce. Horseradish. Ketchup and mustard. Meat flavorings and tenderizers. Bouillon cubes. Hot sauce. Tabasco sauce. Marinades. Taco seasonings. Relishes. Fats and Oils Butter, stick margarine, lard, shortening, ghee, and bacon fat. Coconut, palm kernel, or palm oils. Regular salad dressings. Other Pickles and olives. Salted popcorn and pretzels. The items listed above may not be a complete list of foods and beverages to avoid. Contact your dietitian for more information. WHERE CAN I FIND MORE INFORMATION? National Heart, Lung, and [employer] Institute: travelstabloid.com   This information is not intended to replace advice given to you by your health care provider. Make sure you discuss any questions you have with your health care provider.   Document Released: 03/02/2011 Document Revised: 04/03/2014 Document Reviewed: 01/15/2013 Elsevier Interactive Patient Education 2016 [employer]. Diabetes and Exercise Exercising regularly is important. It is not just about losing weight. It has many health benefits, such as:  Improving your overall fitness, flexibility, and endurance.  Increasing your bone density.  Helping with weight control.  Decreasing your body fat.  Increasing your muscle strength.  Reducing stress and tension.  Improving your overall health. People with diabetes who exercise gain additional benefits because exercise:  Reduces appetite.  Improves the body's use of blood sugar (glucose).  Helps lower or control blood glucose.  Decreases blood pressure.  Helps control blood lipids (such as cholesterol and triglycerides).  Improves the body's use of the hormone insulin  by:  Increasing the body's insulin sensitivity.  Reducing the body's insulin needs.  Decreases the risk for heart disease because exercising:  Lowers cholesterol and triglycerides levels.  Increases the levels of good cholesterol (such as high-density lipoproteins [HDL]) in the body.  Lowers blood glucose levels. YOUR ACTIVITY PLAN  Choose an activity that you enjoy, and set realistic goals. To exercise safely, you should begin practicing any new physical activity slowly, and gradually increase the intensity of the exercise over time. Your health care provider or diabetes educator can help create an activity plan that works for you. General recommendations include:  Encouraging children to engage in at least 60 minutes of physical activity each day.  Stretching and performing strength training exercises, such as yoga or weight lifting, at least 2 times per week.  Performing a total of at least 150 minutes of moderate-intensity exercise each week, such as brisk walking or water aerobics.  Exercising at least 3 days per week, making sure you allow no more than 2 consecutive days to pass without exercising.  Avoiding long periods of inactivity (90 minutes or more). When you have to spend an extended period of time sitting down, take frequent breaks to walk or stretch. RECOMMENDATIONS FOR EXERCISING WITH TYPE 1 OR TYPE 2 DIABETES   Check your blood glucose before exercising. If blood glucose levels are greater than 240 mg/dL, check for urine ketones. Do not exercise if ketones are present.  Avoid injecting insulin into areas of the body that are going to be exercised. For example, avoid injecting insulin into:  The arms when playing tennis.  The legs when jogging.  Keep a record of:  Food intake before and after you exercise.  Expected peak times of insulin action.  Blood glucose  levels before and after you exercise.  The type and amount of exercise you have done.  Review  your records with your health care provider. Your health care provider will help you to develop guidelines for adjusting food intake and insulin amounts before and after exercising.  If you take insulin or oral hypoglycemic agents, watch for signs and symptoms of hypoglycemia. They include:  Dizziness.  Shaking.  Sweating.  Chills.  Confusion.  Drink plenty of water while you exercise to prevent dehydration or heat stroke. Body water is lost during exercise and must be replaced.  Talk to your health care provider before starting an exercise program to make sure it is safe for you. Remember, almost any type of activity is better than none.   This information is not intended to replace advice given to you by your health care provider. Make sure you discuss any questions you have with your health care provider.   Document Released: 06/03/2003 Document Revised: 07/28/2014 Document Reviewed: 08/20/2012 Elsevier Interactive Patient Education [employer].

## 2015-11-11 NOTE — Assessment & Plan Note (Signed)
Refilled meds. Continue Gabapentin and Naproxen.

## 2015-11-11 NOTE — Assessment & Plan Note (Signed)
May stop Glucophage and continue diet--repeat HgbA1C to see if diet alone will work.

## 2016-02-10 ENCOUNTER — Other Ambulatory Visit: Payer: Self-pay | Admitting: *Deleted

## 2016-02-10 NOTE — Telephone Encounter (Signed)
Received fax from [facility] requesting refill on OneTouch Delica Lancets.  Medicare requires a new Rx after 5 refills or 6 months.  Standardized DM form completed and signed by Dr. Ree Kida.  Form placed up front to be fax to [place].  Derl Barrow, RN

## 2016-02-24 NOTE — Telephone Encounter (Signed)
Pt's wife called and stated [facility] never received anything. Pt also needs test strips. Pt uses [facility] on [address]. Please advise. Thanks! ep

## 2016-02-25 MED ORDER — ONETOUCH DELICA LANCETS 33G MISC: 5 refills | Status: DC

## 2016-02-25 MED ORDER — GLUCOSE BLOOD VI STRP: ORAL_STRIP | 5 refills | Status: DC

## 2016-02-25 NOTE — Addendum Note (Signed)
Addended by: Derl Barrow on: 02/25/2016 09:06 AM   Modules accepted: Orders

## 2016-03-15 ENCOUNTER — Other Ambulatory Visit: Payer: Self-pay | Admitting: Obstetrics and Gynecology

## 2016-03-15 NOTE — Telephone Encounter (Signed)
Pt needs a refill on lisinopril-hydrochlorothiazide and amlodipine. Pt uses [facility] on [address]. Please advise. Thanks! ep

## 2016-03-16 MED ORDER — AMLODIPINE BESYLATE 10 MG PO TABS: 10.0000 mg | ORAL_TABLET | Freq: Every day | ORAL | 1 refills | Status: DC

## 2016-03-16 MED ORDER — LISINOPRIL-HYDROCHLOROTHIAZIDE 10-12.5 MG PO TABS: 1.0000 | ORAL_TABLET | Freq: Every day | ORAL | 1 refills | Status: DC

## 2016-04-10 ENCOUNTER — Telehealth: Payer: Self-pay | Admitting: Obstetrics and Gynecology

## 2016-04-10 NOTE — Telephone Encounter (Signed)
Pt needs a referral for a urologist. ep

## 2016-04-11 ENCOUNTER — Other Ambulatory Visit: Payer: Self-pay | Admitting: Obstetrics and Gynecology

## 2016-04-11 DIAGNOSIS — N400 Enlarged prostate without lower urinary tract symptoms: Secondary | ICD-10-CM

## 2016-04-11 MED ORDER — AMLODIPINE BESYLATE 10 MG PO TABS: 10.0000 mg | ORAL_TABLET | Freq: Every day | ORAL | 2 refills | Status: DC

## 2016-04-11 NOTE — Telephone Encounter (Signed)
Referral placed.

## 2016-04-11 NOTE — Telephone Encounter (Signed)
Medications refilled and sent to pharmacy.

## 2016-04-11 NOTE — Telephone Encounter (Signed)
Needs reflll amlodipine, atorvastatin.  [facility]

## 2016-04-26 DIAGNOSIS — N400 Enlarged prostate without lower urinary tract symptoms: Secondary | ICD-10-CM | POA: Diagnosis not present

## 2016-04-26 DIAGNOSIS — R972 Elevated prostate specific antigen [PSA]: Secondary | ICD-10-CM | POA: Diagnosis not present

## 2016-04-26 DIAGNOSIS — N5201 Erectile dysfunction due to arterial insufficiency: Secondary | ICD-10-CM | POA: Diagnosis not present

## 2016-04-26 DIAGNOSIS — R102 Pelvic and perineal pain: Secondary | ICD-10-CM | POA: Diagnosis not present

## 2016-05-02 DIAGNOSIS — R102 Pelvic and perineal pain: Secondary | ICD-10-CM | POA: Diagnosis not present

## 2016-05-02 DIAGNOSIS — R278 Other lack of coordination: Secondary | ICD-10-CM | POA: Diagnosis not present

## 2016-05-02 DIAGNOSIS — N50812 Left testicular pain: Secondary | ICD-10-CM | POA: Diagnosis not present

## 2016-05-02 DIAGNOSIS — M6281 Muscle weakness (generalized): Secondary | ICD-10-CM | POA: Diagnosis not present

## 2016-05-02 DIAGNOSIS — N50811 Right testicular pain: Secondary | ICD-10-CM | POA: Diagnosis not present

## 2016-05-02 DIAGNOSIS — M62838 Other muscle spasm: Secondary | ICD-10-CM | POA: Diagnosis not present

## 2016-05-09 DIAGNOSIS — M6281 Muscle weakness (generalized): Secondary | ICD-10-CM | POA: Diagnosis not present

## 2016-05-09 DIAGNOSIS — M62838 Other muscle spasm: Secondary | ICD-10-CM | POA: Diagnosis not present

## 2016-05-09 DIAGNOSIS — N50811 Right testicular pain: Secondary | ICD-10-CM | POA: Diagnosis not present

## 2016-05-09 DIAGNOSIS — R102 Pelvic and perineal pain: Secondary | ICD-10-CM | POA: Diagnosis not present

## 2016-05-09 DIAGNOSIS — N50812 Left testicular pain: Secondary | ICD-10-CM | POA: Diagnosis not present

## 2016-05-17 ENCOUNTER — Telehealth: Payer: Self-pay | Admitting: Obstetrics and Gynecology

## 2016-05-17 DIAGNOSIS — N50811 Right testicular pain: Secondary | ICD-10-CM | POA: Diagnosis not present

## 2016-05-17 DIAGNOSIS — M62838 Other muscle spasm: Secondary | ICD-10-CM | POA: Diagnosis not present

## 2016-05-17 DIAGNOSIS — N50812 Left testicular pain: Secondary | ICD-10-CM | POA: Diagnosis not present

## 2016-05-17 DIAGNOSIS — M6281 Muscle weakness (generalized): Secondary | ICD-10-CM | POA: Diagnosis not present

## 2016-05-17 DIAGNOSIS — R102 Pelvic and perineal pain: Secondary | ICD-10-CM | POA: Diagnosis not present

## 2016-05-17 NOTE — Telephone Encounter (Signed)
Scheduled AWV 05/23/16 at 1:30pm -Jason Rhodes

## 2016-05-19 ENCOUNTER — Other Ambulatory Visit: Payer: Self-pay | Admitting: Obstetrics and Gynecology

## 2016-05-19 MED ORDER — GLUCOSE BLOOD VI STRP: ORAL_STRIP | 5 refills | Status: DC

## 2016-05-23 ENCOUNTER — Encounter: Payer: Self-pay | Admitting: *Deleted

## 2016-05-23 ENCOUNTER — Ambulatory Visit (INDEPENDENT_AMBULATORY_CARE_PROVIDER_SITE_OTHER): Payer: Medicare Other | Admitting: *Deleted

## 2016-05-23 VITALS — BP 102/60 | HR 65 | Temp 98.8°F | Ht 66.0 in | Wt 216.0 lb

## 2016-05-23 DIAGNOSIS — E119 Type 2 diabetes mellitus without complications: Secondary | ICD-10-CM | POA: Diagnosis not present

## 2016-05-23 DIAGNOSIS — Z0000 Encounter for general adult medical examination without abnormal findings: Secondary | ICD-10-CM

## 2016-05-23 LAB — POCT GLYCOSYLATED HEMOGLOBIN (HGB A1C): Hemoglobin A1C: 6

## 2016-05-23 NOTE — Patient Instructions (Addendum)
Fall Prevention in the Home Falls can cause injuries. They can happen to people of all ages. There are many things you can do to make your home safe and to help prevent falls. What can I do on the outside of my home?  Regularly fix the edges of walkways and driveways and fix any cracks.  Remove anything that might make you trip as you walk through a door, such as a raised step or threshold.  Trim any bushes or trees on the path to your home.  Use bright outdoor lighting.  Clear any walking paths of anything that might make someone trip, such as rocks or tools.  Regularly check to see if handrails are loose or broken. Make sure that both sides of any steps have handrails.  Any raised decks and porches should have guardrails on the edges.  Have any leaves, snow, or ice cleared regularly.  Use sand or salt on walking paths during winter.  Clean up any spills in your garage right away. This includes oil or grease spills. What can I do in the bathroom?  Use night lights.  Install grab bars by the toilet and in the tub and shower. Do not use towel bars as grab bars.  Use non-skid mats or decals in the tub or shower.  If you need to sit down in the shower, use a plastic, non-slip stool.  Keep the floor dry. Clean up any water that spills on the floor as soon as it happens.  Remove soap buildup in the tub or shower regularly.  Attach bath mats securely with double-sided non-slip rug tape.  Do not have throw rugs and other things on the floor that can make you trip. What can I do in the bedroom?  Use night lights.  Make sure that you have a light by your bed that is easy to reach.  Do not use any sheets or blankets that are too big for your bed. They should not hang down onto the floor.  Have a firm chair that has side arms. You can use this for support while you get dressed.  Do not have throw rugs and other things on the floor that can make you trip. What can I do in  the kitchen?  Clean up any spills right away.  Avoid walking on wet floors.  Keep items that you use a lot in easy-to-reach places.  If you need to reach something above you, use a strong step stool that has a grab bar.  Keep electrical cords out of the way.  Do not use floor polish or wax that makes floors slippery. If you must use wax, use non-skid floor wax.  Do not have throw rugs and other things on the floor that can make you trip. What can I do with my stairs?  Do not leave any items on the stairs.  Make sure that there are handrails on both sides of the stairs and use them. Fix handrails that are broken or loose. Make sure that handrails are as long as the stairways.  Check any carpeting to make sure that it is firmly attached to the stairs. Fix any carpet that is loose or worn.  Avoid having throw rugs at the top or bottom of the stairs. If you do have throw rugs, attach them to the floor with carpet tape.  Make sure that you have a light switch at the top of the stairs and the bottom of the stairs. If you  do not have them, ask someone to add them for you. What else can I do to help prevent falls?  Wear shoes that:  Do not have high heels.  Have rubber bottoms.  Are comfortable and fit you well.  Are closed at the toe. Do not wear sandals.  If you use a stepladder:  Make sure that it is fully opened. Do not climb a closed stepladder.  Make sure that both sides of the stepladder are locked into place.  Ask someone to hold it for you, if possible.  Clearly mark and make sure that you can see:  Any grab bars or handrails.  First and last steps.  Where the edge of each step is.  Use tools that help you move around (mobility aids) if they are needed. These include:  Canes.  Walkers.  Scooters.  Crutches.  Turn on the lights when you go into a dark area. Replace any light bulbs as soon as they burn out.  Set up your furniture so you have a clear  path. Avoid moving your furniture around.  If any of your floors are uneven, fix them.  If there are any pets around you, be aware of where they are.  Review your medicines with your doctor. Some medicines can make you feel dizzy. This can increase your chance of falling. Ask your doctor what other things that you can do to help prevent falls. This information is not intended to replace advice given to you by your health care provider. Make sure you discuss any questions you have with your health care provider. Document Released: 01/07/2009 Document Revised: 08/19/2015 Document Reviewed: 04/17/2014 Elsevier Interactive Patient Education  2017 Young Maintenance, Male A healthy lifestyle and preventive care is important for your health and wellness. Ask your health care provider about what schedule of regular examinations is right for you. What should I know about weight and diet?  Eat a Healthy Diet  Eat plenty of vegetables, fruits, whole grains, low-fat dairy products, and lean protein.  Do not eat a lot of foods high in solid fats, added sugars, or salt. Maintain a Healthy Weight  Regular exercise can help you achieve or maintain a healthy weight. You should:  Do at least 150 minutes of exercise each week. The exercise should increase your heart rate and make you sweat (moderate-intensity exercise).  Do strength-training exercises at least twice a week. Watch Your Levels of Cholesterol and Blood Lipids  Have your blood tested for lipids and cholesterol every 5 years starting at 67 years of age. If you are at high risk for heart disease, you should start having your blood tested when you are 67 years old. You may need to have your cholesterol levels checked more often if:  Your lipid or cholesterol levels are high.  You are older than 67 years of age.  You are at high risk for heart disease. What should I know about cancer screening? Many types of cancers can  be detected early and may often be prevented. Lung Cancer  You should be screened every year for lung cancer if:  You are a current smoker who has smoked for at least 30 years.  You are a former smoker who has quit within the past 15 years.  Talk to your health care provider about your screening options, when you should start screening, and how often you should be screened. Colorectal Cancer  Routine colorectal cancer screening usually begins at 67 years  of age and should be repeated every 5-10 years until you are 67 years old. You may need to be screened more often if early forms of precancerous polyps or small growths are found. Your health care provider may recommend screening at an earlier age if you have risk factors for colon cancer.  Your health care provider may recommend using home test kits to check for hidden blood in the stool.  A small camera at the end of a tube can be used to examine your colon (sigmoidoscopy or colonoscopy). This checks for the earliest forms of colorectal cancer. Prostate and Testicular Cancer  Depending on your age and overall health, your health care provider may do certain tests to screen for prostate and testicular cancer.  Talk to your health care provider about any symptoms or concerns you have about testicular or prostate cancer. Skin Cancer  Check your skin from head to toe regularly.  Tell your health care provider about any new moles or changes in moles, especially if:  There is a change in a mole's size, shape, or color.  You have a mole that is larger than a pencil eraser.  Always use sunscreen. Apply sunscreen liberally and repeat throughout the day.  Protect yourself by wearing long sleeves, pants, a wide-brimmed hat, and sunglasses when outside. What should I know about heart disease, diabetes, and high blood pressure?  If you are 24-24 years of age, have your blood pressure checked every 3-5 years. If you are 63 years of age or  older, have your blood pressure checked every year. You should have your blood pressure measured twice-once when you are at a hospital or clinic, and once when you are not at a hospital or clinic. Record the average of the two measurements. To check your blood pressure when you are not at a hospital or clinic, you can use:  An automated blood pressure machine at a pharmacy.  A home blood pressure monitor.  Talk to your health care provider about your target blood pressure.  If you are between 76-79 years old, ask your health care provider if you should take aspirin to prevent heart disease.  Have regular diabetes screenings by checking your fasting blood sugar level.  If you are at a normal weight and have a low risk for diabetes, have this test once every three years after the age of 59.  If you are overweight and have a high risk for diabetes, consider being tested at a younger age or more often.  A one-time screening for abdominal aortic aneurysm (AAA) by ultrasound is recommended for men aged 54-75 years who are current or former smokers. What should I know about preventing infection? Hepatitis B  If you have a higher risk for hepatitis B, you should be screened for this virus. Talk with your health care provider to find out if you are at risk for hepatitis B infection. Hepatitis C  Blood testing is recommended for:  Everyone born from [birth year] through 1966.  Anyone with known risk factors for hepatitis C. Sexually Transmitted Diseases (STDs)  You should be screened each year for STDs including gonorrhea and chlamydia if:  You are sexually active and are younger than 67 years of age.  You are older than 67 years of age and your health care provider tells you that you are at risk for this type of infection.  Your sexual activity has changed since you were last screened and you are at an increased risk for chlamydia  or gonorrhea. Ask your health care provider if you are at  risk.  Talk with your health care provider about whether you are at high risk of being infected with HIV. Your health care provider may recommend a prescription medicine to help prevent HIV infection. What else can I do?  Schedule regular health, dental, and eye exams.  Stay current with your vaccines (immunizations).  Do not use any tobacco products, such as cigarettes, chewing tobacco, and e-cigarettes. If you need help quitting, ask your health care provider.  Limit alcohol intake to no more than 2 drinks per day. One drink equals 12 ounces of beer, 5 ounces of wine, or 1 ounces of hard liquor.  Do not use street drugs.  Do not share needles.  Ask your health care provider for help if you need support or information about quitting drugs.  Tell your health care provider if you often feel depressed.  Tell your health care provider if you have ever been abused or do not feel safe at home. This information is not intended to replace advice given to you by your health care provider. Make sure you discuss any questions you have with your health care provider. Document Released: 09/09/2007 Document Revised: 11/10/2015 Document Reviewed: 12/15/2014 Elsevier Interactive Patient Education  2017 Elsevier Inc.   Hearing Loss Hearing loss is a partial or total loss of the ability to hear. This can be temporary or permanent, and it can happen in one or both ears. Hearing loss may be referred to as deafness. Medical care is necessary to treat hearing loss properly and to prevent the condition from getting worse. Your hearing may partially or completely come back, depending on what caused your hearing loss and how severe it is. In some cases, hearing loss is permanent. What are the causes? Common causes of hearing loss include:  Too much wax in the ear canal.  Infection of the ear canal or middle ear.  Fluid in the middle ear.  Injury to the ear or surrounding area.  An object stuck in  the ear.  Prolonged exposure to loud sounds, such as music. Less common causes of hearing loss include:  Tumors in the ear.  Viral or bacterial infections, such as meningitis.  A hole in the eardrum (perforated eardrum).  Problems with the hearing nerve that sends signals between the brain and the ear.  Certain medicines. What are the signs or symptoms? Symptoms of this condition may include:  Difficulty telling the difference between sounds.  Difficulty following a conversation when there is background noise.  Lack of response to sounds in your environment. This may be most noticeable when you do not respond to startling sounds.  Needing to turn up the volume on the television, radio, etc.  Ringing in the ears.  Dizziness.  Pain in the ears. How is this diagnosed? This condition is diagnosed based on a physical exam and a hearing test (audiometry). The audiometry test will be performed by a hearing specialist (audiologist). You may also be referred to an ear, nose, and throat (ENT) specialist (otolaryngologist). How is this treated? Treatment for recent onset of hearing loss may include:  Ear wax removal.  Being prescribed medicines to prevent infection (antibiotics).  Being prescribed medicines to reduce inflammation (corticosteroids). Follow these instructions at home:  If you were prescribed an antibiotic medicine, take it as told by your health care provider. Do not stop taking the antibiotic even if you start to feel better.  Take over-the-counter   and prescription medicines only as told by your health care provider.  Avoid loud noises.  Return to your normal activities as told by your health care provider. Ask your health care provider what activities are safe for you.  Keep all follow-up visits as told by your health care provider. This is important. Contact a health care provider if:  You feel dizzy.  You develop new symptoms.  You vomit or feel  nauseous.  You have a fever. Get help right away if:  You develop sudden changes in your vision.  You have severe ear pain.  You have new or increased weakness.  You have a severe headache. This information is not intended to replace advice given to you by your health care provider. Make sure you discuss any questions you have with your health care provider. Document Released: 03/13/2005 Document Revised: 08/19/2015 Document Reviewed: 07/29/2014 Elsevier Interactive Patient Education  2017 Elsevier Inc.  

## 2016-05-23 NOTE — Progress Notes (Signed)
Subjective:   Jason Rhodes is a 66 y.o. male who presents for an Initial Medicare Annual Wellness Visit.  Cardiac Risk Factors include: advanced age (>68mn, >>63women);diabetes mellitus;dyslipidemia;hypertension;male gender;obesity (BMI >30kg/m2)    Objective:    Today's Vitals   05/23/16 1337  BP: 102/60  Pulse: 65  Temp: 98.8 F (37.1 C)  TempSrc: Oral  SpO2: 98%  Weight: 216 lb (98 kg)  Height: _0  (1.676 m)  PainSc: 0-No pain   Body mass index is 34.86 kg/m.  Current Medications (verified) Outpatient Encounter Prescriptions as of 05/23/2016  Medication Sig  . amLODipine (NORVASC) 10 MG tablet Take 1 tablet (10 mg total) by mouth daily.  .Marland Kitchenatorvastatin (LIPITOR) 40 MG tablet TAKE ONE TABLET BY MOUTH ONCE DAILY  . Blood Glucose Monitoring Suppl (ONE TOUCH ULTRA 2) W/DEVICE KIT Check sugars at least 3 times daily or as directed by provider  . gabapentin (NEURONTIN) 100 MG capsule TAKE ONE CAPSULE BY MOUTH THREE TIMES DAILY  . glucose blood test strip Use One test strip to check glucose level once daily.  ICD-10 code: E11.9.  Use One Touch Ultra Blue Test Strips.  . Lancets (ONETOUCH ULTRASOFT) lancets Use as instructed  . lisinopril-hydrochlorothiazide (PRINZIDE,ZESTORETIC) 10-12.5 MG tablet Take 1 tablet by mouth daily.  . Multiple Vitamin (MULTIVITAMIN WITH MINERALS) TABS tablet Take 1 tablet by mouth daily.  . naproxen (NAPROSYN) 500 MG tablet TAKE 1 TABLET BY MOUTH 2 TIMES DAILY AS NEEDED.  .Marland KitchenONETOUCH DELICA LANCETS 361YMISC Use to test blood glucose 3 times daily. ICD-10 code: E11.9.  . metFORMIN (GLUCOPHAGE) 500 MG tablet TAKE ONE TABLET BY MOUTH ONCE DAILY WITH BREAKFAST (Patient not taking: Reported on 05/23/2016)   No facility-administered encounter medications on file as of 05/23/2016.     Allergies (verified) Patient has no known allergies.   History: Past Medical History:  Diagnosis Date  . [place]   .  Hyperlipidemia   . Hypertension    Past Surgical History:  Procedure Laterality Date  . HERNIA REPAIR    . PROSTATE SURGERY    . SHOULDER SURGERY     Family History  Problem Relation Age of Onset  . Diabetes Mother   . Hypertension Mother   . Diabetes Father   . Hypertension Father   . Diabetes Brother   . Diabetes Brother    Social History   Occupational History  . Not on file.   Social History Main Topics  . Smoking status: Former Smoker    Packs/day: 1.00    Years: 5.00    Types: Cigarettes    Quit date: 131 . Smokeless tobacco: Never Used  . Alcohol use No  . Drug use: No  . Sexual activity: Yes   Tobacco Counseling Counseling given: Yes Patient is former smoker with no plans to restart  Activities of Daily Living In your present state of health, do you have any difficulty performing the following activities: 05/23/2016  Hearing? N  Vision? N  Difficulty concentrating or making decisions? N  Walking or climbing stairs? N  Dressing or bathing? N  Doing errands, shopping? N  Preparing Food and eating ? N  Using the Toilet? N  In the past six months, have you accidently leaked urine? N  Do you have problems with loss of bowel control? N  Managing your Medications? N  Managing your Finances? N  Housekeeping or managing your Housekeeping? N  Some recent data might  be hidden   Home Safety:  My home has a working smoke alarm:  Yes X 1           My home throw rugs have been fastened down to the floor or removed:  Removed I have non-slip mats in the bathtub and shower:  Non-Slip surface         All my home's stairs have railings or bannisters: One level home with 5 outside stairs with railings           My home's floors, stairs and hallways are free from clutter, wires and cords:  Yes     I wear seatbelts consistently:  Yes   Immunizations and Health Maintenance Immunization History  Administered Date(s) Administered  . Influenza,inj,Quad PF,36+ Mos  01/08/2013, 02/13/2014, 12/07/2014  . Pneumococcal Conjugate-13 06/09/2015  . Tdap 01/08/2013  . Zoster 06/18/2015   Health Maintenance Due  Topic Date Due  . COLONOSCOPY  11/30/1968  . INFLUENZA VACCINE  10/26/2015  . HEMOGLOBIN A1C  05/13/2016  Hgb A1C drawn today = 6.0 Flu vaccine administered 12/16/2015 at [place] Patient will schedule colonoscopy at [facility]  Patient Care Team: Katheren Shams, DO as PCP - General (Family Medicine) Bethann Goo, MD as Physician Assistant (Urology)  Indicate any recent Medical Services you may have received from other than [facility] providers in the past year (date may be approximate).    Assessment:   This is a routine wellness examination for Jason Rhodes.   Hearing/Vision screen  Hearing Screening   Method: Audiometry   _0  _1  _2  _3  _4  _5  _6  _7  _8   Right ear:   Fail 40 Fail  Fail    Left ear:   Fail Fail Fail  Fail    Patient denies hearing difficulties. No cerumen noted in either ear. Will discuss with PCP.  Dietary issues and exercise activities discussed: Current Exercise Habits: Home exercise routine, Type of exercise: walking;treadmill, Time (Minutes): 60, Frequency (Times/Week): 5, Weekly Exercise (Minutes/Week): 300, Intensity: Mild  Goals    . Exercise 150 minutes per week (moderate activity)          Patient exercising 300 minutes per week    . Increase water intake (pt-stated)      Depression Screen PHQ 2/9 Scores 05/23/2016 11/11/2015 06/21/2015 03/09/2015  PHQ - 2 Score 0 0 0 0    Fall Risk Fall Risk  05/23/2016 11/11/2015 03/09/2015 02/23/2015 01/20/2015  Falls in the past year? _9    TUG Test:  Done in 7 seconds.   Cognitive Function: Mini-Cog  Passed with score 3/5  Screening Tests Health Maintenance  Topic Date Due  . COLONOSCOPY  11/30/1968  . INFLUENZA VACCINE  10/26/2015  . HEMOGLOBIN A1C  05/13/2016  . FOOT EXAM  05/27/2016  . PNA vac Low Risk Adult (2 of 2 -  PPSV23) 06/08/2016  . OPHTHALMOLOGY EXAM  06/22/2016  . TETANUS/TDAP  01/09/2023  . Hepatitis C Screening  Completed        Plan:    Hgb A1C drawn today = 6.0 Flu vaccine administered 12/16/2015 at [place] Patient will schedule colonoscopy at [facility]  During the course of the visit Quirino was educated and counseled about the following appropriate screening and preventive services:   Vaccines to include Pneumoccal, Influenza, Td, Zostavax  Colorectal cancer screening  Cardiovascular disease screening  Diabetes screening  Glaucoma screening  Nutrition counseling   Patient Instructions (the written plan) were given to the patient.   DUCATTE, LAURENZE L,  RN   05/23/2016

## 2016-05-24 ENCOUNTER — Encounter: Payer: Self-pay | Admitting: *Deleted

## 2016-05-24 NOTE — Progress Notes (Signed)
Addendum: I have reviewed this visit and discussed with Howell Rucks, RN, BSN, and agree with her documentation.   Luiz Blare, DO 05/24/2016, 8:58 AM PGY-3, [place]

## 2016-08-17 ENCOUNTER — Telehealth: Payer: Self-pay | Admitting: Obstetrics and Gynecology

## 2016-08-17 DIAGNOSIS — Z1211 Encounter for screening for malignant neoplasm of colon: Secondary | ICD-10-CM

## 2016-08-17 NOTE — Telephone Encounter (Signed)
Wife is aware that referral has been placed and that they will be hearing from a GI office next for an appointment. Juandedios Dudash,CMA

## 2016-08-17 NOTE — Telephone Encounter (Signed)
Referral made please inform patient.

## 2016-08-17 NOTE — Telephone Encounter (Signed)
Wife states she has been calling for 2 months to get a referral for a colonoscopy for the pt, there are no prior notes. Wife would like to get this done as soon as possible. ep

## 2016-09-11 ENCOUNTER — Other Ambulatory Visit: Payer: Self-pay | Admitting: *Deleted

## 2016-09-13 ENCOUNTER — Other Ambulatory Visit: Payer: Self-pay | Admitting: *Deleted

## 2016-09-13 MED ORDER — GLUCOSE BLOOD VI STRP: ORAL_STRIP | 5 refills | Status: DC

## 2016-09-13 MED ORDER — ONETOUCH DELICA LANCETS 33G MISC: 5 refills | Status: DC

## 2016-09-14 ENCOUNTER — Other Ambulatory Visit: Payer: Self-pay | Admitting: *Deleted

## 2016-09-14 MED ORDER — GLUCOSE BLOOD VI STRP: ORAL_STRIP | 5 refills | Status: AC

## 2016-09-14 MED ORDER — ONETOUCH DELICA LANCETS 33G MISC: 5 refills | Status: AC

## 2016-09-21 DIAGNOSIS — Z86010 Personal history of colonic polyps: Secondary | ICD-10-CM | POA: Diagnosis not present

## 2016-09-21 DIAGNOSIS — K5730 Diverticulosis of large intestine without perforation or abscess without bleeding: Secondary | ICD-10-CM | POA: Diagnosis not present

## 2016-09-21 DIAGNOSIS — Z1211 Encounter for screening for malignant neoplasm of colon: Secondary | ICD-10-CM | POA: Diagnosis not present

## 2016-09-21 DIAGNOSIS — K648 Other hemorrhoids: Secondary | ICD-10-CM | POA: Diagnosis not present

## 2016-09-21 DIAGNOSIS — K635 Polyp of colon: Secondary | ICD-10-CM | POA: Diagnosis not present

## 2016-09-21 DIAGNOSIS — D122 Benign neoplasm of ascending colon: Secondary | ICD-10-CM | POA: Diagnosis not present

## 2016-09-22 ENCOUNTER — Other Ambulatory Visit: Payer: Self-pay | Admitting: Obstetrics and Gynecology

## 2016-09-22 DIAGNOSIS — K635 Polyp of colon: Secondary | ICD-10-CM | POA: Diagnosis not present

## 2016-09-22 DIAGNOSIS — D122 Benign neoplasm of ascending colon: Secondary | ICD-10-CM | POA: Diagnosis not present

## 2016-09-22 NOTE — Telephone Encounter (Signed)
Pt is calling for a refill on his Lisinopril. jw

## 2016-11-01 DIAGNOSIS — K648 Other hemorrhoids: Secondary | ICD-10-CM | POA: Diagnosis not present

## 2016-11-29 ENCOUNTER — Other Ambulatory Visit: Payer: Self-pay | Admitting: Family Medicine

## 2016-12-20 DIAGNOSIS — K648 Other hemorrhoids: Secondary | ICD-10-CM | POA: Diagnosis not present

## 2016-12-29 ENCOUNTER — Ambulatory Visit (INDEPENDENT_AMBULATORY_CARE_PROVIDER_SITE_OTHER): Payer: Medicare Other | Admitting: Family Medicine

## 2016-12-29 ENCOUNTER — Encounter: Payer: Self-pay | Admitting: Family Medicine

## 2016-12-29 ENCOUNTER — Ambulatory Visit (HOSPITAL_COMMUNITY)
Admission: RE | Admit: 2016-12-29 | Discharge: 2016-12-29 | Disposition: A | Discharge status: Home / Self Care | Payer: Medicare Other | Source: Ambulatory Visit | Attending: Family Medicine | Admitting: Family Medicine

## 2016-12-29 VITALS — BP 108/72 | HR 65 | Temp 97.9°F | Ht 66.0 in | Wt 209.2 lb

## 2016-12-29 DIAGNOSIS — M25461 Effusion, right knee: Secondary | ICD-10-CM | POA: Insufficient documentation

## 2016-12-29 DIAGNOSIS — M5412 Radiculopathy, cervical region: Secondary | ICD-10-CM | POA: Diagnosis not present

## 2016-12-29 DIAGNOSIS — I10 Essential (primary) hypertension: Secondary | ICD-10-CM

## 2016-12-29 DIAGNOSIS — Z23 Encounter for immunization: Secondary | ICD-10-CM | POA: Diagnosis not present

## 2016-12-29 DIAGNOSIS — G8929 Other chronic pain: Secondary | ICD-10-CM | POA: Diagnosis not present

## 2016-12-29 DIAGNOSIS — E119 Type 2 diabetes mellitus without complications: Secondary | ICD-10-CM | POA: Diagnosis not present

## 2016-12-29 DIAGNOSIS — M25561 Pain in right knee: Secondary | ICD-10-CM | POA: Insufficient documentation

## 2016-12-29 DIAGNOSIS — E785 Hyperlipidemia, unspecified: Secondary | ICD-10-CM | POA: Diagnosis not present

## 2016-12-29 LAB — POCT GLYCOSYLATED HEMOGLOBIN (HGB A1C): Hemoglobin A1C: 5.8

## 2016-12-29 IMAGING — DX DG KNEE COMPLETE 4+V*R*
4 series · 4 of 4 positions shown · non-contrast
Comparison: None.

CLINICAL DATA: Chronic right knee pain and swelling. No known
injury.

EXAM:
RIGHT KNEE - COMPLETE 4+ VIEW

[t knee ap right]
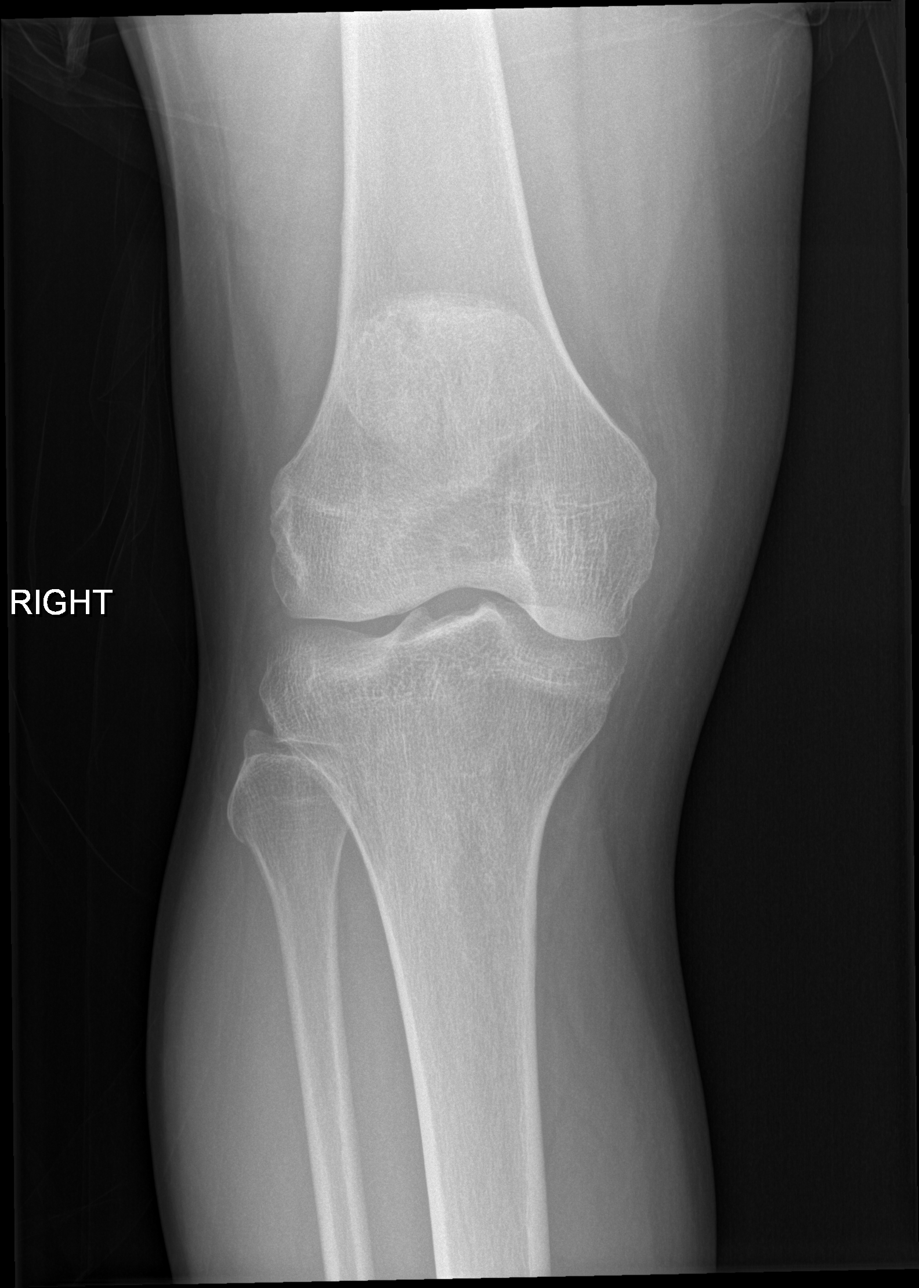

[t knee obl right (1 of 2)]
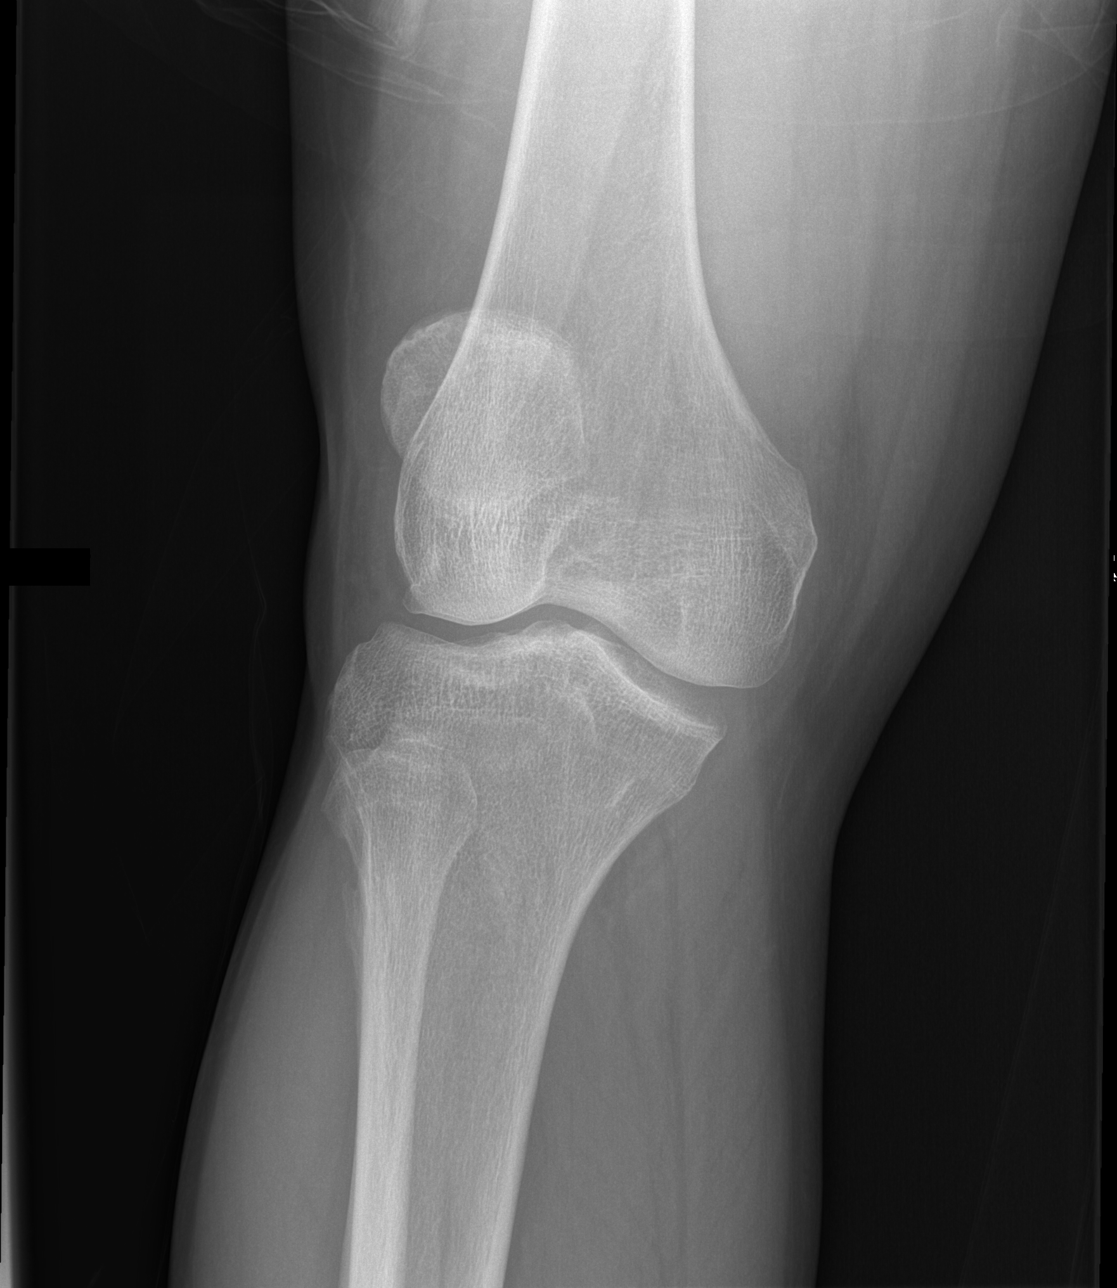

[t knee obl right (2 of 2)]
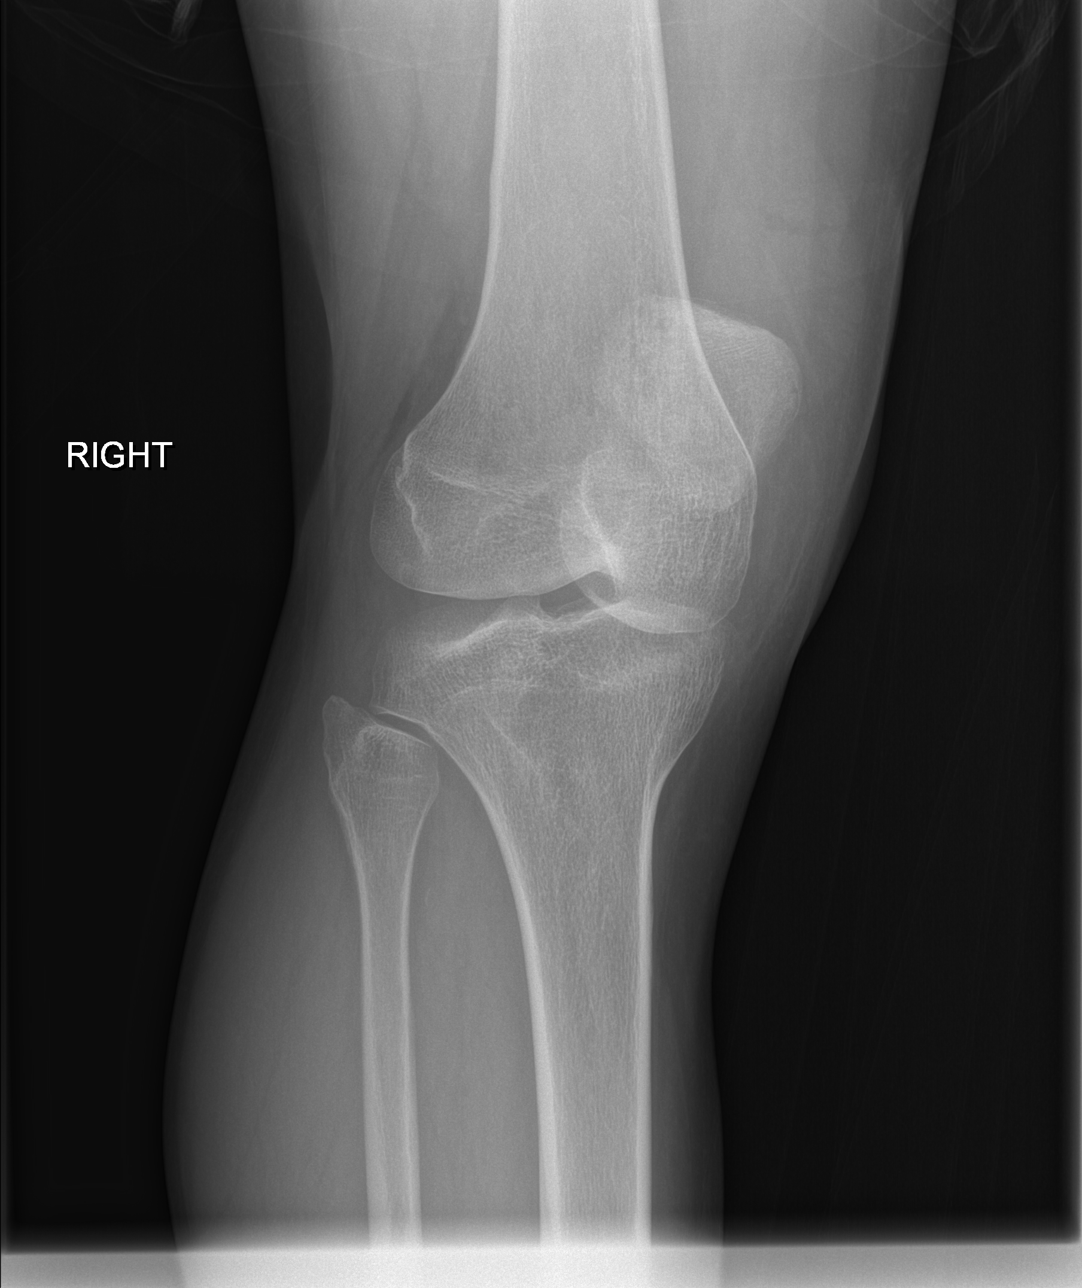

[t knee lat right]
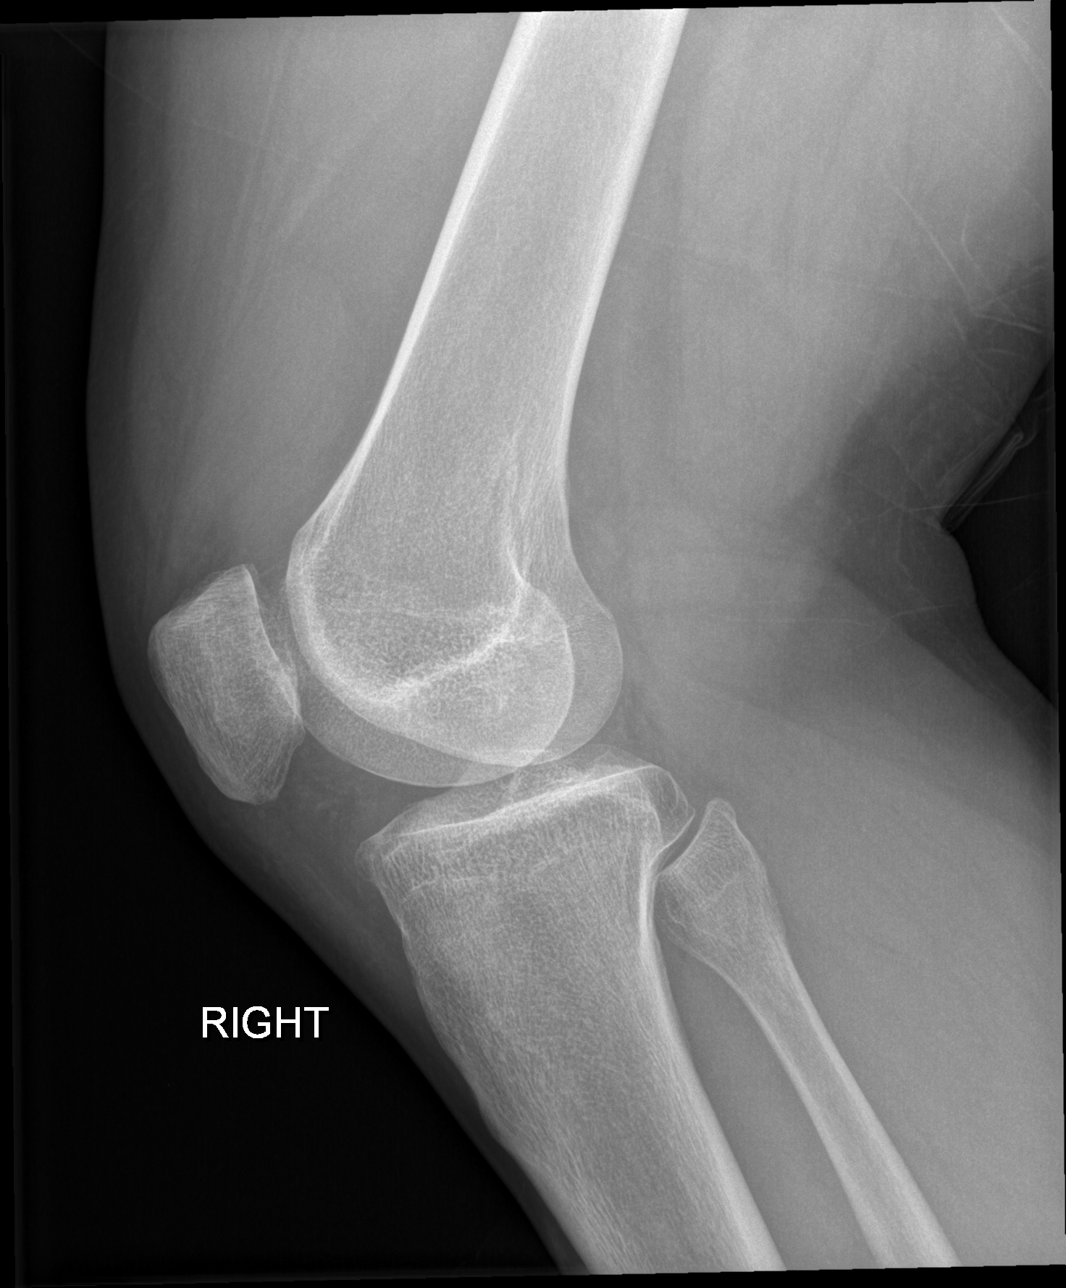

[4 of 4 positions shown; findings below may reference images not displayed]

FINDINGS: No fracture or bone lesion.

The knee joint is normally spaced and aligned. No significant
arthropathic change.

There is a moderate joint effusion distending the suprapatellar
joint capsule.

Soft tissues are unremarkable.
IMPRESSION: 1. No fracture, bone lesion or significant arthropathic change.
2. Moderate joint effusion.

## 2016-12-29 MED ORDER — AMLODIPINE BESYLATE 10 MG PO TABS: 10.0000 mg | ORAL_TABLET | Freq: Every day | ORAL | 2 refills | Status: DC

## 2016-12-29 MED ORDER — LISINOPRIL-HYDROCHLOROTHIAZIDE 10-12.5 MG PO TABS: 1.0000 | ORAL_TABLET | Freq: Every day | ORAL | 1 refills | Status: DC

## 2016-12-29 MED ORDER — ATORVASTATIN CALCIUM 40 MG PO TABS: 40.0000 mg | ORAL_TABLET | Freq: Every day | ORAL | 2 refills | Status: DC

## 2016-12-29 MED ORDER — NAPROXEN 375 MG PO TABS: 375.0000 mg | ORAL_TABLET | Freq: Two times a day (BID) | ORAL | 0 refills | Status: DC

## 2016-12-29 MED ORDER — GABAPENTIN 100 MG PO CAPS: 100.0000 mg | ORAL_CAPSULE | Freq: Three times a day (TID) | ORAL | 3 refills | Status: DC

## 2016-12-29 NOTE — Patient Instructions (Addendum)
It was great seeing you today! We have addressed the following issues today  1. I schedule you for a knee X ray, will follow up on the results at our next visit. For now you can use Naproxen for your pain as needed. 2. I refilled your medications. Your A1c for your diabetes is 5.8 improved from last time. 3. I will do some blood work to check kidney and liver function test. 4. I will see you in a few weeks to discuss results.  If we did any lab work today, and the results require attention, either me or my nurse will get in touch with you. If everything is normal, you will get a letter in mail and a message via . If you don't hear from [place] in two weeks, please give [place] a call. Otherwise, we look forward to seeing you again at your next visit. If you have any questions or concerns before then, please call the clinic at [phone].  Please bring all your medications to every doctors visit  Sign up for My Chart to have easy access to your labs results, and communication with your Primary care physician. Please ask Front Desk for some assistance.   Please check-out at the front desk before leaving the clinic.    Take Care,   Dr. Andy Gauss

## 2016-12-29 NOTE — Progress Notes (Signed)
Jason Rhodes is a 66 y.o. male   HPI:  Knee Pain: Patient presents with knee pain involving the  right knee. Onset of the symptoms was several weeks ago. Inciting event: this is a longstanding problem which has been getting worse. Current symptoms include pain located medial aspect of the knee. Pain is aggravated by going up and down stairs, squatting and walking.  Patient has had prior knee problems. Evaluation to date: none. Treatment to date: brace which is somewhat effective and OTC analgesics which are somewhat effective.  Past Medical History:  Diagnosis Date  . Diabetes mellitus without complication ([place])   . Hyperlipidemia   . Hypertension    Past Surgical History:  Procedure Laterality Date  . HERNIA REPAIR    . PROSTATE SURGERY    . SHOULDER SURGERY      Current Outpatient Prescriptions:  .  amLODipine (NORVASC) 10 MG tablet, Take 1 tablet (10 mg total) by mouth daily., Disp: 90 tablet, Rfl: 2 .  atorvastatin (LIPITOR) 40 MG tablet, TAKE ONE TABLET BY MOUTH ONCE DAILY, Disp: 90 tablet, Rfl: 2 .  Blood Glucose Monitoring Suppl (ONE TOUCH ULTRA 2) W/DEVICE KIT, Check sugars at least 3 times daily or as directed by provider, Disp: 1 each, Rfl: 0 .  gabapentin (NEURONTIN) 100 MG capsule, TAKE ONE CAPSULE BY MOUTH THREE TIMES DAILY, Disp: 90 capsule, Rfl: 3 .  glucose blood test strip, Use One test strip to check glucose level 3 times daily.  ICD-10 code: E11.9.  Use One Touch Ultra Blue Test Strips., Disp: 100 each, Rfl: 5 .  lisinopril-hydrochlorothiazide (PRINZIDE,ZESTORETIC) 10-12.5 MG tablet, TAKE ONE TABLET BY MOUTH ONCE DAILY, Disp: 90 tablet, Rfl: 1 .  metFORMIN (GLUCOPHAGE) 500 MG tablet, TAKE ONE TABLET BY MOUTH ONCE DAILY WITH BREAKFAST (Patient not taking: Reported on 05/23/2016), Disp: 90 tablet, Rfl: 1 .  Multiple Vitamin (MULTIVITAMIN WITH MINERALS) TABS tablet, Take 1 tablet by mouth daily., Disp: , Rfl:  .  naproxen (NAPROSYN) 500 MG tablet, TAKE 1 TABLET BY MOUTH 2 TIMES  DAILY AS NEEDED., Disp: 60 tablet, Rfl: 0 .  ONETOUCH DELICA LANCETS 25W MISC, Use to test blood glucose 3 times daily. ICD-10 code: E11.9., Disp: 100 each, Rfl: 5 No Known Allergies  reports that he quit smoking about 38 years ago. His smoking use included Cigarettes. He has a 5.00 pack-year smoking history. He has never used smokeless tobacco. He reports that he does not drink alcohol or use drugs. Family History  Problem Relation Age of Onset  . Diabetes Mother   . Hypertension Mother   . Diabetes Father   . Hypertension Father   . Diabetes Brother   . Diabetes Brother     Right Knee: Normal to inspection with no erythema or effusion or obvious bony abnormalities. Mildly tender to palpation in the medial compartment of the right knee with no warmth or joint line tenderness or patellar tenderness or condyle tenderness. ROM normal in flexion and extension and lower leg rotation. Ligaments with solid consistent endpoints including ACL, PCL, LCL, MCL. Negative Mcmurray's and provocative meniscal tests. Non painful patellar compression. Patellar and quadriceps tendons unremarkable. Hamstring and quadriceps strength is normal.  Assessment and plan: Patient presents complaining of right knee pain, worst with activity. Patient has a history of left knee pain back in 2015 manage with NSAIDs and brace with complete resolution of symptoms. Patient has been using a brace for additional support and OTC NSAIDs with some relief. Will like to further evaluate  knee with imaging given physical exam was only positive with some tenderness to palpation. Possible arthritic changes causing reported pain. --Order DG complete right knee --Recommend brace for support  --Refill Naproxen for pain to be used on prn basis --Will see the patient in a few weeks for reevaluation

## 2016-12-30 LAB — CBC
HEMATOCRIT: 37.5 % (ref 37.5–51.0)
HEMOGLOBIN: 12.5 g/dL — AB (ref 13.0–17.7)
MCH: 30.5 pg (ref 26.6–33.0)
MCHC: 33.3 g/dL (ref 31.5–35.7)
MCV: 92 fL (ref 79–97)
Platelets: 278 10*3/uL (ref 150–379)
RBC: 4.1 x10E6/uL — AB (ref 4.14–5.80)
RDW: 13.5 % (ref 12.3–15.4)
WBC: 5.3 10*3/uL (ref 3.4–10.8)

## 2016-12-30 LAB — CMP14+EGFR
A/G RATIO: 1.5 (ref 1.2–2.2)
ALK PHOS: 55 IU/L (ref 39–117)
ALT: 40 IU/L (ref 0–44)
AST: 28 IU/L (ref 0–40)
Albumin: 4.6 g/dL (ref 3.6–4.8)
BILIRUBIN TOTAL: 0.5 mg/dL (ref 0.0–1.2)
BUN/Creatinine Ratio: 13 (ref 10–24)
BUN: 14 mg/dL (ref 8–27)
CHLORIDE: 100 mmol/L (ref 96–106)
CO2: 25 mmol/L (ref 20–29)
Calcium: 9.5 mg/dL (ref 8.6–10.2)
Creatinine, Ser: 1.1 mg/dL (ref 0.76–1.27)
GFR calc Af Amer: 80 mL/min/{1.73_m2} (ref >59)
GFR calc non Af Amer: 69 mL/min/{1.73_m2} (ref >59)
GLUCOSE: 97 mg/dL (ref 65–99)
Globulin, Total: 3.1 g/dL (ref 1.5–4.5)
POTASSIUM: 4 mmol/L (ref 3.5–5.2)
Sodium: 141 mmol/L (ref 134–144)
TOTAL PROTEIN: 7.7 g/dL (ref 6.0–8.5)

## 2017-01-17 DIAGNOSIS — K648 Other hemorrhoids: Secondary | ICD-10-CM | POA: Diagnosis not present

## 2017-01-30 ENCOUNTER — Ambulatory Visit (INDEPENDENT_AMBULATORY_CARE_PROVIDER_SITE_OTHER): Payer: Medicare Other | Admitting: Family Medicine

## 2017-01-30 ENCOUNTER — Other Ambulatory Visit: Payer: Self-pay

## 2017-01-30 ENCOUNTER — Encounter: Payer: Self-pay | Admitting: Family Medicine

## 2017-01-30 DIAGNOSIS — G8929 Other chronic pain: Secondary | ICD-10-CM

## 2017-01-30 DIAGNOSIS — M25561 Pain in right knee: Secondary | ICD-10-CM | POA: Diagnosis present

## 2017-01-30 HISTORY — DX: Pain in right knee: M25.561

## 2017-01-30 NOTE — Progress Notes (Signed)
   Subjective:    Patient ID: Jason Rhodes, male    DOB: Dec 13, 1950, 66 y.o.   MRN: [id number]   CC: Right Knee Pain  HPI: Patient is 66 yo male who presents today to follow up on right knee pain. Patient reports that pain as improved with naproxen. Patient has also been using wife's knee brace which she reports provide additional support and decrease pain but has been too large for him. Left knee has not caused any issues for him. Patient wanted to review results from knee imaging and discuss lab results from CBC, BMP and A1c. Patient denies any swelling, difficulty with weight bearing.  Smoking status reviewed   ROS: all other systems were reviewed and are negative other than in the HPI   Past Medical History:  Diagnosis Date  . Diabetes mellitus without complication ([place])   . Hyperlipidemia   . Hypertension     Past Surgical History:  Procedure Laterality Date  . HERNIA REPAIR    . PROSTATE SURGERY    . SHOULDER SURGERY      Past medical history, surgical, family, and social history reviewed and updated in the EMR as appropriate.  Objective:  There were no vitals taken for this visit.  Vitals and nursing note reviewed  General: NAD, pleasant, able to participate in exam Cardiac: RRR, normal heart sounds, no murmurs. 2+ radial and PT pulses bilaterally Respiratory: CTAB, normal effort, No wheezes, rales or rhonchi Abdomen: soft, nontender, nondistended, no hepatic or splenomegaly, +BS Extremities: no edema or cyanosis. WWP. Good ROM on right knee, no crepitus, swelling or joint laxity noted on exam. Skin: warm and dry, no rashes noted Neuro: alert and oriented x4, no focal deficits Psych: Normal affect and mood   Assessment & Plan:   #Right knee pain, improving  Patient reports improvement in right hip pain with NSAIDs and brace. She has been using his wife's knee brace which is looser than expected. Knee x-ray ordered concerning for effusion, fracture, or significant  arthritic changes. Will continue with current treatment regimen with NSAIDs when necessary and order new knee brace for additional support. Talked to sports medicine who will provide patient with you a new knee brace today.  #Lab results Discuss results from CBC, CMP and A1c which were all within normal limits. No concerning findings. Will continue to monitor  Marjie Skiff, MD [place] PGY-2

## 2017-01-30 NOTE — Patient Instructions (Signed)
It was great seeing you today! We have addressed the following issues today  1) I am sending to Sports medicine for a knee brace. Continue with Naproxen as discussed. 2) The rest of your blood work is completely normal.  If we did any lab work today, and the results require attention, either me or my nurse will get in touch with you. If everything is normal, you will get a letter in mail and a message via . If you don't hear from [place] in two weeks, please give [place] a call. Otherwise, we look forward to seeing you again at your next visit. If you have any questions or concerns before then, please call the clinic at [phone].  Please bring all your medications to every doctors visit  Sign up for My Chart to have easy access to your labs results, and communication with your Primary care physician. Please ask Front Desk for some assistance.   Please check-out at the front desk before leaving the clinic.    Take Care,   Dr. Andy Gauss

## 2017-03-07 ENCOUNTER — Other Ambulatory Visit: Payer: Self-pay | Admitting: Family Medicine

## 2017-03-07 DIAGNOSIS — M25561 Pain in right knee: Principal | ICD-10-CM

## 2017-03-07 DIAGNOSIS — G8929 Other chronic pain: Secondary | ICD-10-CM

## 2017-04-12 ENCOUNTER — Other Ambulatory Visit: Payer: Self-pay | Admitting: Family Medicine

## 2017-07-24 ENCOUNTER — Ambulatory Visit (HOSPITAL_COMMUNITY)
Admission: RE | Admit: 2017-07-24 | Discharge: 2017-07-24 | Disposition: A | Discharge status: Home / Self Care | Payer: Medicare Other | Source: Ambulatory Visit | Attending: Family Medicine | Admitting: Family Medicine

## 2017-07-24 ENCOUNTER — Encounter: Payer: Self-pay | Admitting: Family Medicine

## 2017-07-24 ENCOUNTER — Other Ambulatory Visit: Payer: Self-pay

## 2017-07-24 ENCOUNTER — Ambulatory Visit (INDEPENDENT_AMBULATORY_CARE_PROVIDER_SITE_OTHER): Payer: Medicare Other | Admitting: Family Medicine

## 2017-07-24 VITALS — BP 122/80 | HR 67 | Temp 98.8°F | Ht 66.0 in | Wt 211.8 lb

## 2017-07-24 DIAGNOSIS — R093 Abnormal sputum: Secondary | ICD-10-CM

## 2017-07-24 DIAGNOSIS — R05 Cough: Secondary | ICD-10-CM | POA: Insufficient documentation

## 2017-07-24 DIAGNOSIS — J069 Acute upper respiratory infection, unspecified: Secondary | ICD-10-CM

## 2017-07-24 DIAGNOSIS — I517 Cardiomegaly: Secondary | ICD-10-CM | POA: Diagnosis not present

## 2017-07-24 DIAGNOSIS — B9789 Other viral agents as the cause of diseases classified elsewhere: Secondary | ICD-10-CM | POA: Diagnosis not present

## 2017-07-24 IMAGING — CR DG CHEST 2V
2 series · 2 of 2 positions shown · non-contrast
Comparison: 12/07/2014

CLINICAL DATA: Cough.

EXAM:
CHEST - 2 VIEW

[chest pa]
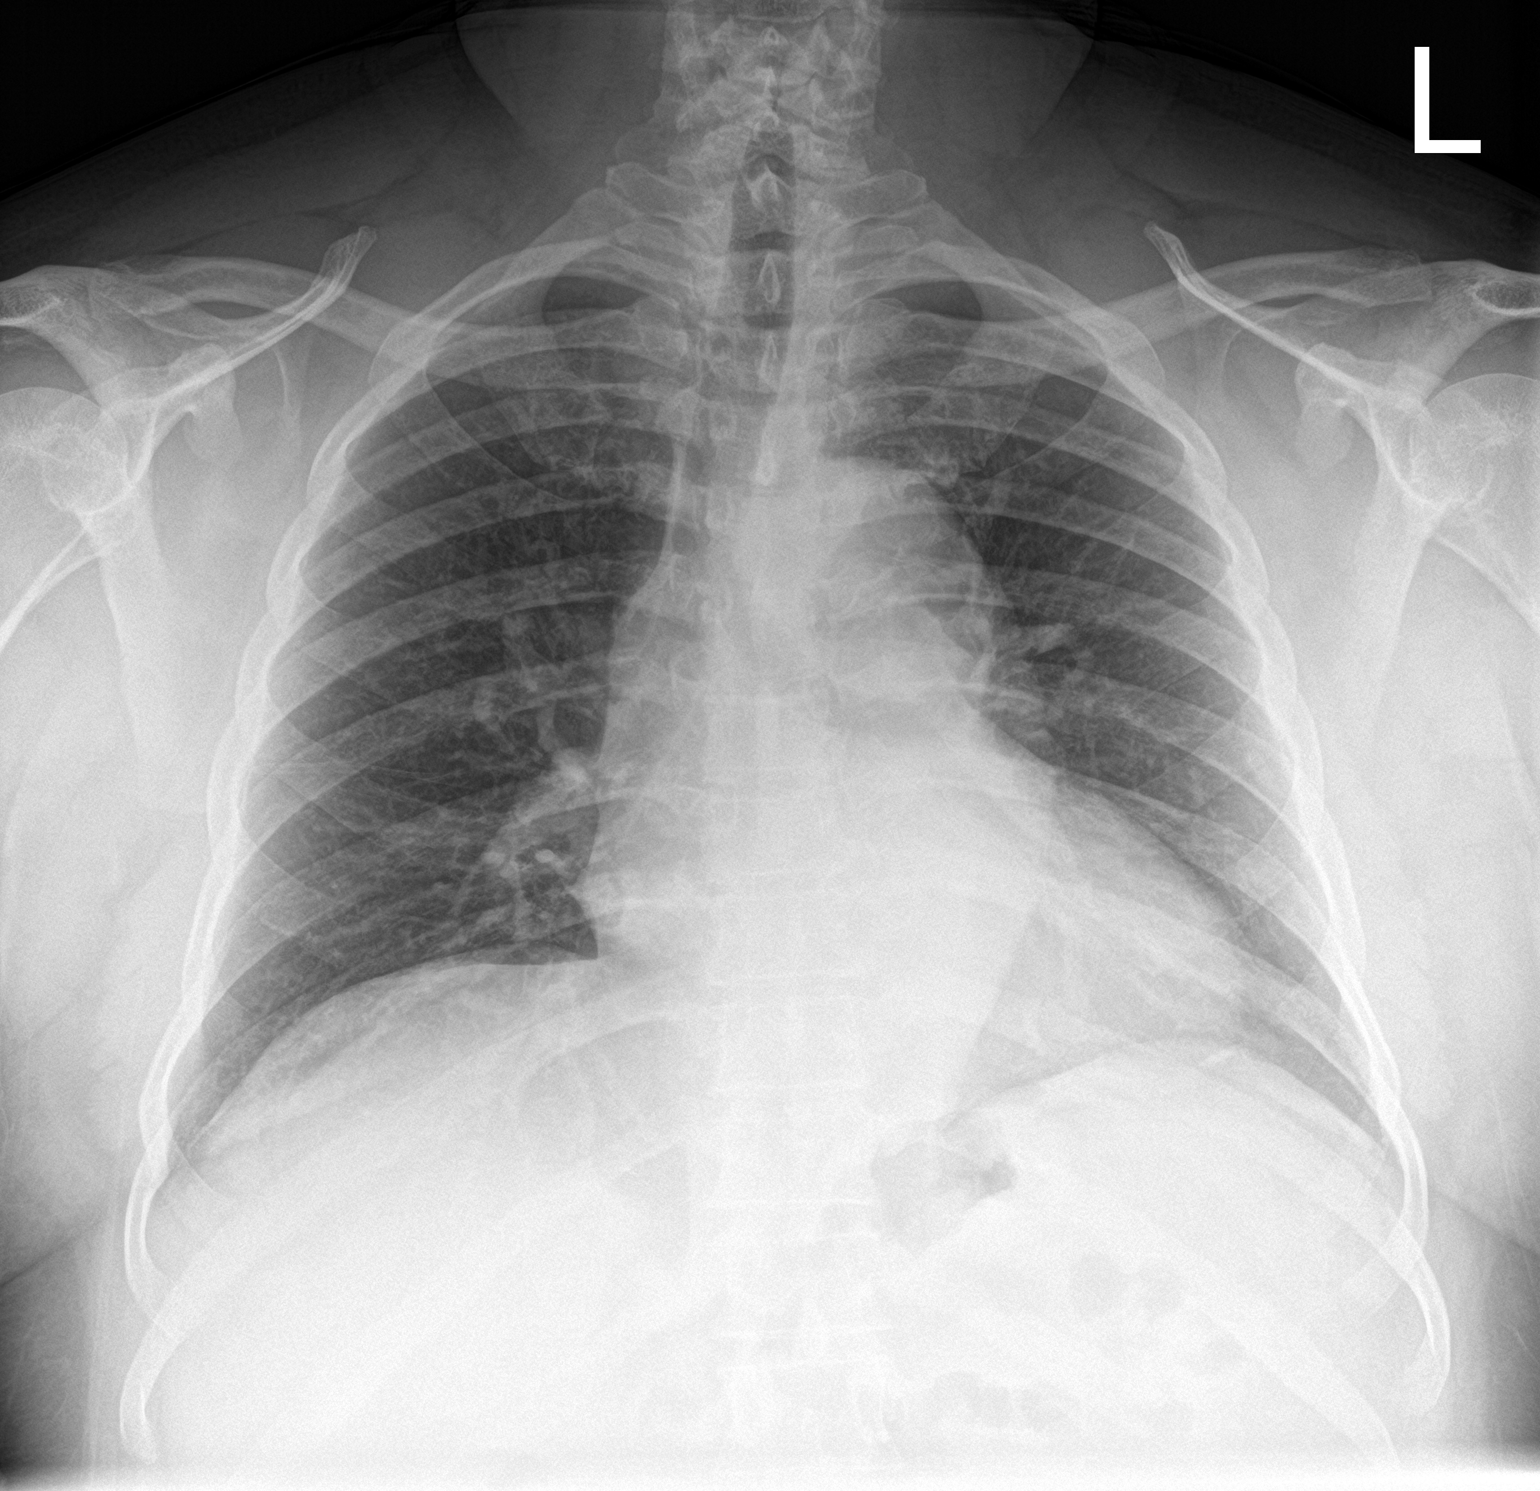

[chest lat]
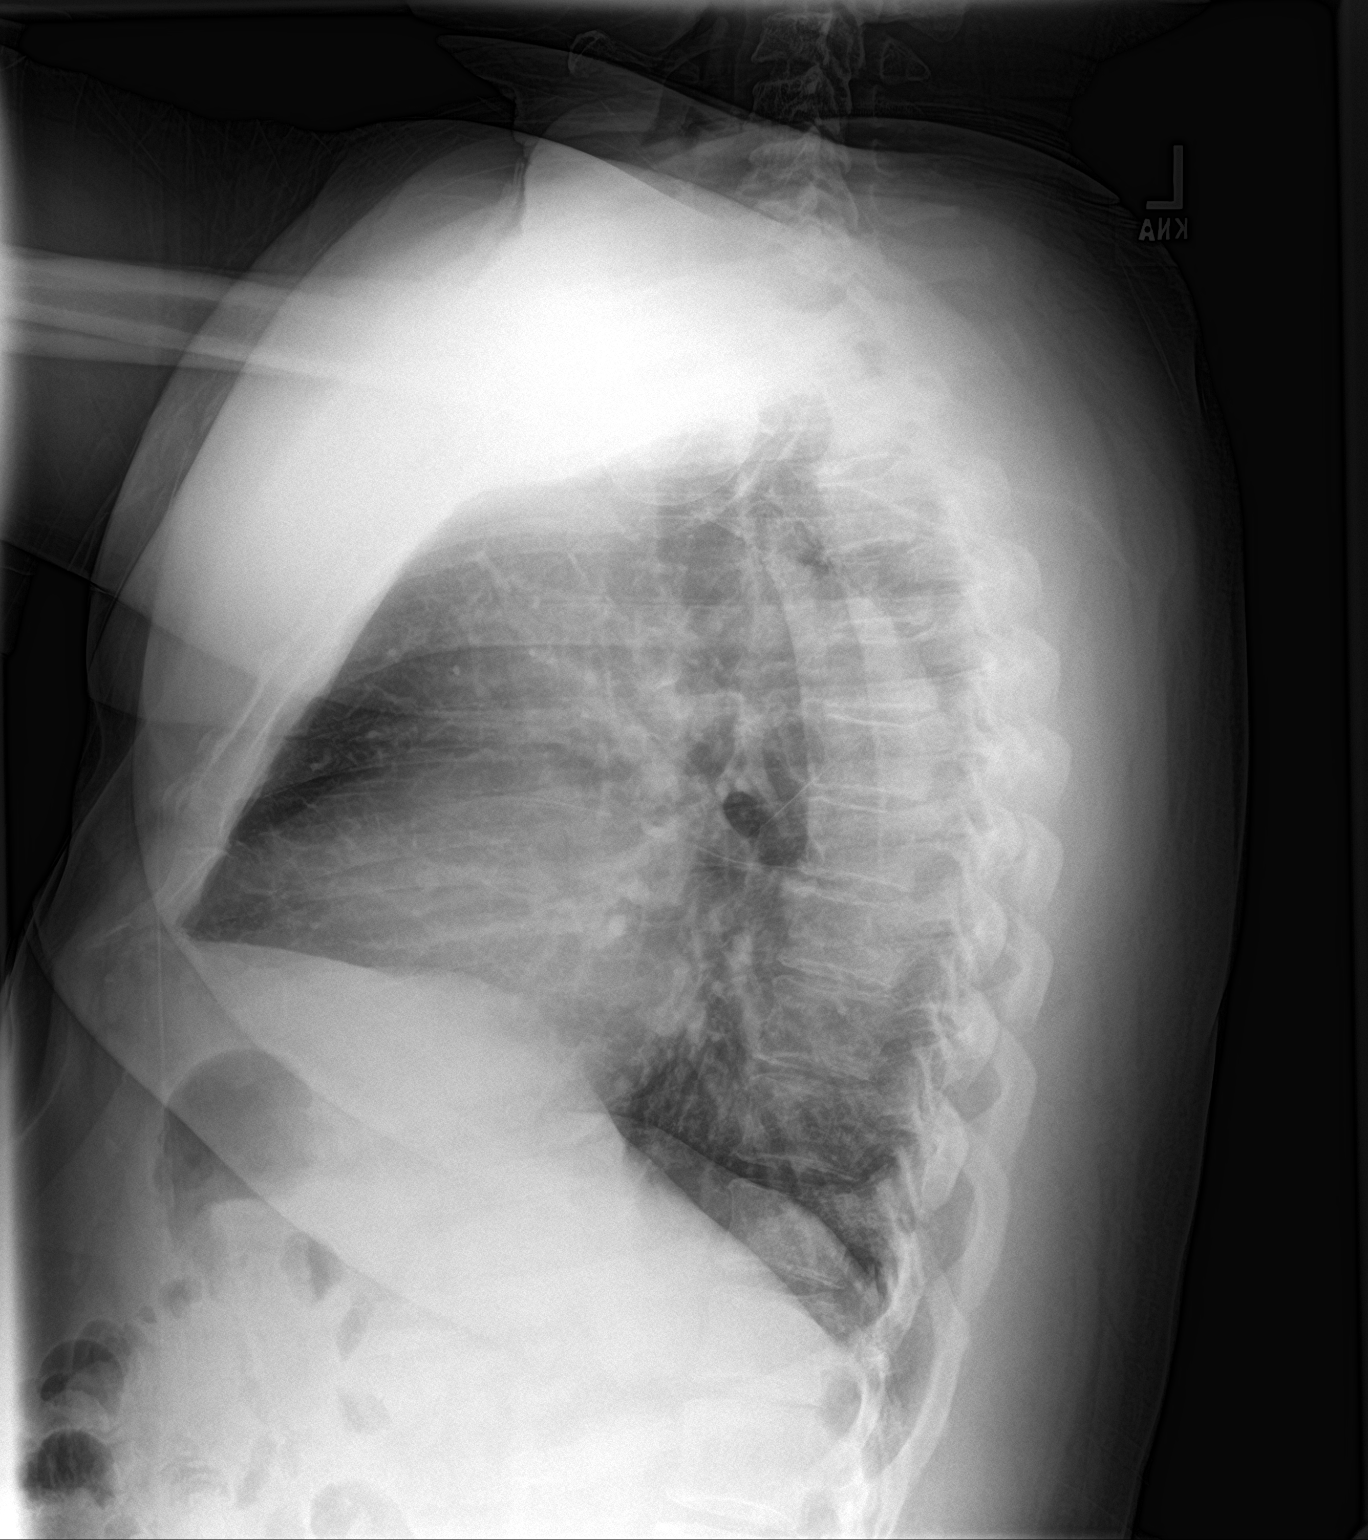

[2 of 2 positions shown; findings below may reference images not displayed]

FINDINGS: Heart is mildly enlarged. Lungs are clear. No effusions or edema. No
acute bony abnormality.
IMPRESSION: Mild cardiomegaly.  No active disease.

## 2017-07-24 NOTE — Progress Notes (Signed)
   Subjective:    Patient ID: Jason Rhodes , male   DOB: 1951-03-12 , 67 y.o..   MRN: [id number]  HPI  Ramond Darnell is a 67 yo M with PMH of T2DM, OSA, sickle cell trait, prior + TB test, HTN here for  Chief Complaint  Patient presents with  . Cough    with chest congestion    1.  Cough/congestion; patient notes that 2 weeks ago he had a runny nose with cough and nasal congestion.  He notes that he had subjective fevers at the time.  He is feeling much better now however and the only thing he is dealing with is a persistent cough.  Occasionally the cough will be productive of dark brown sputum.  He notes that this is not normal for him.  He has been using TheraFlu which has helped him with the cough.  Denies any night sweats.  Does have a history of a positive TB test but notes that he will always have this because he was born in [place].  Review of Systems: Per HPI.   Past Medical History: Patient Active Problem List   Diagnosis Date Noted  . Right knee pain 01/30/2017  . New onset type 2 diabetes mellitus ([place]) 02/23/2015  . Neuropathy, cervical (radicular) 01/20/2015  . OSA (obstructive sleep apnea) 02/13/2014  . Hyperlipidemia 06/19/2013  . Sickle cell trait ([place]) 01/10/2013  . 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 Health care maintenance 12/09/2012  . BPH (benign prostatic hyperplasia) 08/27/2012  . Essential hypertension 08/27/2012    Social Hx:  reports that he quit smoking about 39 years ago. His smoking use included cigarettes. He has a 5.00 pack-year smoking history. He has never used smokeless tobacco.   Objective:   BP 122/80   Pulse 67   Temp 98.8 F (37.1 C) (Oral)   Ht 5\' 6"  (1.676 m)   Wt 211 lb 12.8 oz (96.1 kg)   SpO2 97%   BMI 34.19 kg/m  Physical Exam  Gen: NAD, alert, cooperative with exam, well-appearing HEENT:     Head: Normocephalic, atraumatic    Neck: No masses palpated. No goiter. No lymphadenopathy     Ears: External ears normal, no drainage.Tympanic  membranes intact, normal light reflex bilaterally, no erythema or bulging    Eyes: PERRLA, EOMI, sclera white, normal conjunctiva    Nose: nasal turbinates moist, no nasal discharge    Throat: moist mucus membranes, no pharyngeal erythema, no tonsillar exudate. Airway is patent Cardiac: Regular rate and rhythm, normal S1/S2, no murmur, no edema, capillary refill brisk  Respiratory: Clear to auscultation bilaterally, no wheezes, non-labored breathing Psych: good insight, normal mood and affect  Assessment & Plan:   1. Viral URI with cough: Symptoms started 2 weeks ago and symptoms almost completely resolved.  Respiratory exam normal today. -Continue TheraFlu as this has been helping patient -Return precautions discussed  2. Abnormal color of sputum: Patient endorsed dark brown sputum.  Concern that this may be hemoptysis.  Has history of positive TB skin test in his chart.  Patient states he tested positive because he was born [place].  No TB symptoms and pulmonary exam reassuring.   - DG Chest 2 View; Future  Smitty Cords, MD [facility], PGY-3

## 2017-07-24 NOTE — Patient Instructions (Signed)
Thank you for coming in today, it was so nice to see you! Today we talked about:    You are likely recovering from a virus  I would like for you to get a chest xray due to having brown sputum just to make sure your lungs look ok  Continue theraflu if that helps you with cough  If we ordered any tests today, you will be notified via telephone of any abnormalities. If everything is normal you will get a letter in the mail.   If you have any questions or concerns, please do not hesitate to call the office at [phone]. You can also message me directly via MyChart.   Sincerely,  Smitty Cords, MD

## 2017-09-03 ENCOUNTER — Other Ambulatory Visit: Payer: Self-pay | Admitting: Family Medicine

## 2017-09-03 DIAGNOSIS — I10 Essential (primary) hypertension: Secondary | ICD-10-CM

## 2017-09-11 ENCOUNTER — Other Ambulatory Visit: Payer: Self-pay | Admitting: Family Medicine

## 2017-09-11 DIAGNOSIS — I10 Essential (primary) hypertension: Secondary | ICD-10-CM

## 2017-09-11 DIAGNOSIS — E785 Hyperlipidemia, unspecified: Secondary | ICD-10-CM

## 2017-09-11 MED ORDER — AMLODIPINE BESYLATE 10 MG PO TABS: 10.0000 mg | ORAL_TABLET | Freq: Every day | ORAL | 2 refills | Status: DC

## 2017-09-11 MED ORDER — ATORVASTATIN CALCIUM 40 MG PO TABS: 40.0000 mg | ORAL_TABLET | Freq: Every day | ORAL | 2 refills | Status: DC

## 2017-09-11 NOTE — Telephone Encounter (Signed)
Pt came in office requesting to refill his Amlodipine and Atorvastatin Rx. Best phone # to contact is [phone].

## 2017-09-26 ENCOUNTER — Other Ambulatory Visit: Payer: Self-pay | Admitting: Family Medicine

## 2017-09-26 DIAGNOSIS — M25561 Pain in right knee: Principal | ICD-10-CM

## 2017-09-26 DIAGNOSIS — G8929 Other chronic pain: Secondary | ICD-10-CM

## 2017-09-26 MED ORDER — NAPROXEN 375 MG PO TABS: ORAL_TABLET | ORAL | 0 refills | Status: DC

## 2017-11-19 DIAGNOSIS — N5201 Erectile dysfunction due to arterial insufficiency: Secondary | ICD-10-CM | POA: Diagnosis not present

## 2017-11-19 DIAGNOSIS — R102 Pelvic and perineal pain: Secondary | ICD-10-CM | POA: Diagnosis not present

## 2017-11-19 DIAGNOSIS — N400 Enlarged prostate without lower urinary tract symptoms: Secondary | ICD-10-CM | POA: Diagnosis not present

## 2017-12-17 DIAGNOSIS — H43812 Vitreous degeneration, left eye: Secondary | ICD-10-CM | POA: Diagnosis not present

## 2018-01-22 ENCOUNTER — Encounter: Payer: Self-pay | Admitting: Family Medicine

## 2018-01-22 ENCOUNTER — Ambulatory Visit (INDEPENDENT_AMBULATORY_CARE_PROVIDER_SITE_OTHER): Payer: Medicare Other | Admitting: Family Medicine

## 2018-01-22 VITALS — BP 101/66 | HR 81 | Ht 66.0 in | Wt 213.0 lb

## 2018-01-22 DIAGNOSIS — M25561 Pain in right knee: Secondary | ICD-10-CM | POA: Diagnosis not present

## 2018-01-22 DIAGNOSIS — M1711 Unilateral primary osteoarthritis, right knee: Secondary | ICD-10-CM

## 2018-01-22 DIAGNOSIS — G8929 Other chronic pain: Secondary | ICD-10-CM

## 2018-01-22 NOTE — Progress Notes (Signed)
PCP: Marjie Skiff, MD  Subjective:   HPI: Patient is a 67 y.o. male here for right knee pain.  Patient reports right knee pain for about the last year.  At this time he has 0/10 pain but notes that it waxes and wanes anteriorly.  It is worse with initiating movement after sitting.  He also notes stiffness with initiation of movement.  Pain is worse with stairs.  He denies any numbness or tingling in the lower extremity no associated erythema.  He does note intermittent swelling.  He has gotten relief with naproxen twice daily as needed as well as wearing a compression knee sleeve.  No associated skin changes.  Past Medical History:  Diagnosis Date  . Diabetes mellitus without complication ([place])   . Hyperlipidemia   . Hypertension     Current Outpatient Medications on File Prior to Visit  Medication Sig Dispense Refill  . amLODipine (NORVASC) 10 MG tablet Take 1 tablet (10 mg total) by mouth daily. 90 tablet 2  . atorvastatin (LIPITOR) 40 MG tablet Take 1 tablet (40 mg total) by mouth daily. 90 tablet 2  . Blood Glucose Monitoring Suppl (ONE TOUCH ULTRA 2) W/DEVICE KIT Check sugars at least 3 times daily or as directed by provider 1 each 0  . gabapentin (NEURONTIN) 100 MG capsule Take 1 capsule (100 mg total) by mouth 3 (three) times daily. 90 capsule 3  . glucose blood test strip Use One test strip to check glucose level 3 times daily.  ICD-10 code: E11.9.  Use One Touch Ultra Blue Test Strips. 100 each 5  . lisinopril-hydrochlorothiazide (PRINZIDE,ZESTORETIC) 10-12.5 MG tablet TAKE 1 TABLET BY MOUTH ONCE DAILY 90 tablet 1  . metFORMIN (GLUCOPHAGE) 500 MG tablet TAKE ONE TABLET BY MOUTH ONCE DAILY WITH BREAKFAST (Patient not taking: Reported on 05/23/2016) 90 tablet 1  . Multiple Vitamin (MULTIVITAMIN WITH MINERALS) TABS tablet Take 1 tablet by mouth daily.    . naproxen (NAPROSYN) 375 MG tablet TAKE 1 TABLET BY MOUTH TWICE DAILY WITH A MEAL 30 tablet 0  . naproxen (NAPROSYN) 500 MG  tablet TAKE 1 TABLET BY MOUTH 2 TIMES DAILY AS NEEDED. 60 tablet 0  . ONETOUCH DELICA LANCETS 02V MISC Use to test blood glucose 3 times daily. ICD-10 code: E11.9. 100 each 5   No current facility-administered medications on file prior to visit.     Past Surgical History:  Procedure Laterality Date  . HERNIA REPAIR    . PROSTATE SURGERY    . SHOULDER SURGERY      No Known Allergies  Social History   Socioeconomic History  . Marital status: Married    Spouse name: Not on file  . Number of children: Not on file  . Years of education: Not on file  . Highest education level: Not on file  Occupational History  . Not on file  Social Needs  . Financial resource strain: Not on file  . Food insecurity:    Worry: Not on file    Inability: Not on file  . Transportation needs:    Medical: Not on file    Non-medical: Not on file  Tobacco Use  . Smoking status: Former Smoker    Packs/day: 1.00    Years: 5.00    Pack years: 5.00    Types: Cigarettes    Last attempt to quit: 1980    Years since quitting: 39.8  . Smokeless tobacco: Never Used  Substance and Sexual Activity  . Alcohol use: No  .  Drug use: No  . Sexual activity: Yes  Lifestyle  . Physical activity:    Days per week: Not on file    Minutes per session: Not on file  . Stress: Not on file  Relationships  . Social connections:    Talks on phone: Not on file    Gets together: Not on file    Attends religious service: Not on file    Active member of club or organization: Not on file    Attends meetings of clubs or organizations: Not on file    Relationship status: Not on file  . Intimate partner violence:    Fear of current or ex partner: Not on file    Emotionally abused: Not on file    Physically abused: Not on file    Forced sexual activity: Not on file  Other Topics Concern  . Not on file  Social History Narrative  . Not on file    Family History  Problem Relation Age of Onset  . Diabetes Mother    . Hypertension Mother   . Diabetes Father   . Hypertension Father   . Diabetes Brother   . Diabetes Brother     BP 101/66   Pulse 81   Ht '5\' 6"'$  (1.676 m)   Wt 213 lb (96.6 kg)   BMI 34.38 kg/m   Review of Systems: See HPI above.     Objective:  Physical Exam:  Gen: awake, alert, NAD, comfortable in exam room Pulm: breathing unlabored  Right knee: - Inspection: no gross deformity. No swelling/effusion, erythema or bruising. Skin intact - Palpation: Tenderness over the medial joint line - ROM: full active ROM with flexion and extension in knee - Strength: 5/5 strength - Neuro/vasc: NV intact - Special Tests: - LIGAMENTS: negative anterior and posterior drawer, negative Lachman's, no MCL or LCL laxity  -- MENISCUS: negative McMurray's -- PF JOINT: nml patellar mobility without apprehension  Left knee: No obvious deformity swelling, or erythema No tenderness palpation Full active range of motion with 5/5 strength N/V intact Cruciate and collateral ligaments without laxity  Hips: Full range of motion with IR/ER without pain   Assessment & Plan:  1.  Right knee pain- knee pain secondary to arthritis.  No ligamentous or meniscal pathology on exam. - Continue Aleve as needed for pain - Ice as needed - Home exercise program for quadricep strengthening - Recommend knee sleeve for comfort - Consider steroid injections if pain worsens - Follow-up as needed

## 2018-01-22 NOTE — Patient Instructions (Addendum)
Your pain is due to arthritis. These are the different medications you can take for this: Tylenol 500mg  1-2 tabs three times a day for pain. Capsaicin, aspercreme, or biofreeze topically up to four times a day may also help with pain. Some supplements that may help for arthritis: Boswellia extract, curcumin, pycnogenol Aleve 1-2 tabs twice a day with food as needed Cortisone injections are an option. If cortisone injections do not help, there are different types of shots that may help but they take longer to take effect. It's important that you continue to stay active. Brace when up and walking around if this is more comfortable. Straight leg raises, knee extensions 3 sets of 10 once a day (add ankle weight if these become too easy). Consider physical therapy to strengthen muscles around the joint that hurts to take pressure off of the joint itself. Shoe inserts with good arch support may be helpful. Heat or ice 15 minutes at a time 3-4 times a day as needed to help with pain. Water aerobics and cycling with low resistance are the best two types of exercise for arthritis though any exercise is ok as long as it doesn't worsen the pain. Follow up with me as needed.

## 2018-02-18 DIAGNOSIS — Q5561 Curvature of penis (lateral): Secondary | ICD-10-CM | POA: Diagnosis not present

## 2018-02-18 DIAGNOSIS — R102 Pelvic and perineal pain: Secondary | ICD-10-CM | POA: Diagnosis not present

## 2018-03-13 DIAGNOSIS — N5201 Erectile dysfunction due to arterial insufficiency: Secondary | ICD-10-CM | POA: Diagnosis not present

## 2018-03-13 DIAGNOSIS — N486 Induration penis plastica: Secondary | ICD-10-CM | POA: Diagnosis not present

## 2018-03-13 DIAGNOSIS — R102 Pelvic and perineal pain: Secondary | ICD-10-CM | POA: Diagnosis not present

## 2018-03-19 DIAGNOSIS — R102 Pelvic and perineal pain: Secondary | ICD-10-CM | POA: Diagnosis not present

## 2018-03-19 DIAGNOSIS — N400 Enlarged prostate without lower urinary tract symptoms: Secondary | ICD-10-CM | POA: Diagnosis not present

## 2018-03-19 DIAGNOSIS — N433 Hydrocele, unspecified: Secondary | ICD-10-CM | POA: Diagnosis not present

## 2018-03-25 DIAGNOSIS — H2513 Age-related nuclear cataract, bilateral: Secondary | ICD-10-CM | POA: Diagnosis not present

## 2018-03-25 DIAGNOSIS — H5213 Myopia, bilateral: Secondary | ICD-10-CM | POA: Diagnosis not present

## 2018-03-25 DIAGNOSIS — E119 Type 2 diabetes mellitus without complications: Secondary | ICD-10-CM | POA: Diagnosis not present

## 2018-03-25 LAB — HM DIABETES EYE EXAM

## 2018-03-27 ENCOUNTER — Other Ambulatory Visit: Payer: Self-pay | Admitting: Family Medicine

## 2018-03-27 DIAGNOSIS — I10 Essential (primary) hypertension: Secondary | ICD-10-CM

## 2018-04-18 ENCOUNTER — Encounter: Payer: Self-pay | Admitting: Family Medicine

## 2018-04-25 ENCOUNTER — Other Ambulatory Visit: Payer: Self-pay | Admitting: *Deleted

## 2018-04-25 DIAGNOSIS — M5412 Radiculopathy, cervical region: Secondary | ICD-10-CM

## 2018-04-25 DIAGNOSIS — I10 Essential (primary) hypertension: Secondary | ICD-10-CM

## 2018-04-25 DIAGNOSIS — E785 Hyperlipidemia, unspecified: Secondary | ICD-10-CM

## 2018-04-25 MED ORDER — LISINOPRIL-HYDROCHLOROTHIAZIDE 10-12.5 MG PO TABS: 1.0000 | ORAL_TABLET | Freq: Every day | ORAL | 0 refills | Status: DC

## 2018-04-25 MED ORDER — NAPROXEN 500 MG PO TABS: ORAL_TABLET | ORAL | 0 refills | Status: DC

## 2018-04-25 MED ORDER — AMLODIPINE BESYLATE 10 MG PO TABS: 10.0000 mg | ORAL_TABLET | Freq: Every day | ORAL | 0 refills | Status: DC

## 2018-04-25 MED ORDER — ATORVASTATIN CALCIUM 40 MG PO TABS: 40.0000 mg | ORAL_TABLET | Freq: Every day | ORAL | 0 refills | Status: DC

## 2018-04-25 NOTE — Telephone Encounter (Signed)
Pt calls asking for a refill on meds.  He has not been seen since April 2019, appt made for March.  Is requesting refill until appt . Shatisha Falter, Salome Spotted, CMA

## 2018-06-05 ENCOUNTER — Ambulatory Visit: Payer: Medicare Other | Admitting: Family Medicine

## 2018-06-24 ENCOUNTER — Telehealth: Payer: Self-pay | Admitting: Family Medicine

## 2018-06-24 NOTE — Telephone Encounter (Signed)
Patients wife called about canceling appt for Wed 06/26/18 and for patient to receive a call if need for virtual visit over the phone. Patients appointment was for med refills but was told to come into office for medication refills.

## 2018-06-26 ENCOUNTER — Ambulatory Visit: Payer: Medicare Other | Admitting: Family Medicine

## 2018-08-01 ENCOUNTER — Other Ambulatory Visit: Payer: Self-pay | Admitting: Family Medicine

## 2018-08-01 DIAGNOSIS — R102 Pelvic and perineal pain: Secondary | ICD-10-CM | POA: Diagnosis not present

## 2018-08-01 DIAGNOSIS — N5201 Erectile dysfunction due to arterial insufficiency: Secondary | ICD-10-CM | POA: Diagnosis not present

## 2018-08-01 DIAGNOSIS — I10 Essential (primary) hypertension: Secondary | ICD-10-CM

## 2018-08-01 DIAGNOSIS — E785 Hyperlipidemia, unspecified: Secondary | ICD-10-CM

## 2018-08-01 DIAGNOSIS — N400 Enlarged prostate without lower urinary tract symptoms: Secondary | ICD-10-CM | POA: Diagnosis not present

## 2018-08-29 ENCOUNTER — Encounter: Payer: Self-pay | Admitting: Family Medicine

## 2018-08-29 ENCOUNTER — Ambulatory Visit (INDEPENDENT_AMBULATORY_CARE_PROVIDER_SITE_OTHER): Payer: Medicare Other | Admitting: Family Medicine

## 2018-08-29 ENCOUNTER — Other Ambulatory Visit: Payer: Self-pay

## 2018-08-29 VITALS — BP 102/60 | HR 80 | Ht 66.0 in | Wt 215.5 lb

## 2018-08-29 DIAGNOSIS — I10 Essential (primary) hypertension: Secondary | ICD-10-CM

## 2018-08-29 DIAGNOSIS — K625 Hemorrhage of anus and rectum: Secondary | ICD-10-CM

## 2018-08-29 DIAGNOSIS — E785 Hyperlipidemia, unspecified: Secondary | ICD-10-CM

## 2018-08-29 DIAGNOSIS — E119 Type 2 diabetes mellitus without complications: Secondary | ICD-10-CM

## 2018-08-29 HISTORY — DX: Hemorrhage of anus and rectum: K62.5

## 2018-08-29 LAB — POCT GLYCOSYLATED HEMOGLOBIN (HGB A1C): HbA1c, POC (controlled diabetic range): 7 % (ref 0.0–7.0)

## 2018-08-29 MED ORDER — METFORMIN HCL 500 MG PO TABS: 500.0000 mg | ORAL_TABLET | Freq: Two times a day (BID) | ORAL | 2 refills | Status: DC

## 2018-08-29 NOTE — Patient Instructions (Addendum)
It was great seeing you today! We have addressed the following issues today  1. Cut down on Norvasc from 10 mg to 5 mg, cut the tablet in half 2. Contact Gastroenterology at [facility] to discuss internal hemorrhoids 3. I will follow up on your lab results.  If we did any lab work today, and the results require attention, either me or my nurse will get in touch with you. If everything is normal, you will get a letter in mail and a message via . If you don't hear from [place] in two weeks, please give [place] a call. Otherwise, we look forward to seeing you again at your next visit. If you have any questions or concerns before then, please call the clinic at [phone].  Please bring all your medications to every doctors visit  Sign up for My Chart to have easy access to your labs results, and communication with your Primary care physician. Please ask Front Desk for some assistance.   Please check-out at the front desk before leaving the clinic.    Take Care,   Dr. Andy Gauss

## 2018-08-29 NOTE — Assessment & Plan Note (Addendum)
A1c 7.0 up from 5.8. Will resume Metformin 500 bid. Will work therapeutic lifestyle changes. Will increase to 1000 mg bid if no GI symptoms. Patient has tolerated in the past. Continue Crestor 40 mg. --Repeat BMP, CBC

## 2018-08-29 NOTE — Assessment & Plan Note (Signed)
Continue Crestor 40 mg daily. Repeat lipid panel.

## 2018-08-29 NOTE — Progress Notes (Signed)
   Subjective:    Patient ID: Jason Rhodes, male    DOB: 1950/08/17, 68 y.o.   MRN: [id number]   CC: Blood in stool   HPI: Patient is a 68 yo male who presents today complaining of blood after wiping. Patient report two days he noticed a little bit of blood on the toilet paper after wiping. He denies any blood on the stool or in the bowl. He noticed more blood yesterday on the toilet paper and nothing on the stool. He denies any itching, rectal pain or pain associated with wiping. Denies tarry black stool. Patient has a history of internal hemorrhoids that were banded in 2018 to [facility]. They were discovered during his most recent colonoscopy in June 2018. Patient denies any nausea, vomiting, abdominal pain, SOB or chest pain. No history of external hemorrhoids.  Smoking status reviewed   ROS: all other systems were reviewed and are negative other than in the HPI   Past Medical History:  Diagnosis Date  . Diabetes mellitus without complication ([place])   . Hyperlipidemia   . Hypertension     Past Surgical History:  Procedure Laterality Date  . HERNIA REPAIR    . PROSTATE SURGERY    . SHOULDER SURGERY      Past medical history, surgical, family, and social history reviewed and updated in the EMR as appropriate.  Objective:  BP 102/60   Pulse 80   Ht 5\' 6"  (1.676 m)   Wt 215 lb 8 oz (97.8 kg)   SpO2 97%   BMI 34.78 kg/m   Vitals and nursing note reviewed  General: NAD, pleasant, able to participate in exam Cardiac: RRR, normal heart sounds, no murmurs. 2+ radial and PT pulses bilaterally Respiratory: CTAB, normal effort, No wheezes, rales or rhonchi Abdomen: soft, nontender, nondistended, no hepatic or splenomegaly, +BS Extremities: no edema or cyanosis. WWP. Skin: warm and dry, no rashes noted Neuro: alert and oriented x4, no focal deficits Psych: Normal affect and mood   Assessment & Plan:   Essential hypertension BP today 102/60. Reports some occasional dizziness.  Will decrease Norvasc to 5 mg and continue with Lisinopril-HCTZ. Will retitrate as needed.  Painless rectal bleeding Patient presents with painless rectal bleeding for 2 days. No tarry stools or BRB on the stool, most bleeding reported has been after wiping. No itching or  discomfort reported making fissure less likely. Patient has a history of internal hemorrhoids which were banded 2 years ago. Given painless bleeding suspect recurrence of internal hemorrhoids. Last colonoscopy in June 2018 (sessile polyp) resected. Low concern for malignancy.   New onset type 2 diabetes mellitus ([facility]) A1c 7.0 up from 5.8. Will resume Metformin 500 bid. Will work therapeutic lifestyle changes. Will increase to 1000 mg bid if no GI symptoms. Patient has tolerated in the past. Continue Crestor 40 mg. --Repeat BMP, CBC  Hyperlipidemia Continue Crestor 40 mg daily. Repeat lipid panel.    Marjie Skiff, MD [place] PGY-3

## 2018-08-29 NOTE — Assessment & Plan Note (Signed)
BP today 102/60. Reports some occasional dizziness. Will decrease Norvasc to 5 mg and continue with Lisinopril-HCTZ. Will retitrate as needed.

## 2018-08-29 NOTE — Assessment & Plan Note (Signed)
Patient presents with painless rectal bleeding for 2 days. No tarry stools or BRB on the stool, most bleeding reported has been after wiping. No itching or  discomfort reported making fissure less likely. Patient has a history of internal hemorrhoids which were banded 2 years ago. Given painless bleeding suspect recurrence of internal hemorrhoids. Last colonoscopy in June 2018 (sessile polyp) resected. Low concern for malignancy.

## 2018-08-30 LAB — LIPID PANEL
Chol/HDL Ratio: 2.7 ratio (ref 0.0–5.0)
Cholesterol, Total: 114 mg/dL (ref 100–199)
HDL: 43 mg/dL (ref >39)
LDL Calculated: 54 mg/dL (ref 0–99)
Triglycerides: 84 mg/dL (ref 0–149)
VLDL Cholesterol Cal: 17 mg/dL (ref 5–40)

## 2018-08-30 LAB — CBC WITH DIFFERENTIAL/PLATELET
Basophils Absolute: 0 10*3/uL (ref 0.0–0.2)
Basos: 1 %
EOS (ABSOLUTE): 0 10*3/uL (ref 0.0–0.4)
Eos: 1 %
Hematocrit: 39.6 % (ref 37.5–51.0)
Hemoglobin: 12.9 g/dL — ABNORMAL LOW (ref 13.0–17.7)
Immature Grans (Abs): 0 10*3/uL (ref 0.0–0.1)
Immature Granulocytes: 0 %
Lymphocytes Absolute: 2 10*3/uL (ref 0.7–3.1)
Lymphs: 42 %
MCH: 29.7 pg (ref 26.6–33.0)
MCHC: 32.6 g/dL (ref 31.5–35.7)
MCV: 91 fL (ref 79–97)
Monocytes Absolute: 0.4 10*3/uL (ref 0.1–0.9)
Monocytes: 8 %
Neutrophils Absolute: 2.4 10*3/uL (ref 1.4–7.0)
Neutrophils: 48 %
Platelets: 265 10*3/uL (ref 150–450)
RBC: 4.34 x10E6/uL (ref 4.14–5.80)
RDW: 11.9 % (ref 11.6–15.4)
WBC: 4.9 10*3/uL (ref 3.4–10.8)

## 2018-08-30 LAB — BASIC METABOLIC PANEL
BUN/Creatinine Ratio: 8 — ABNORMAL LOW (ref 10–24)
BUN: 9 mg/dL (ref 8–27)
CO2: 26 mmol/L (ref 20–29)
Calcium: 9.9 mg/dL (ref 8.6–10.2)
Chloride: 97 mmol/L (ref 96–106)
Creatinine, Ser: 1.16 mg/dL (ref 0.76–1.27)
GFR calc Af Amer: 74 mL/min/{1.73_m2} (ref >59)
GFR calc non Af Amer: 64 mL/min/{1.73_m2} (ref >59)
Glucose: 137 mg/dL — ABNORMAL HIGH (ref 65–99)
Potassium: 4 mmol/L (ref 3.5–5.2)
Sodium: 138 mmol/L (ref 134–144)

## 2018-09-11 DIAGNOSIS — K625 Hemorrhage of anus and rectum: Secondary | ICD-10-CM | POA: Diagnosis not present

## 2018-10-28 ENCOUNTER — Other Ambulatory Visit: Payer: Self-pay

## 2018-10-28 DIAGNOSIS — N5201 Erectile dysfunction due to arterial insufficiency: Secondary | ICD-10-CM | POA: Diagnosis not present

## 2018-10-28 DIAGNOSIS — I10 Essential (primary) hypertension: Secondary | ICD-10-CM

## 2018-10-28 DIAGNOSIS — Q5561 Curvature of penis (lateral): Secondary | ICD-10-CM | POA: Diagnosis not present

## 2018-10-28 DIAGNOSIS — N401 Enlarged prostate with lower urinary tract symptoms: Secondary | ICD-10-CM | POA: Diagnosis not present

## 2018-10-28 DIAGNOSIS — R351 Nocturia: Secondary | ICD-10-CM | POA: Diagnosis not present

## 2018-10-28 MED ORDER — LISINOPRIL-HYDROCHLOROTHIAZIDE 10-12.5 MG PO TABS: 1.0000 | ORAL_TABLET | Freq: Every day | ORAL | 0 refills | Status: DC

## 2018-10-31 ENCOUNTER — Other Ambulatory Visit: Payer: Self-pay | Admitting: *Deleted

## 2018-10-31 DIAGNOSIS — M5412 Radiculopathy, cervical region: Secondary | ICD-10-CM

## 2018-11-01 MED ORDER — NAPROXEN 500 MG PO TABS: ORAL_TABLET | ORAL | 0 refills | Status: DC

## 2018-11-21 ENCOUNTER — Other Ambulatory Visit: Payer: Self-pay | Admitting: *Deleted

## 2018-11-21 DIAGNOSIS — E785 Hyperlipidemia, unspecified: Secondary | ICD-10-CM

## 2018-11-22 MED ORDER — ATORVASTATIN CALCIUM 40 MG PO TABS: 40.0000 mg | ORAL_TABLET | Freq: Every day | ORAL | 1 refills | Status: DC

## 2018-12-05 DIAGNOSIS — Z125 Encounter for screening for malignant neoplasm of prostate: Secondary | ICD-10-CM | POA: Diagnosis not present

## 2018-12-05 DIAGNOSIS — N4889 Other specified disorders of penis: Secondary | ICD-10-CM | POA: Diagnosis not present

## 2019-01-07 ENCOUNTER — Other Ambulatory Visit: Payer: Self-pay | Admitting: Family Medicine

## 2019-01-07 DIAGNOSIS — I10 Essential (primary) hypertension: Secondary | ICD-10-CM

## 2019-01-08 ENCOUNTER — Other Ambulatory Visit: Payer: Self-pay

## 2019-01-08 DIAGNOSIS — I10 Essential (primary) hypertension: Secondary | ICD-10-CM

## 2019-01-12 MED ORDER — METFORMIN HCL 500 MG PO TABS: 500.0000 mg | ORAL_TABLET | Freq: Two times a day (BID) | ORAL | 2 refills | Status: DC

## 2019-01-12 MED ORDER — AMLODIPINE BESYLATE 10 MG PO TABS: 10.0000 mg | ORAL_TABLET | Freq: Every day | ORAL | 1 refills | Status: DC

## 2019-01-27 DIAGNOSIS — N401 Enlarged prostate with lower urinary tract symptoms: Secondary | ICD-10-CM | POA: Diagnosis not present

## 2019-01-27 DIAGNOSIS — N400 Enlarged prostate without lower urinary tract symptoms: Secondary | ICD-10-CM | POA: Diagnosis not present

## 2019-01-27 DIAGNOSIS — N486 Induration penis plastica: Secondary | ICD-10-CM | POA: Diagnosis not present

## 2019-01-27 DIAGNOSIS — N139 Obstructive and reflux uropathy, unspecified: Secondary | ICD-10-CM | POA: Diagnosis not present

## 2019-01-27 DIAGNOSIS — N529 Male erectile dysfunction, unspecified: Secondary | ICD-10-CM | POA: Diagnosis not present

## 2019-03-05 DIAGNOSIS — N400 Enlarged prostate without lower urinary tract symptoms: Secondary | ICD-10-CM | POA: Diagnosis not present

## 2019-04-03 ENCOUNTER — Ambulatory Visit (INDEPENDENT_AMBULATORY_CARE_PROVIDER_SITE_OTHER): Payer: Medicare Other | Admitting: Family Medicine

## 2019-04-03 ENCOUNTER — Other Ambulatory Visit: Payer: Self-pay

## 2019-04-03 ENCOUNTER — Encounter: Payer: Self-pay | Admitting: Family Medicine

## 2019-04-03 VITALS — BP 104/62 | HR 95 | Wt 208.0 lb

## 2019-04-03 DIAGNOSIS — M25512 Pain in left shoulder: Secondary | ICD-10-CM

## 2019-04-03 DIAGNOSIS — E119 Type 2 diabetes mellitus without complications: Secondary | ICD-10-CM | POA: Diagnosis not present

## 2019-04-03 HISTORY — DX: Pain in left shoulder: M25.512

## 2019-04-03 LAB — POCT GLYCOSYLATED HEMOGLOBIN (HGB A1C): HbA1c, POC (controlled diabetic range): 5.5 % (ref 0.0–7.0)

## 2019-04-03 NOTE — Progress Notes (Signed)
Subjective:  Jason Rhodes is a 69 y.o. male who presents to the [facility] today with a chief complaint of left shoulder pain and swelling.  HPI:  Shoulder pain Patient reports that over the past 2 to 3 weeks he has been having intermittent shoulder pain in his left shoulder.  He also reports swelling that resolved spontaneously.  His wife says that she thinks it may be fluid right over his shoulder blade.  He reports that it is worse with activity and improves with rest.  He gets in the shower and puts hot water on it which helps with the pain as well.  He has not taken any medications because he is getting ready to have procedure done and I told him not to take any ibuprofen.  Patient also says that he will intermittently have a feeling that his nipple is being pinched which is associated with the shoulder pain.  Patient also says that at night he has had intermittent episodes of chills but has not been febrile.  Diabetes Patient has questions regarding what his A1c is.  He has been taking his Metformin 500 mg twice daily as prescribed.  He denies any issues with diarrhea but does say that the Metformin helps his bowel movements stay regular.  A1c on previous check was 7.0 on August 29, 2018.  Objective:  Physical Exam: BP 104/62   Pulse 95   Wt 208 lb (94.3 kg)   SpO2 98%   BMI 33.57 kg/m   Gen:NAD, resting comfortably MSK: Patient has point tenderness along left inferior border of left scapula.  Patient has pain with abduction of the left shoulder.  Shoulders appear symmetric and I noticed no edema in left shoulder or scapular area.  There is no erythema or warmness. Skin: warm, dry Neuro: grossly normal, moves all extremities   Results for orders placed or performed in visit on 04/03/19 (from the past 72 hour(s))  HgB A1c     Status: None   Collection Time: 04/03/19  4:43 PM  Result Value Ref Range   Hemoglobin A1C     HbA1c POC (<> result, manual entry)     HbA1c, POC (prediabetic  range)     HbA1c, POC (controlled diabetic range) 5.5 0.0 - 7.0 %     Assessment/Plan:  New onset type 2 diabetes mellitus ([place]) Patient's most recent A1c was 7.0 in June 2020.  Recheck today showed an A1c of 5.5.  He takes Metformin 500 mg twice daily. -Continue Metformin 500 mg twice daily -Recheck hemoglobin A1c in 3 months.  Left shoulder pain Patient reports that the left shoulder pain has been going on for 2 to 3 months.  He also reports intermittent swelling which his wife and daughter think is fluid.  He has been using hot water with pain relief but has not been taking any other medications.  Is worse with activity and improves with rest.  It initially started when patient was working in the yard.  It is also associated with "feeling that my nipple is being pinched".  This is most likely a muscle strain.  He has point tenderness along the inferior border of his left scapula. -Recommend rest -Provide information regarding rhomboid rehabilitation -Recommend Tylenol as needed for pain -Discussed return precautions if febrile, edema returns and does not resolve, warmth in his shoulder -Consider imaging if continues to have issues    Lab Orders     HgB A1c  No orders of the defined types  were placed in this encounter.    Gifford Shave, MD  PGY-1, [facility]  04/03/19 5:23 PM

## 2019-04-03 NOTE — Assessment & Plan Note (Signed)
Patient's most recent A1c was 7.0 in June 2020.  Recheck today showed an A1c of 5.5.  He takes Metformin 500 mg twice daily. -Continue Metformin 500 mg twice daily -Recheck hemoglobin A1c in 3 months.

## 2019-04-03 NOTE — Assessment & Plan Note (Signed)
Patient reports that the left shoulder pain has been going on for 2 to 3 months.  He also reports intermittent swelling which his wife and daughter think is fluid.  He has been using hot water with pain relief but has not been taking any other medications.  Is worse with activity and improves with rest.  It initially started when patient was working in the yard.  It is also associated with "feeling that my nipple is being pinched".  This is most likely a muscle strain.  He has point tenderness along the inferior border of his left scapula. -Recommend rest -Provide information regarding rhomboid rehabilitation -Recommend Tylenol as needed for pain -Discussed return precautions if febrile, edema returns and does not resolve, warmth in his shoulder -Consider imaging if continues to have issues

## 2019-04-03 NOTE — Patient Instructions (Signed)
It was a pleasure to meet you today.  I feel that your shoulder pain is most likely related to a muscle strain.  I would like you to rest your shoulder.  Please use an ice pack or heating pad as needed.  You may also use Tylenol for pain relief.  If you notice any increased swelling or swelling that would not go away, heat, or fever please call for further evaluation.  Included information about shoulder sprains.  Although I believe strained a muscle in your back the treatment section may be useful.   Shoulder Sprain  A shoulder sprain is a partial or complete tear in one of the tough, fiber-like tissues (ligaments) in the shoulder. The ligaments in the shoulder help to hold the shoulder in place. What are the causes? This condition may be caused by:  A fall.  A hit to the shoulder.  A twist of the arm. What increases the risk? You are more likely to develop this condition if you:  Play sports.  Have problems with balance or coordination. What are the signs or symptoms? Symptoms of this condition include:  Pain when moving the shoulder.  Limited ability to move the shoulder.  Swelling and tenderness on top of the shoulder.  Warmth in the shoulder.  A change in the shape of the shoulder.  Redness or bruising on the shoulder. How is this diagnosed? This condition is diagnosed with:  A physical exam. During the exam, you may be asked to do simple exercises with your shoulder.  Imaging tests such as X-rays, MRI, or a CT scan. These tests can show how severe the sprain is. How is this treated? This condition may be treated with:  Rest.  Pain medicine.  Ice.  A sling or brace. This is used to keep the arm still while the shoulder is healing.  Physical therapy or rehabilitation exercises. These help to improve the range of motion and strength of the shoulder.  Surgery (rare). Surgery may be needed if the sprain caused a joint to become unstable. Surgery may also be  needed to reduce pain. Some people may develop ongoing shoulder pain or lose some range of motion in the shoulder. However, most people do not develop long-term problems. Follow these instructions at home:  If you have a sling or brace:  Wear the sling or brace as told by your health care provider. Remove it only as told by your health care provider.  Loosen the sling or brace if your fingers tingle, become numb, or turn cold and blue.  Keep the sling or brace clean.  If the sling or brace is not waterproof: ? Do not let it get wet. ? Cover it with a watertight covering when you take a bath or shower. Activity  Rest your shoulder.  Move your arm only as much as told by your health care provider, but move your hand and fingers often to prevent stiffness and swelling.  Return to your normal activities as told by your health care provider. Ask your health care provider what activities are safe for you.  Ask your health care provider when it is safe for you to drive if you have a sling or brace on your shoulder.  If you were shown how to do any exercises, do them as told by your health care provider. General instructions  If directed, put ice on the affected area. ? Put ice in a plastic bag. ? Place a towel between your skin and  the bag. ? Leave the ice on for 20 minutes, 2-3 times a day.  Take over-the-counter and prescription medicines only as told by your health care provider.  Do not use any products that contain nicotine or tobacco, such as cigarettes, e-cigarettes, and chewing tobacco. These can delay healing. If you need help quitting, ask your health care provider.  Keep all follow-up visits as told by your health care provider. This is important. Contact a health care provider if:  Your pain gets worse.  Your pain is not relieved with medicines.  You have increased redness or swelling. Get help right away if:  You have a fever.  You cannot move your arm or  shoulder.  You develop severe numbness or tingling in your arm, hand, or fingers.  Your arm, hand, or fingers feel cold and turn blue, white, or gray. Summary  A shoulder sprain is a partial or complete tear in one of the tough, fiber-like tissues (ligaments) in the shoulder.  This condition may be caused by a fall, a hit to the shoulder, or a twist of the arm.  Treatment usually includes rest, ice, and pain medicine as needed.  If you have a sling or brace, wear it as told by your health care provider. Remove it only as told by your health care provider. This information is not intended to replace advice given to you by your health care provider. Make sure you discuss any questions you have with your health care provider. Document Revised: 08/17/2017 Document Reviewed: 08/17/2017 Elsevier Patient Education  [place].

## 2019-04-07 DIAGNOSIS — E119 Type 2 diabetes mellitus without complications: Secondary | ICD-10-CM | POA: Diagnosis not present

## 2019-04-07 DIAGNOSIS — H25013 Cortical age-related cataract, bilateral: Secondary | ICD-10-CM | POA: Diagnosis not present

## 2019-04-07 DIAGNOSIS — H43813 Vitreous degeneration, bilateral: Secondary | ICD-10-CM | POA: Diagnosis not present

## 2019-04-07 DIAGNOSIS — H2513 Age-related nuclear cataract, bilateral: Secondary | ICD-10-CM | POA: Diagnosis not present

## 2019-04-07 LAB — HM DIABETES EYE EXAM

## 2019-04-10 ENCOUNTER — Other Ambulatory Visit: Payer: Self-pay | Admitting: *Deleted

## 2019-04-10 MED ORDER — METFORMIN HCL 500 MG PO TABS: 500.0000 mg | ORAL_TABLET | Freq: Two times a day (BID) | ORAL | 2 refills | Status: DC

## 2019-04-11 ENCOUNTER — Other Ambulatory Visit: Payer: Self-pay | Admitting: Family Medicine

## 2019-04-11 DIAGNOSIS — I10 Essential (primary) hypertension: Secondary | ICD-10-CM

## 2019-04-14 ENCOUNTER — Other Ambulatory Visit: Payer: Self-pay

## 2019-04-14 MED ORDER — METFORMIN HCL 500 MG PO TABS: 500.0000 mg | ORAL_TABLET | Freq: Two times a day (BID) | ORAL | 2 refills | Status: DC

## 2019-04-29 DIAGNOSIS — N400 Enlarged prostate without lower urinary tract symptoms: Secondary | ICD-10-CM | POA: Diagnosis not present

## 2019-05-02 DIAGNOSIS — Z01818 Encounter for other preprocedural examination: Secondary | ICD-10-CM | POA: Diagnosis not present

## 2019-05-05 DIAGNOSIS — C61 Malignant neoplasm of prostate: Secondary | ICD-10-CM | POA: Diagnosis not present

## 2019-05-05 DIAGNOSIS — E109 Type 1 diabetes mellitus without complications: Secondary | ICD-10-CM | POA: Diagnosis not present

## 2019-05-05 DIAGNOSIS — Z79899 Other long term (current) drug therapy: Secondary | ICD-10-CM | POA: Diagnosis not present

## 2019-05-05 DIAGNOSIS — E669 Obesity, unspecified: Secondary | ICD-10-CM | POA: Diagnosis not present

## 2019-05-05 DIAGNOSIS — I10 Essential (primary) hypertension: Secondary | ICD-10-CM | POA: Diagnosis not present

## 2019-05-05 DIAGNOSIS — Z87891 Personal history of nicotine dependence: Secondary | ICD-10-CM | POA: Diagnosis not present

## 2019-05-05 DIAGNOSIS — R350 Frequency of micturition: Secondary | ICD-10-CM | POA: Diagnosis not present

## 2019-05-05 DIAGNOSIS — E785 Hyperlipidemia, unspecified: Secondary | ICD-10-CM | POA: Diagnosis not present

## 2019-05-05 DIAGNOSIS — Z7984 Long term (current) use of oral hypoglycemic drugs: Secondary | ICD-10-CM | POA: Diagnosis not present

## 2019-05-05 DIAGNOSIS — N401 Enlarged prostate with lower urinary tract symptoms: Secondary | ICD-10-CM | POA: Diagnosis not present

## 2019-05-05 DIAGNOSIS — E7800 Pure hypercholesterolemia, unspecified: Secondary | ICD-10-CM | POA: Diagnosis not present

## 2019-05-05 DIAGNOSIS — N138 Other obstructive and reflux uropathy: Secondary | ICD-10-CM | POA: Diagnosis not present

## 2019-05-05 DIAGNOSIS — N400 Enlarged prostate without lower urinary tract symptoms: Secondary | ICD-10-CM | POA: Diagnosis not present

## 2019-05-05 DIAGNOSIS — Z6831 Body mass index (BMI) 31.0-31.9, adult: Secondary | ICD-10-CM | POA: Diagnosis not present

## 2019-05-06 DIAGNOSIS — E109 Type 1 diabetes mellitus without complications: Secondary | ICD-10-CM | POA: Diagnosis not present

## 2019-05-06 DIAGNOSIS — Z7984 Long term (current) use of oral hypoglycemic drugs: Secondary | ICD-10-CM | POA: Diagnosis not present

## 2019-05-06 DIAGNOSIS — Z87891 Personal history of nicotine dependence: Secondary | ICD-10-CM | POA: Diagnosis not present

## 2019-05-06 DIAGNOSIS — I10 Essential (primary) hypertension: Secondary | ICD-10-CM | POA: Diagnosis not present

## 2019-05-06 DIAGNOSIS — E7800 Pure hypercholesterolemia, unspecified: Secondary | ICD-10-CM | POA: Diagnosis not present

## 2019-05-06 DIAGNOSIS — N401 Enlarged prostate with lower urinary tract symptoms: Secondary | ICD-10-CM | POA: Diagnosis not present

## 2019-05-27 ENCOUNTER — Other Ambulatory Visit: Payer: Self-pay | Admitting: Family Medicine

## 2019-05-27 DIAGNOSIS — E785 Hyperlipidemia, unspecified: Secondary | ICD-10-CM

## 2019-06-11 DIAGNOSIS — N401 Enlarged prostate with lower urinary tract symptoms: Secondary | ICD-10-CM | POA: Diagnosis not present

## 2019-06-11 DIAGNOSIS — C801 Malignant (primary) neoplasm, unspecified: Secondary | ICD-10-CM

## 2019-06-11 DIAGNOSIS — Z87891 Personal history of nicotine dependence: Secondary | ICD-10-CM | POA: Diagnosis not present

## 2019-06-11 DIAGNOSIS — Z7984 Long term (current) use of oral hypoglycemic drugs: Secondary | ICD-10-CM | POA: Diagnosis not present

## 2019-06-11 DIAGNOSIS — M549 Dorsalgia, unspecified: Secondary | ICD-10-CM | POA: Diagnosis not present

## 2019-06-11 DIAGNOSIS — E109 Type 1 diabetes mellitus without complications: Secondary | ICD-10-CM | POA: Diagnosis not present

## 2019-06-11 DIAGNOSIS — R300 Dysuria: Secondary | ICD-10-CM | POA: Diagnosis not present

## 2019-06-11 DIAGNOSIS — C61 Malignant neoplasm of prostate: Secondary | ICD-10-CM | POA: Diagnosis not present

## 2019-06-11 DIAGNOSIS — E7800 Pure hypercholesterolemia, unspecified: Secondary | ICD-10-CM | POA: Diagnosis not present

## 2019-06-11 DIAGNOSIS — I10 Essential (primary) hypertension: Secondary | ICD-10-CM | POA: Diagnosis not present

## 2019-06-11 HISTORY — DX: Malignant (primary) neoplasm, unspecified: C80.1

## 2019-06-17 ENCOUNTER — Ambulatory Visit (INDEPENDENT_AMBULATORY_CARE_PROVIDER_SITE_OTHER): Payer: Medicare Other

## 2019-06-17 ENCOUNTER — Other Ambulatory Visit: Payer: Self-pay

## 2019-06-17 VITALS — Ht 66.0 in | Wt 210.0 lb

## 2019-06-17 DIAGNOSIS — Z0000 Encounter for general adult medical examination without abnormal findings: Secondary | ICD-10-CM | POA: Diagnosis not present

## 2019-06-17 NOTE — Progress Notes (Addendum)
Subjective:   Jason Rhodes is a 69 y.o. male who presents for Medicare Annual/Subsequent preventive examination.  The patient consented to a virtual visit.   Review of Systems: Defer to PCP.   Cardiac Risk Factors include: advanced age (>4mn, >>67women);obesity (BMI >30kg/m2);diabetes mellitus;hypertension;male gender  Objective:    Vitals: Ht '5\' 6"'$  (1.676 m)   Wt 210 lb (95.3 kg)   BMI 33.89 kg/m   Body mass index is 33.89 kg/m.  Advanced Directives 06/17/2019 01/30/2017 12/29/2016 05/23/2016 11/11/2015 06/21/2015 04/08/2015  Does Patient Have a Medical Advance Directive? No No No No No No No  Would patient like information on creating a medical advance directive? Yes (MAU/Ambulatory/Procedural Areas - Information given) No - Patient declined No - Patient declined No - Patient declined No - patient declined information No - patient declined information No - patient declined information   Tobacco Social History   Tobacco Use  Smoking Status Former Smoker  . Packs/day: 1.00  . Years: 5.00  . Pack years: 5.00  . Types: Cigarettes  . Quit date: 171 . Years since quitting: 41.2  Smokeless Tobacco Never Used     Counseling given: no plans to restart.  Clinical Intake:  Pre-visit preparation completed: Yes  How often do you need to have someone help you when you read instructions, pamphlets, or other written materials from your doctor or pharmacy?: 1 - Never What is the last grade level you completed in school?: Masters  Interpreter Needed?: No  Past Medical History:  [place]  . [place]   Prostate  . [place] complication [place]   . Hyperlipidemia   . Hypertension    Past Surgical History:  Procedure Laterality Date  . HERNIA REPAIR    . PROSTATE SURGERY    . SHOULDER SURGERY     Family History  Problem Relation Age of Onset  . Diabetes Mother   . Hypertension Mother   . Diabetes Father   . Hypertension Father   .  Diabetes Brother   . Diabetes Brother    Social History   Socioeconomic History  . Marital status: Married    Spouse name: BPamala Hurry  . Number of children: 3  . Years of education: 156 . Highest education level: Master's degree (e.g., MA, MS, MEng, MEd, MSW, MBA)  Occupational History  . Occupation: Retired  Tobacco Use  . Smoking status: Former Smoker    Packs/day: 1.00    Years: 5.00    Pack years: 5.00    Types: Cigarettes    Quit date: 1980    Years since quitting: 41.2  . Smokeless tobacco: Never Used  Substance and Sexual Activity  . Alcohol use: No  . Drug use: No  . Sexual activity: Yes  Other Topics Concern  . Not on file  Social History Narrative   Patient lives [place] his [place], [place]amala [place]    Patient enjoys working out with his wife at [employer]   Patient enjoys spending time with his family, his 3 children.    Social Determinants of Health   Financial Resource Strain: Low Risk   . Difficulty of Paying Living Expenses: Not hard at all  Food Insecurity: No Food Insecurity  . Worried About RCharity fundraiserin the Last Year: Never true  . Ran Out of Food in the Last Year: Never true  Transportation Needs: No Transportation Needs  . Lack of Transportation (Medical): No  . Lack  of Transportation (Non-Medical): No  Physical Activity: Sufficiently Active  . Days of Exercise per Week: 5 days  . Minutes of Exercise per Session: 60 min  Stress: No Stress Concern Present  . Feeling of Stress : Only a little  Social Connections: Slightly Isolated  . Frequency of Communication with Friends and Family: Twice a week  . Frequency of Social Gatherings with Friends and Family: Twice a week  . Attends Religious Services: More than 4 times per year  . Active Member of Clubs or Organizations: No  . Attends Archivist Meetings: Never  . Marital Status: Married   Outpatient Encounter Medications as of 06/17/2019  Medication Sig  . amLODipine  (NORVASC) 10 MG tablet Take 1 tablet (10 mg total) by mouth daily.  Marland Kitchen atorvastatin (LIPITOR) 40 MG tablet Take 1 tablet by mouth once daily  . Blood Glucose Monitoring Suppl (ONE TOUCH ULTRA 2) W/DEVICE KIT Check sugars at least 3 times daily or as directed by provider  . gabapentin (NEURONTIN) 100 MG capsule Take 1 capsule (100 mg total) by mouth 3 (three) times daily.  Marland Kitchen glucose blood test strip Use One test strip to check glucose level 3 times daily.  ICD-10 code: E11.9.  Use One Touch Ultra Blue Test Strips.  Marland Kitchen lisinopril-hydrochlorothiazide (ZESTORETIC) 10-12.5 MG tablet Take 1 tablet by mouth once daily  . metFORMIN (GLUCOPHAGE) 500 MG tablet Take 1 tablet (500 mg total) by mouth 2 (two) times daily.  . naproxen (NAPROSYN) 500 MG tablet TAKE 1 TABLET BY MOUTH 2 TIMES DAILY AS NEEDED.  Marland Kitchen ONETOUCH DELICA LANCETS 36O MISC Use to test blood glucose 3 times daily. ICD-10 code: E11.9.  . Multiple Vitamin (MULTIVITAMIN WITH MINERALS) TABS tablet Take 1 tablet by mouth daily.   No facility-administered encounter medications on file as of 06/17/2019.   Activities of Daily Living In your present state of health, do you have any difficulty performing the following activities: 06/17/2019  Hearing? N  Vision? N  Difficulty concentrating or making decisions? N  Walking or climbing stairs? N  Dressing or bathing? N  Doing errands, shopping? N  Preparing Food and eating ? N  Using the Toilet? N  In the past six months, have you accidently leaked urine? N  Do you have problems with loss of bowel control? N  Managing your Medications? N  Managing your Finances? N  Housekeeping or managing your Housekeeping? N  Some recent data might be hidden   Patient Care Team: Gladys Damme, MD as PCP - General   Theresia Majors- Urology [place]  Assessment:   This is a routine wellness examination for Jason Rhodes.  Exercise Activities and Dietary  recommendations Current Exercise Habits: Structured exercise class, Type of exercise: strength training/weights;treadmill;walking, Time (Minutes): 60, Frequency (Times/Week): 5, Weekly Exercise (Minutes/Week): 300  Goals    . HEMOGLOBIN A1C < 7     Maintain A1c around 5.5.  A1c 5.5 04/03/2019      Fall Risk Fall Risk  06/17/2019 05/23/2016 11/11/2015 03/09/2015 02/23/2015  Falls in the past year? 0 No No No No   Is the patient's home free of loose throw rugs in walkways, pet beds, electrical cords, etc?   yes      Grab bars in the bathroom? yes      Handrails on the stairs?   yes      Adequate lighting?   yes  Patient rating of health (0-10): 6- *due to  newly diagnosed prostate cancer*  Depression Screen PHQ 2/9 Scores 06/17/2019 07/24/2017 01/30/2017 12/29/2016  PHQ - 2 Score 0 0 0 0   Cognitive Function  6CIT Screen 06/17/2019  What Year? 0 points  What month? 0 points  What time? 0 points  Count back from 20 0 points  Months in reverse 2 points  Repeat phrase 0 points  Total Score 2   Immunization History  Administered Date(s) Administered  . Influenza,inj,Quad PF,6+ Mos 01/08/2013, 02/13/2014, 12/07/2014, 12/29/2016  . Influenza-Unspecified 12/16/2015  . Pneumococcal Conjugate-13 06/09/2015  . Tdap 01/08/2013  . Zoster 06/18/2015   Screening Tests Health Maintenance  Topic Date Due  . COLONOSCOPY  Never done  . FOOT EXAM  05/27/2016  . PNA vac Low Risk Adult (2 of 2 - PPSV23) 06/08/2016  . INFLUENZA VACCINE  10/26/2018  . HEMOGLOBIN A1C  10/01/2019  . OPHTHALMOLOGY EXAM  04/06/2020  . TETANUS/TDAP  01/09/2023  . Hepatitis C Screening  Completed   Cancer Screenings: Lung: Low Dose CT Chest recommended if Age 39-80 years, 30 pack-year currently smoking OR have quit w/in 15years. Patient does not qualify. Colorectal: Due 2021  Additional Screenings: Hepatitis C Screening: Completed  HIV Screening: Completed   Plan:  I am sorry about your newly diagnosed  prostate cancer, I will update your PCP. Fill out an advance directive. Keep up the good work exercising 5x a week! This will help lower and maintain your A1c.  Apt with PCP scheduled for April 5th '@10'$ :15am.   I have personally reviewed and noted the following in the patient's chart:   . Medical and social history . Use of alcohol, tobacco or illicit drugs  . Current medications and supplements . Functional ability and status . Nutritional status . Physical activity . Advanced directives . List of other physicians . Hospitalizations, surgeries, and ER visits in previous 12 months . Vitals . Screenings to include cognitive, depression, and falls . Referrals and appointments  In addition, I have reviewed and discussed with patient certain preventive protocols, quality metrics, and best practice recommendations. A written personalized care plan for preventive services as well as general preventive health recommendations were provided to patient.  This visit was conducted virtually in the setting of the [place] pandemic.   Dorna Bloom, [place]  06/17/2019   I have reviewed this visit and agree with the documentation.

## 2019-06-17 NOTE — Patient Instructions (Addendum)
You spoke to Jason Rhodes, [place] over the phone for your annual wellness visit.  We discussed goals: Goals    . HEMOGLOBIN A1C < 7     Maintain A1c around 5.5.  A1c 5.5 04/03/2019      We also discussed recommended health maintenance.  As discussed, you are due for the following. You have an apt with PCP in April, health maintenance can be addressed then.   Health Maintenance  Topic Date Due  . COLONOSCOPY  Never done  . FOOT EXAM  05/27/2016  . PNA vac Low Risk Adult (2 of 2 - PPSV23) 06/08/2016  . INFLUENZA VACCINE  10/26/2018  . HEMOGLOBIN A1C  10/01/2019  . OPHTHALMOLOGY EXAM  04/06/2020  . TETANUS/TDAP  01/09/2023  . Hepatitis C Screening  Completed   I am sorry about your newly diagnosed prostate cancer, I will update your PCP. Fill out an advance directive. Keep up the good work exercising 5x a week! This will help lower and maintain your A1c.  Apt with PCP scheduled for April 5th '@10'$ :15am.   Preventive Care 29 Years and Older, Male Preventive care refers to lifestyle choices and visits with your health care provider that can promote health and wellness. This includes:  A yearly physical exam. This is also called an annual well check.  Regular dental and eye exams.  Immunizations.  Screening for certain conditions.  Healthy lifestyle choices, such as diet and exercise. What can I expect for my preventive care visit? Physical exam Your health care provider will check:  Height and weight. These may be used to calculate body mass index (BMI), which is a measurement that tells if you are at a healthy weight.  Heart rate and blood pressure.  Your skin for abnormal spots. Counseling Your health care provider may ask you questions about:  Alcohol, tobacco, and drug use.  Emotional well-being.  Home and relationship well-being.  Sexual activity.  Eating habits.  History of falls.  Memory and ability to understand (cognition).  Work and work  Statistician. What immunizations do I need?  Influenza (flu) vaccine  This is recommended every year. Tetanus, diphtheria, and pertussis (Tdap) vaccine  You may need a Td booster every 10 years. Varicella (chickenpox) vaccine  You may need this vaccine if you have not already been vaccinated. Zoster (shingles) vaccine  You may need this after age 50. Pneumococcal conjugate (PCV13) vaccine  One dose is recommended after age 6. Pneumococcal polysaccharide (PPSV23) vaccine  One dose is recommended after age 32. Measles, mumps, and rubella (MMR) vaccine  You may need at least one dose of MMR if you were born in 1957 or later. You may also need a second dose. Meningococcal conjugate (MenACWY) vaccine  You may need this if you have certain conditions. Hepatitis A vaccine  You may need this if you have certain conditions or if you travel or work in places where you may be exposed to hepatitis A. Hepatitis B vaccine  You may need this if you have certain conditions or if you travel or work in places where you may be exposed to hepatitis B. Haemophilus influenzae type b (Hib) vaccine  You may need this if you have certain conditions. You may receive vaccines as individual doses or as more than one vaccine together in one shot (combination vaccines). Talk with your health care provider about the risks and benefits of combination vaccines. What tests do I need? Blood tests  Lipid and cholesterol levels. These may  be checked every 5 years, or more frequently depending on your overall health.  Hepatitis C test.  Hepatitis B test. Screening  Lung cancer screening. You may have this screening every year starting at age 46 if you have a 30-pack-year history of smoking and currently smoke or have quit within the past 15 years.  Colorectal cancer screening. All adults should have this screening starting at age 62 and continuing until age 68. Your health care provider may recommend  screening at age 46 if you are at increased risk. You will have tests every 1-10 years, depending on your results and the type of screening test.  Prostate cancer screening. Recommendations will vary depending on your family history and other risks.  Diabetes screening. This is done by checking your blood sugar (glucose) after you have not eaten for a while (fasting). You may have this done every 1-3 years.  Abdominal aortic aneurysm (AAA) screening. You may need this if you are a current or former smoker.  Sexually transmitted disease (STD) testing. Follow these instructions at home: Eating and drinking  Eat a diet that includes fresh fruits and vegetables, whole grains, lean protein, and low-fat dairy products. Limit your intake of foods with high amounts of sugar, saturated fats, and salt.  Take vitamin and mineral supplements as recommended by your health care provider.  Do not drink alcohol if your health care provider tells you not to drink.  If you drink alcohol: ? Limit how much you have to 0-2 drinks a day. ? Be aware of how much alcohol is in your drink. In the [place] one drink equals one 12 oz bottle of beer (355 mL), one 5 oz glass of wine (148 mL), or one 1 oz glass of hard liquor (44 mL). Lifestyle  Take daily care of your teeth and gums.  Stay active. Exercise for at least 30 minutes on 5 or more days each week.  Do not use any products that contain nicotine or tobacco, such as cigarettes, e-cigarettes, and chewing tobacco. If you need help quitting, ask your health care provider.  If you are sexually active, practice safe sex. Use a condom or other form of protection to prevent STIs (sexually transmitted infections).  Talk with your health care provider about taking a low-dose aspirin or statin. What's next?  Visit your health care provider once a year for a well check visit.  Ask your health care provider how often you should have your eyes and teeth  checked.  Stay up to date on all vaccines. This information is not intended to replace advice given to you by your health care provider. Make sure you discuss any questions you have with your health care provider. Document Revised: 03/07/2018 Document Reviewed: 03/07/2018 Elsevier Patient Education  2020 [place] [facility]'s number is [phone]. Please call with questions or concerns about what we discussed today.

## 2019-06-30 ENCOUNTER — Ambulatory Visit (INDEPENDENT_AMBULATORY_CARE_PROVIDER_SITE_OTHER): Payer: Medicare Other | Admitting: Family Medicine

## 2019-06-30 ENCOUNTER — Other Ambulatory Visit: Payer: Self-pay

## 2019-06-30 ENCOUNTER — Encounter: Payer: Self-pay | Admitting: Family Medicine

## 2019-06-30 VITALS — BP 114/60 | HR 72 | Ht 66.0 in | Wt 204.2 lb

## 2019-06-30 DIAGNOSIS — E119 Type 2 diabetes mellitus without complications: Secondary | ICD-10-CM

## 2019-06-30 DIAGNOSIS — Z0000 Encounter for general adult medical examination without abnormal findings: Secondary | ICD-10-CM | POA: Diagnosis not present

## 2019-06-30 DIAGNOSIS — C61 Malignant neoplasm of prostate: Secondary | ICD-10-CM | POA: Diagnosis not present

## 2019-06-30 DIAGNOSIS — I10 Essential (primary) hypertension: Secondary | ICD-10-CM | POA: Diagnosis not present

## 2019-06-30 LAB — POCT GLYCOSYLATED HEMOGLOBIN (HGB A1C): HbA1c, POC (controlled diabetic range): 5.5 % (ref 0.0–7.0)

## 2019-06-30 NOTE — Patient Instructions (Signed)
It was a pleasure to meet you!  1. Please follow up in June after your appointment with Dr Idolina Primer and we will do pneumonia vaccine then  2. Your A1c is too good! Decrease metformin to once a day.  3.Please follow up with Dr. Earlean Shawl and get your colonoscopy this year.  4. Call Dr. Trish Fountain office re: Covid vaccine  Be Well!  Dr. Chauncey Reading

## 2019-07-01 DIAGNOSIS — C61 Malignant neoplasm of prostate: Secondary | ICD-10-CM

## 2019-07-01 HISTORY — DX: Malignant neoplasm of prostate: C61

## 2019-07-01 MED ORDER — METFORMIN HCL 500 MG PO TABS: 500.0000 mg | ORAL_TABLET | Freq: Every day | ORAL | 2 refills | Status: DC

## 2019-07-01 NOTE — Assessment & Plan Note (Addendum)
Patient had question regarding the COVID-19 vaccine and his recent cancer diagnosis and whether he should get the vaccine. As the patient is not currently undergoing any treatment and has an appointment with the oncologist in June 2021, I do not see any contraindications to vaccination at this time, especially as patient has other comorbidities such as DM and obesity that would put him at greater risk if he were to develop COVID-19. Recommended that patient call office of oncologist to ask if they have a specific recommendation I am not aware of, but otherwise I recommend he get vaccinated this month if possible.  Will hold off on pneumococcal vaccination at this time in order to get COVID-19 vaccine as soon as possible. Plan to administer pneumococcal vaccines at next visit pending recommendations from oncology.  Patient's last colonoscopy with Dr. Earlean Shawl at [facility] in 2017, due for repeat colonoscopy this year due to numerous adenomatous polyps. Recommended he schedule an appointment with Dr. Earlean Shawl for a colonoscopy.

## 2019-07-01 NOTE — Assessment & Plan Note (Signed)
Hgb A1c 5.5 today, same as last check in January. Below goal. -Decrease metformin to 500 mg qAM -Recheck at physical in June/July

## 2019-07-01 NOTE — Progress Notes (Signed)
SUBJECTIVE:   CHIEF COMPLAINT / HPI:   Jason Rhodes presents today with his wife wishing to discuss recent prostate cancer diagnosis. Patient s/p repeat TURP on 05/05/19 for h/o BPH and continued symptoms of dysuria, dribbling, frequency. Reports TURP procedure in 2014 for similar symptoms as well as elevated PSA to ~8, no evidence of cancer at that time. Per Urology note, biopsy found "minute focus of G3+3 prostate cancer." Dr. Veronda Rhodes believes patient is a good candidate for active surveillance and he has been referred to Dr. Armando Rhodes, Oncology, at [facility]. He has an appointment scheduled for June. Per Dr. Sheral Rhodes note, PSA is likely to be falsely elevated after recent surgery and suggests waiting until appointment with Dr. Idolina Rhodes to check PSA. Patient and his wife confirm they have a lab appointment in June prior to seeing Dr. Idolina Rhodes. Mostly patient and his wife are concerned about prostate cancer and wonder if anything else should be done. Also have question if he should get the COVID vaccine prior to seeing Dr. Idolina Rhodes or not. Of note, patient has no dysuria and improved stream since TURP.  Additionally patient here for A1c after recent diagnosis of DMT2 in the last year.  PERTINENT  PMH / PSH: DM, prostate cancer  OBJECTIVE:   BP 114/60   Pulse 72   Ht 5\' 6"  (1.676 m)   Wt 204 lb 4 oz (92.6 kg)   SpO2 99%   BMI 32.97 kg/m   Physical Exam Vitals and nursing note reviewed.  Constitutional:      General: He is not in acute distress.    Appearance: Normal appearance. He is obese. He is not ill-appearing or toxic-appearing.  HENT:     Head: Normocephalic and atraumatic.     Nose: Nose normal.     Mouth/Throat:     Mouth: Mucous membranes are moist.     Pharynx: Oropharynx is clear.  Eyes:     Conjunctiva/sclera: Conjunctivae normal.     Pupils: Pupils are equal, round, and reactive to light.  Cardiovascular:     Rate and Rhythm: Normal rate and regular rhythm.     Pulses: Normal  pulses.     Heart sounds: Normal heart sounds. No murmur. No friction rub. No gallop.   Pulmonary:     Effort: Pulmonary effort is normal. No respiratory distress.     Breath sounds: Normal breath sounds. No wheezing, rhonchi or rales.  Abdominal:     General: Abdomen is flat. Bowel sounds are normal. There is no distension.     Palpations: Abdomen is soft.     Tenderness: There is no abdominal tenderness.  Skin:    General: Skin is warm and dry.     Capillary Refill: Capillary refill takes less than 2 seconds.  Neurological:     General: No focal deficit present.     Mental Status: He is alert and oriented to person, place, and time. Mental status is at baseline.  Psychiatric:        Mood and Affect: Mood normal.        Behavior: Behavior normal.    Diabetic Foot Exam - Simple   Simple Foot Form Diabetic Foot exam was performed with the following findings: Yes 06/30/2019 10:30 AM  Visual Inspection No deformities, no ulcerations, no other skin breakdown bilaterally: Yes Sensation Testing Intact to touch and monofilament testing bilaterally: Yes Pulse Check Posterior Tibialis and Dorsalis pulse intact bilaterally: Yes Comments Normal foot exam, dry feet  ASSESSMENT/PLAN:  Jason Rhodes is a 69 yo man presenting with questions about prostate cancer follow up, COVID-19 vaccine, and A1c check.  Essential hypertension BP today 114/60, below goal. Patient without symptoms of dizziness. No change today. Medications include amlodipine 10 mg, lisinopril-HCTZ 10/12.5 mg daily.  Follow up in 2 months for annual exam after visit with oncologist.  New onset type 2 diabetes mellitus ([place]) Hgb A1c 5.5 today, same as last check in January. Below goal. -Decrease metformin to 500 mg qAM -Recheck at physical in June/July  Prostate cancer [facility]) Overall note from Dr. Veronda Rhodes of [facility] very reassuring, relatively "low risk" G3+3. Patient already has appropriate follow up with  oncology. Will coordinate with those offices as needed. Reassurance given to patient and wife.  Health care maintenance Patient had question regarding the COVID-19 vaccine and his recent cancer diagnosis and whether he should get the vaccine. As the patient is not currently undergoing any treatment and has an appointment with the oncologist in June 2021, I do not see any contraindications to vaccination at this time, especially as patient has other comorbidities such as DM and obesity that would put him at greater risk if he were to develop COVID-19. Recommended that patient call office of oncologist to ask if they have a specific recommendation I am not aware of, but otherwise I recommend he get vaccinated this month if possible.  Will hold off on pneumococcal vaccination at this time in order to get COVID-19 vaccine as soon as possible. Plan to administer pneumococcal vaccines at next visit pending recommendations from oncology.  Patient's last colonoscopy with Dr. Earlean Shawl at [facility] in 2017, due for repeat colonoscopy this year due to numerous adenomatous polyps. Recommended he schedule an appointment with Dr. Earlean Shawl for a colonoscopy.   Jason Damme, MD [place]

## 2019-07-01 NOTE — Assessment & Plan Note (Signed)
BP today 114/60, below goal. Patient without symptoms of dizziness. No change today. Medications include amlodipine 10 mg, lisinopril-HCTZ 10/12.5 mg daily.  Follow up in 2 months for annual exam after visit with oncologist.

## 2019-07-01 NOTE — Assessment & Plan Note (Addendum)
Overall note from Dr. Veronda Prude of [facility] very reassuring, relatively "low risk" G3+3. Patient already has appropriate follow up with oncology. Will coordinate with those offices as needed. Reassurance given to patient and wife.

## 2019-07-14 ENCOUNTER — Other Ambulatory Visit: Payer: Self-pay | Admitting: Family Medicine

## 2019-07-14 DIAGNOSIS — I10 Essential (primary) hypertension: Secondary | ICD-10-CM

## 2019-09-18 ENCOUNTER — Ambulatory Visit (HOSPITAL_COMMUNITY)
Admission: EM | Admit: 2019-09-18 | Discharge: 2019-09-18 | Disposition: A | Discharge status: Home / Self Care | Payer: Medicare Other | Attending: Physician Assistant | Admitting: Physician Assistant

## 2019-09-18 ENCOUNTER — Other Ambulatory Visit: Payer: Self-pay

## 2019-09-18 ENCOUNTER — Encounter (HOSPITAL_COMMUNITY): Payer: Self-pay

## 2019-09-18 DIAGNOSIS — I452 Bifascicular block: Secondary | ICD-10-CM | POA: Insufficient documentation

## 2019-09-18 DIAGNOSIS — Z20822 Contact with and (suspected) exposure to covid-19: Secondary | ICD-10-CM | POA: Insufficient documentation

## 2019-09-18 DIAGNOSIS — Z1152 Encounter for screening for COVID-19: Secondary | ICD-10-CM | POA: Diagnosis present

## 2019-09-18 LAB — SARS CORONAVIRUS 2 (TAT 6-24 HRS): SARS Coronavirus 2: NEGATIVE

## 2019-09-18 NOTE — Discharge Instructions (Addendum)
Your EKG did show some changes int he conduction of your heart, this is ok to monitor until you have follow up as long as you are not having symptoms.  If you feel light headed, have chest pain or shortness of breath, you should go to the Emergency Department  Call you Primary care tomorrow to have close follow up, if it is going to be more than 1-2 weeks, call the cardiology group I have supplied

## 2019-09-18 NOTE — ED Provider Notes (Signed)
[place]    CSN: [id number] Arrival date & time: 09/18/19  1449      History   Chief Complaint Chief Complaint  Patient presents with  . Covid Exposure    HPI Jason Rhodes is a 69 y.o. male.   Patient reports for Covid testing.  He had a recent close exposure with his grandson.  Exposure was 5 days ago.  Grandson tested positive recently over the last few days.  He denies any symptoms.  No cough, shortness of breath, headache, sore throat, nausea, vomiting, diarrhea.  Patient reports he drank 2 big cups of coffee prior to coming and felt his heart beating little faster at that time.  He does report that he will get a little lightheaded in the morning after taking his blood pressure medicines after standing up quickly.  This quickly resolves and does not happen at any point after this.  Denies any chest pain, shortness of breath or lightheaded feeling or dizziness in clinic.     Past Medical History:  Diagnosis Date  . Cancer ([place]) 06/11/2019   Prostate  . Diabetes mellitus without complication ([place])   . Hyperlipidemia   . Hypertension     Patient Active Problem List   Diagnosis Date Noted  . Prostate cancer ([place]) 07/01/2019  . Left shoulder pain 04/03/2019  . Painless rectal bleeding 08/29/2018  . Right knee pain 01/30/2017  . New onset type 2 diabetes mellitus ([place]) 02/23/2015  . Neuropathy, cervical (radicular) 01/20/2015  . OSA (obstructive sleep apnea) 02/13/2014  . Hyperlipidemia 06/19/2013  . Sickle cell trait ([place]) 01/10/2013  . 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 Health care maintenance 12/09/2012  . BPH (benign prostatic hyperplasia) 08/27/2012  . Essential hypertension 08/27/2012    Past Surgical History:  Procedure Laterality Date  . HERNIA REPAIR    . PROSTATE SURGERY    . SHOULDER SURGERY         Home Medications    Prior to Admission medications   Medication Sig Start Date End Date Taking? Authorizing Provider  amLODipine  (NORVASC) 10 MG tablet Take 1 tablet by mouth once daily 07/15/19   Gladys Damme, MD  atorvastatin (LIPITOR) 40 MG tablet Take 1 tablet by mouth once daily 05/29/19   Gladys Damme, MD  Blood Glucose Monitoring Suppl (ONE TOUCH ULTRA 2) W/DEVICE KIT Check sugars at least 3 times daily or as directed by provider 02/23/15   Katheren Shams, DO  gabapentin (NEURONTIN) 100 MG capsule Take 1 capsule (100 mg total) by mouth 3 (three) times daily. 12/29/16   Diallo, Earna Coder, MD  glucose blood test strip Use One test strip to check glucose level 3 times daily.  ICD-10 code: E11.9.  Use One Touch Ultra Blue Test Strips. 09/14/16   Dickie La, MD  lisinopril-hydrochlorothiazide (ZESTORETIC) 10-12.5 MG tablet Take 1 tablet by mouth once daily 04/13/19   Gladys Damme, MD  metFORMIN (GLUCOPHAGE) 500 MG tablet Take 1 tablet (500 mg total) by mouth daily with breakfast. 07/01/19   Gladys Damme, MD  Multiple Vitamin (MULTIVITAMIN WITH MINERALS) TABS tablet Take 1 tablet by mouth daily. 05/24/15   [provider]  naproxen (NAPROSYN) 500 MG tablet TAKE 1 TABLET BY MOUTH 2 TIMES DAILY AS NEEDED. 11/01/18   Gladys Damme, MD  [facility] DELICA LANCETS 18A MISC Use to test blood glucose 3 times daily. ICD-10 code: E11.9. 09/14/16   Dickie La, MD    Family History Family History  Problem Relation Age  of Onset  . Diabetes Mother   . Hypertension Mother   . Diabetes Father   . Hypertension Father   . Diabetes Brother   . Diabetes Brother     Social History Social History   Tobacco Use  . Smoking status: Former Smoker    Packs/day: 1.00    Years: 5.00    Pack years: 5.00    Types: Cigarettes    Quit date: 1980    Years since quitting: 41.5  . Smokeless tobacco: Never Used  Substance Use Topics  . Alcohol use: No  . Drug use: No     Allergies   Patient has no known allergies.   Review of Systems Review of Systems   Physical Exam Triage Vital Signs ED Triage Vitals  Enc  Vitals Group     BP      Pulse      Resp      Temp      Temp src      SpO2      Weight      Height      Head Circumference      Peak Flow      Pain Score      Pain Loc      Pain Edu?      Excl. in GC?    No data found.  Updated Vital Signs BP (!) 142/75 (BP Location: Left Arm)   Pulse (!) 114   Temp 99.6 F (37.6 C) (Oral)   Resp 18   SpO2 98%   Visual Acuity Right Eye Distance:   Left Eye Distance:   Bilateral Distance:    Right Eye Near:   Left Eye Near:    Bilateral Near:     Physical Exam Vitals and nursing note reviewed.  Constitutional:      General: He is not in acute distress.    Appearance: Normal appearance. He is well-developed. He is not ill-appearing.  HENT:     Head: Normocephalic and atraumatic.     Nose: Nose normal.  Eyes:     Conjunctiva/sclera: Conjunctivae normal.  Cardiovascular:     Rate and Rhythm: Normal rate and regular rhythm.     Heart sounds: No murmur heard.      Comments: Patient initially slightly tachycardic, however improvement throughout exam and repeat evaluations. Pulmonary:     Effort: Pulmonary effort is normal. No respiratory distress.     Breath sounds: Normal breath sounds.  Abdominal:     Palpations: Abdomen is soft.     Tenderness: There is no abdominal tenderness.  Musculoskeletal:     Cervical back: Neck supple.  Skin:    General: Skin is warm and dry.  Neurological:     Mental Status: He is alert.      UC Treatments / Results  Labs (all labs ordered are listed, but only abnormal results are displayed) Labs Reviewed  SARS CORONAVIRUS 2 (TAT 6-24 HRS)    EKG Normal sinus rhythm with right bundle blanch block and left anterior fascicular block.  Agree with electronic readout.  Abnormal EKG.  Reviewed with attending physician Dr. Tracie Harrier.  Changes from previous in 2016, no more recent EKGs to compare.  Radiology No results found.  Procedures Procedures (including critical care time)  Medications  Ordered in UC Medications - No data to display  Initial Impression / Assessment and Plan / UC Course  I have reviewed the triage vital signs and the nursing notes.  Pertinent  labs & imaging results that were available during my care of the patient were reviewed by me and considered in my medical decision making (see chart for details).     #Exposure to Covid #Bifascicular block Patient with history of diabetes and prostate cancer presents for Covid testing after Covid exposure.  Asymptomatic from a Covid standpoint.  He does have an abnormal EKG with bifascicular block however patient appears to be completely asymptomatic without chest pain, shortness of breath or lightheaded or dizziness.  Unclear how long this may have been present, 2016 EKG with possible early left anterior fascicular block.  Given he is asymptomatic we will have him outpatient follow-up.  I discussed this with the patient and that he should follow-up closely with his primary care or a cardiologist.  He has an appointment with his primary care on July 2.  Discussed that if he would become lightheaded, have any chest pain or shortness of breath that he should immediately go to the emergency department.  Patient and son verbalized understanding the plan.  Final Clinical Impressions(s) / UC Diagnoses   Final diagnoses:  Bifascicular block  Exposure to COVID-19 virus  Encounter for screening for COVID-19     Discharge Instructions     Your EKG did show some changes int he conduction of your heart, this is ok to monitor until you have follow up as long as you are not having symptoms.  If you feel light headed, have chest pain or shortness of breath, you should go to the Emergency Department  Call you Primary care tomorrow to have close follow up, if it is going to be more than 1-2 weeks, call the cardiology group I have supplied      ED Prescriptions    None     PDMP not reviewed this encounter.   Purnell Shoemaker, PA-C 09/18/19 2036

## 2019-09-18 NOTE — ED Triage Notes (Signed)
Pt presents to UC for COVID testing after exposure 5  days ago. Pt denies any symptoms.

## 2019-09-19 ENCOUNTER — Encounter: Payer: Self-pay | Admitting: Family Medicine

## 2019-09-19 ENCOUNTER — Other Ambulatory Visit: Payer: Self-pay

## 2019-09-19 ENCOUNTER — Telehealth (INDEPENDENT_AMBULATORY_CARE_PROVIDER_SITE_OTHER): Payer: Medicare Other | Admitting: Family Medicine

## 2019-09-19 VITALS — Temp 98.6°F

## 2019-09-19 DIAGNOSIS — Z20822 Contact with and (suspected) exposure to covid-19: Secondary | ICD-10-CM

## 2019-09-19 DIAGNOSIS — I452 Bifascicular block: Secondary | ICD-10-CM | POA: Diagnosis not present

## 2019-09-19 NOTE — Progress Notes (Signed)
[facility] Telemedicine Visit  Patient consented to have virtual visit and was identified by name and date of birth. Method of visit: Video  Encounter participants: Patient: Jason Rhodes - located at home Provider: Benay Pike - located at [facility] Others (if applicable): wife, daughter.   Chief Complaint: covid exposure  HPI:  covid exposure: pt was exposed to covid by grandson on the 20th.  Found out yesterday about the positive test.  The pt tested negative but is having chills and felt lightheaded yesterday.  No diarrhea or fever.    bifasicular block: pt found to have bifascicular block on ekg while at the  no chest pain. Has Occasional racing heart beat that lasts a few minutes.    Prostate cancer. - has appt with oncologist coming up but needs to reschedule due to covid.   ROS: per HPI  Pertinent PMHx: dm2, prostate cancer,   Exam:  Temp 98.6 F (37 C) (Other (Comment)) Comment (Src): forehead  Respiratory: no respiratory distress.  No cough.    Assessment/Plan:  Exposure to COVID-19 virus Exposed to covid by his grandson 5 days ago.  Wife tested positive while he did not but still experiencing symptoms of suspected covid infection.  Will advise pt to treat himself as if he is covid positive and self isolate. Advised him to reschedule upcoming appointment.  Advised him to get retested in approx one week.  If still negative he can get covid vaccine at any time, otherwise if positive he should wait 90 days.   Bifascicular block Newly found on ekg on ed visit recently.  No hx of heart disease. Asymptomatic except for occasional breif episodes of arrhythmias.  Will send in referral to cardiology.     Time spent during visit with patient: 12 minutes

## 2019-09-23 ENCOUNTER — Encounter: Payer: Self-pay | Admitting: Family Medicine

## 2019-09-23 DIAGNOSIS — Z20822 Contact with and (suspected) exposure to covid-19: Secondary | ICD-10-CM | POA: Insufficient documentation

## 2019-09-23 DIAGNOSIS — I452 Bifascicular block: Secondary | ICD-10-CM | POA: Insufficient documentation

## 2019-09-23 HISTORY — DX: Contact with and (suspected) exposure to covid-19: Z20.822

## 2019-09-23 NOTE — Assessment & Plan Note (Signed)
Newly found on ekg on ed visit recently.  No hx of heart disease. Asymptomatic except for occasional breif episodes of arrhythmias.  Will send in referral to cardiology.

## 2019-09-23 NOTE — Assessment & Plan Note (Signed)
Exposed to covid by his grandson 5 days ago.  Wife tested positive while he did not but still experiencing symptoms of suspected covid infection.  Will advise pt to treat himself as if he is covid positive and self isolate. Advised him to reschedule upcoming appointment.  Advised him to get retested in approx one week.  If still negative he can get covid vaccine at any time, otherwise if positive he should wait 90 days.

## 2019-09-24 ENCOUNTER — Telehealth: Payer: Self-pay | Admitting: Infectious Diseases

## 2019-09-24 ENCOUNTER — Other Ambulatory Visit: Payer: Self-pay | Admitting: Infectious Diseases

## 2019-09-24 DIAGNOSIS — I10 Essential (primary) hypertension: Secondary | ICD-10-CM

## 2019-09-24 DIAGNOSIS — C61 Malignant neoplasm of prostate: Secondary | ICD-10-CM

## 2019-09-24 DIAGNOSIS — U071 COVID-19: Secondary | ICD-10-CM

## 2019-09-24 DIAGNOSIS — E119 Type 2 diabetes mellitus without complications: Secondary | ICD-10-CM

## 2019-09-24 MED ORDER — SODIUM CHLORIDE 0.9 % IV SOLN
Freq: Once | INTRAVENOUS | Status: AC
  Filled 2019-09-24: qty 600

## 2019-09-24 NOTE — Progress Notes (Signed)
  I connected by phone with Jason Rhodes on 09/24/2019 at 11:15 AM to discuss the potential use of monoclonal antibody treatment for mild to moderate COVID-19 viral infection in non-hospitalized patients.  This patient is a 69 y.o. male that meets the FDA criteria for Emergency Use Authorization of casirivimab/imdevimab.  Has a (+) direct SARS-CoV-2 viral test result  Has mild or moderate COVID-19   Is NOT hospitalized due to COVID-19  Is within 10 days of symptom onset  Has at least one of the high risk factor(s) for progression to severe COVID-19 and/or hospitalization as defined in EUA.  Specific high risk criteria : Cardiovascular disease or hypertension, Age > 74   I have spoken and communicated the following to the patient or parent/caregiver:  1. FDA has authorized the emergency use of bamlanivimab/etesevimab and casirivimab\imdevimab for the treatment of mild to moderate COVID-19 in adults and pediatric patients with positive results of direct SARS-CoV-2 viral testing who are 42 years of age and older weighing at least 40 kg, and who are at high risk for progressing to severe COVID-19 and/or hospitalization.  2. The significant known and potential risks and benefits of bamlanivimab/etesevimab and casirivimab\imdevimab, and the extent to which such potential risks and benefits are unknown.  3. Information on available alternative treatments and the risks and benefits of those alternatives, including clinical trials.  4. Patients treated with bamlanivimab/etesevimab and casirivimab\imdevimab should continue to self-isolate and use infection control measures (e.g., wear mask, isolate, social distance, avoid sharing personal items, clean and disinfect "high touch" surfaces, and frequent handwashing) according to CDC guidelines.   5. The patient or parent/caregiver has the option to accept or refuse bamlanivimab/etesevimab or casirivimab\imdevimab .  After reviewing this information with  the patient, The patient agreed to proceed with receiving the casirivimab\imdevimab infusion and will be provided a copy of the Fact sheet prior to receiving the infusion.Janene Madeira 09/24/2019 11:15 AM

## 2019-09-24 NOTE — Telephone Encounter (Signed)
Called to discuss with patient about Covid symptoms and the use of Regeneron, a monoclonal antibody infusion for those with mild to moderate Covid symptoms and at a high risk of hospitalization.  Pt is qualified for this infusion at the [facility] infusion center due to Age > 35.    I spoke with his wife and he has some coughing off and on that is described to be dry. His symptoms started Saturday 6/26 (day 4).   Appt scheduled for Regeneron tomorrow 7/01 @ 8:30 am. Information discussed:

## 2019-09-25 ENCOUNTER — Ambulatory Visit (HOSPITAL_COMMUNITY)
Admission: RE | Admit: 2019-09-25 | Discharge: 2019-09-25 | Disposition: A | Discharge status: Home / Self Care | Payer: Medicare Other | Source: Ambulatory Visit | Attending: Pulmonary Disease | Admitting: Pulmonary Disease

## 2019-09-25 DIAGNOSIS — C61 Malignant neoplasm of prostate: Secondary | ICD-10-CM | POA: Diagnosis not present

## 2019-09-25 DIAGNOSIS — Z23 Encounter for immunization: Secondary | ICD-10-CM | POA: Insufficient documentation

## 2019-09-25 DIAGNOSIS — U071 COVID-19: Secondary | ICD-10-CM | POA: Diagnosis not present

## 2019-09-25 DIAGNOSIS — I10 Essential (primary) hypertension: Secondary | ICD-10-CM | POA: Diagnosis not present

## 2019-09-25 DIAGNOSIS — E119 Type 2 diabetes mellitus without complications: Secondary | ICD-10-CM | POA: Insufficient documentation

## 2019-09-25 MED ORDER — EPINEPHRINE 0.3 MG/0.3ML IJ SOAJ: 0.3000 mg | Freq: Once | INTRAMUSCULAR | Status: DC | PRN

## 2019-09-25 MED ORDER — SODIUM CHLORIDE 0.9 % IV SOLN: INTRAVENOUS | Status: DC | PRN

## 2019-09-25 MED ORDER — ALBUTEROL SULFATE HFA 108 (90 BASE) MCG/ACT IN AERS: 2.0000 | INHALATION_SPRAY | Freq: Once | RESPIRATORY_TRACT | Status: DC | PRN

## 2019-09-25 MED ORDER — FAMOTIDINE IN NACL 20-0.9 MG/50ML-% IV SOLN: 20.0000 mg | Freq: Once | INTRAVENOUS | Status: DC | PRN

## 2019-09-25 MED ORDER — DIPHENHYDRAMINE HCL 50 MG/ML IJ SOLN: 50.0000 mg | Freq: Once | INTRAMUSCULAR | Status: DC | PRN

## 2019-09-25 MED ORDER — METHYLPREDNISOLONE SODIUM SUCC 125 MG IJ SOLR: 125.0000 mg | Freq: Once | INTRAMUSCULAR | Status: DC | PRN

## 2019-09-25 NOTE — Discharge Instructions (Signed)

## 2019-09-25 NOTE — Progress Notes (Signed)
  Diagnosis: COVID-19  Physician:DR Joya Gaskins  Procedure: Covid Infusion Clinic Med: casirivimab\imdevimab infusion - Provided patient with casirivimab\imdevimab fact sheet for patients, parents and caregivers prior to infusion.  Complications: No immediate complications noted.  Discharge: Discharged home   Jason Rhodes 09/25/2019

## 2019-09-26 ENCOUNTER — Encounter: Payer: Self-pay | Admitting: Family Medicine

## 2019-09-26 ENCOUNTER — Telehealth (INDEPENDENT_AMBULATORY_CARE_PROVIDER_SITE_OTHER): Payer: Medicare Other | Admitting: Family Medicine

## 2019-09-26 ENCOUNTER — Other Ambulatory Visit: Payer: Self-pay

## 2019-09-26 DIAGNOSIS — U071 COVID-19: Secondary | ICD-10-CM

## 2019-09-26 DIAGNOSIS — I452 Bifascicular block: Secondary | ICD-10-CM | POA: Diagnosis not present

## 2019-09-27 ENCOUNTER — Encounter: Payer: Self-pay | Admitting: Family Medicine

## 2019-09-27 DIAGNOSIS — U071 COVID-19: Secondary | ICD-10-CM | POA: Insufficient documentation

## 2019-09-27 HISTORY — DX: COVID-19: U07.1

## 2019-09-27 NOTE — Assessment & Plan Note (Signed)
Currently asymptomatic. Incidentally found last week on ekg. Gave patient strict precautions to go to ED (CP, SOB, dizziness, syncope). Most bifasicular blocks are incidental, asymptomatic findings. He has an appropriate referral to Cardiology, will reassure him that 8/20 is still an ok date for this problem, it's not too far away.

## 2019-09-27 NOTE — Progress Notes (Signed)
[place] Telemedicine Visit  Patient consented to have virtual visit and was identified by name and date of birth. Method of visit: Video  Encounter participants: Patient: Jason Rhodes - located at home Provider: Gladys Damme - located at [facility] Other: wife  Chief Complaint: COVID-19 symptoms  HPI: Patient was diagnosed with COVID-19 after his wife, daughters, and grand son were positive in 6/20. He initially tested negative, but began having symptoms last weekend. He reports that he has myalgias, cough, fevers, malaise, and feels generally terrible. He is able to eat and drink without nausea or vomiting. Denies diarrhea. He denies CP, SOB, or chest tightness. Per his request, I had referred him to the outpatient COVID-19 infusion center. He had an infusion last week and reports feeling like it helped.  Last week he had an EKG that showed bifasicular block. Unclear how long this had been present. Patient has been asymptomatic: no CP, SOB, dizziness, syncope. He was referred to cardiology and has an appointment for 11/14/19. He is concerned that this may be too far out.   He had to reschedule an appt with his oncologist for prostate cancer; it is now on 10/22/19.  ROS: per HPI  Pertinent PMHx: DMT2, prostate ca, COVID-19 infection, bifasicular block  Exam:  BP 106/70   Temp 99 F (37.2 C) (Oral)   Ht 5\' 6"  (1.676 m)   Wt 240 lb (108.9 kg)   BMI 38.74 kg/m   Respiratory: no distress, able to speak in full sentences, dry cough  Assessment/Plan:  COVID-19 virus infection Patient has had mild symptoms for 1 week. He is in no respiratory distress, able to hydrate appropriately. Gave him strict precautions to go to ED: if having CP, SOB, feeling faint or like he can't catch his breath, unable to take fluids by mouth in a 24 hour period, significant vomiting or diarrhea that prevents eating/drinking. Patient should continue to heal at home. Gave him precautions to  quarantine at home for at least 10 days and only to release from quarantine if on day 10 he has not had any fevers or required anti-pyretic medication in the last 24 hours. He asked if he could use theraflu. I recommended that he not use it due to his heart (bifasicular block) since it contains phenylephrine. He may use tylenol safely.  Bifascicular block Currently asymptomatic. Incidentally found last week on ekg. Gave patient strict precautions to go to ED (CP, SOB, dizziness, syncope). Most bifasicular blocks are incidental, asymptomatic findings. He has an appropriate referral to Cardiology, will reassure him that 8/20 is still an ok date for this problem, it's not too far away.    Time spent during visit with patient: 16 minutes

## 2019-09-27 NOTE — Assessment & Plan Note (Signed)
Patient has had mild symptoms for 1 week. He is in no respiratory distress, able to hydrate appropriately. Gave him strict precautions to go to ED: if having CP, SOB, feeling faint or like he can't catch his breath, unable to take fluids by mouth in a 24 hour period, significant vomiting or diarrhea that prevents eating/drinking. Patient should continue to heal at home. Gave him precautions to quarantine at home for at least 10 days and only to release from quarantine if on day 10 he has not had any fevers or required anti-pyretic medication in the last 24 hours. He asked if he could use theraflu. I recommended that he not use it due to his heart (bifasicular block) since it contains phenylephrine. He may use tylenol safely.

## 2019-09-29 ENCOUNTER — Encounter: Payer: Self-pay | Admitting: Family Medicine

## 2019-10-09 ENCOUNTER — Other Ambulatory Visit: Payer: Self-pay | Admitting: Family Medicine

## 2019-10-09 DIAGNOSIS — I10 Essential (primary) hypertension: Secondary | ICD-10-CM

## 2019-10-22 DIAGNOSIS — C61 Malignant neoplasm of prostate: Secondary | ICD-10-CM | POA: Diagnosis not present

## 2019-10-22 DIAGNOSIS — R102 Pelvic and perineal pain: Secondary | ICD-10-CM | POA: Diagnosis not present

## 2019-11-12 DIAGNOSIS — N400 Enlarged prostate without lower urinary tract symptoms: Secondary | ICD-10-CM | POA: Diagnosis not present

## 2019-11-12 DIAGNOSIS — Z9079 Acquired absence of other genital organ(s): Secondary | ICD-10-CM | POA: Diagnosis not present

## 2019-11-12 DIAGNOSIS — M818 Other osteoporosis without current pathological fracture: Secondary | ICD-10-CM | POA: Diagnosis not present

## 2019-11-12 DIAGNOSIS — R9389 Abnormal findings on diagnostic imaging of other specified body structures: Secondary | ICD-10-CM | POA: Diagnosis not present

## 2019-11-12 DIAGNOSIS — C61 Malignant neoplasm of prostate: Secondary | ICD-10-CM | POA: Diagnosis not present

## 2019-11-12 DIAGNOSIS — N429 Disorder of prostate, unspecified: Secondary | ICD-10-CM | POA: Diagnosis not present

## 2019-11-13 NOTE — Progress Notes (Signed)
Cardiology Office Note:    Date:  11/14/2019   ID:  Jason Rhodes, DOB 1950/03/30, MRN [id number]  PCP:  Gladys Damme, MD  Cardiologist:  No primary care provider on file.   Referring MD: Lind Covert, *   Chief Complaint  Patient presents with  . Shortness of Breath  . Chest Pain  . Advice Only    Bifascicular block/right bundle with left anterior hemiblock.    History of Present Illness:    Jason Rhodes is a 69 y.o. male with a hx of to bifascicular block referred for cardiology consultation by Talbert Cage, [place] - 19 infection.  He suffered COVID-19 in June.  The entire family got sick.  He was seen in the hospital to receive an infusion.  An EKG was performed and demonstrated bifascicular block with a right bundle left anterior hemiblock pattern.  He is referred because of that.  He is concerned he may have "blockages" and getting ready to have a heart attack.  He denies orthopnea and PND but has dyspnea on exertion even prior to Covid.  He is not able to mow his grass as well as he once could without starting to give out.  He has to stop and rest before he can complete the task.  There is no associated pain tightness or pressure.  No associated arrhythmia.  He denies any history of tachycardia.  He is not a smoker.  No family history of CAD.  He has been diabetic for greater than 2 years, hypertensive for greater than 20 years, and has hyperlipidemia.  Past Medical History:  Diagnosis Date  . Bifascicular block   . BPH (benign prostatic hyperplasia) 08/27/2012  . Cancer ([place]) 06/11/2019   Prostate  . COVID-19 virus infection 09/27/2019  . Diabetes mellitus without complication ([place])   . Dizziness   . Essential hypertension 08/27/2012  . Exposure to COVID-19 virus 09/23/2019  . Hyperlipidemia   . Left shoulder pain 04/03/2019  . Neuropathy, cervical (radicular) 01/20/2015  . New onset type 2 diabetes mellitus ([place]) 02/23/2015  . OSA (obstructive sleep  apnea) 02/13/2014   Sleep study 05/2014 - severe OSA; on CPAP   . Painless rectal bleeding 08/29/2018  . 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 Prostate cancer ([place]) 07/01/2019   Relatively low risk per Urology, "minute focus" G3+3 s/p TURP x2 2014, 2021. Active surveillance with oncology at [facility], Dr. Idolina Primer. Most recent PSAs ~3.  . Right knee pain 01/30/2017  . Sickle cell trait ([place]) 01/10/2013   Per patient report     Past Surgical History:  Procedure Laterality Date  . HERNIA REPAIR    . PROSTATE SURGERY    . SHOULDER SURGERY      Current Medications: Current Meds  Medication Sig  . amLODipine (NORVASC) 10 MG tablet Take 1 tablet by mouth once daily  . atorvastatin (LIPITOR) 40 MG tablet Take 1 tablet by mouth once daily  . Blood Glucose Monitoring Suppl (ONE TOUCH ULTRA 2) W/DEVICE KIT Check sugars at least 3 times daily or as directed by provider  . gabapentin (NEURONTIN) 100 MG capsule Take 1 capsule (100 mg total) by mouth 3 (three) times daily.  Marland Kitchen glucose blood test strip Use One test strip to check glucose level 3 times daily.  ICD-10 code: E11.9.  Use One Touch Ultra Blue Test Strips.  Marland Kitchen lisinopril-hydrochlorothiazide (  ZESTORETIC) 10-12.5 MG tablet Take 1 tablet by mouth once daily  . metFORMIN (GLUCOPHAGE) 500 MG tablet Take 1 tablet (500 mg total) by mouth daily with breakfast.  . naproxen (NAPROSYN) 500 MG tablet TAKE 1 TABLET BY MOUTH 2 TIMES DAILY AS NEEDED.  Marland Kitchen ONETOUCH DELICA LANCETS 23J MISC Use to test blood glucose 3 times daily. ICD-10 code: E11.9.     Allergies:   Patient has no known allergies.   Social History   Socioeconomic History  . Marital status: Married    Spouse name: Pamala Hurry   . Number of children: 3  . Years of education: 84  . Highest education level: Master's degree (e.g., MA, MS, MEng, MEd, MSW, MBA)  Occupational History  . Occupation: Retired  Tobacco Use  .  Smoking status: Former Smoker    Packs/day: 1.00    Years: 5.00    Pack years: 5.00    Types: Cigarettes    Quit date: 1980    Years since quitting: 41.6  . Smokeless tobacco: Never Used  Substance and Sexual Activity  . Alcohol use: No  . Drug use: No  . Sexual activity: Yes  Other Topics Concern  . Not on file  Social History Narrative   Patient lives with his wife, Pamala Hurry, in [place].    Patient enjoys working out with his wife at [employer].   Patient enjoys spending time with his family, his 3 children.    Social Determinants of Health   Financial Resource Strain: Low Risk   . Difficulty of Paying Living Expenses: Not hard at all  Food Insecurity: No Food Insecurity  . Worried About Charity fundraiser in the Last Year: Never true  . Ran Out of Food in the Last Year: Never true  Transportation Needs: No Transportation Needs  . Lack of Transportation (Medical): No  . Lack of Transportation (Non-Medical): No  Physical Activity: Sufficiently Active  . Days of Exercise per Week: 5 days  . Minutes of Exercise per Session: 60 min  Stress: No Stress Concern Present  . Feeling of Stress : Only a little  Social Connections: Moderately Integrated  . Frequency of Communication with Friends and Family: Twice a week  . Frequency of Social Gatherings with Friends and Family: Twice a week  . Attends Religious Services: More than 4 times per year  . Active Member of Clubs or Organizations: No  . Attends Archivist Meetings: Never  . Marital Status: Married     Family History: The patient's family history includes Diabetes in his brother, brother, father, and mother; Hypertension in his father and mother.  ROS:   Please see the history of present illness.    He denies orthopnea and PND.  He has anxiety concerning his self.  His medication regimen is tolerated without difficulty.  All other systems reviewed and are negative.  EKGs/Labs/Other Studies Reviewed:    The  following studies were reviewed today: No cardiac evaluation has been done in the past.  EKG:  EKG performed on September 18, 2019 demonstrates right bundle, left axis deviation, and sinus rhythm at 98 bpm.  When compared to September 2016, right bundle branch block is new.  Recent Labs: No results found for requested labs within last 8760 hours.  Recent Lipid Panel    Component Value Date/Time   CHOL 114 08/29/2018 1446   TRIG 84 08/29/2018 1446   HDL 43 08/29/2018 1446   CHOLHDL 2.7 08/29/2018 1446   CHOLHDL 3.0  02/13/2014 1534   VLDL 11 02/13/2014 1534   LDLCALC 54 08/29/2018 1446    Physical Exam:    VS:  BP 118/68   Pulse 84   Ht _0  (1.676 m)   Wt 199 lb 9.6 oz (90.5 kg)   SpO2 99%   BMI 32.22 kg/m     Wt Readings from Last 3 Encounters:  11/14/19 199 lb 9.6 oz (90.5 kg)  09/26/19 240 lb (108.9 kg)  06/30/19 204 lb 4 oz (92.6 kg)     GEN: Obesity. No acute distress HEENT: Normal NECK: No JVD. LYMPHATICS: No lymphadenopathy CARDIAC:  RRR without murmur, gallop, or edema. VASCULAR:  Normal Pulses. No bruits. RESPIRATORY:  Clear to auscultation without rales, wheezing or rhonchi  ABDOMEN: Soft, non-tender, non-distended, No pulsatile mass, MUSCULOSKELETAL: No deformity  SKIN: Warm and dry NEUROLOGIC:  Alert and oriented x 3 PSYCHIATRIC:  Normal affect   ASSESSMENT:    1. Bifascicular block   2. Essential hypertension   3. Hyperlipidemia, unspecified hyperlipidemia type   4. Chest pain of uncertain etiology   5. SOB (shortness of breath) on exertion   6. Educated about COVID-19 virus infection    PLAN:    In order of problems listed above:  1. We discussed the physiology behind bifascicular block and contrasted it against blood vessel blockage and consequences related to that.  No action is required.  He has progressed from left anterior hemiblock to right bundle branch block with left anterior hemiblock over the last 5 years.  We will check an  echocardiogram to ensure no evidence of myocardial dysfunction/structural abnormality post Covid. 2. Blood pressure control is excellent.  Target 130/80.  Continue Norvasc and Zestoretic. 3. Target LDL less than 70 given his diabetic status.  Most recent LDL was 54 which is excellent.  Continue Lipitor 40 mg/day. 4. Has atypical features.  It occurs predominantly at rest.  He comes from the lower left chest and radiates across the chest into the axillary area and into his back.  He can last for seconds.  It is not precipitated by activity. 5. Exertional shortness of breath has limited his ability to mow his grass now for greater than 6 months, predating COVID-19 infection.  Rule out ischemic equivalent given his risk factors.  Coronary CTA with FFR if indicated. 6. He has been stricken by COVID-19.  He will receive vaccination when appropriate.     Medication Adjustments/Labs and Tests Ordered: Current medicines are reviewed at length with the patient today.  Concerns regarding medicines are outlined above.  Orders Placed This Encounter  Procedures  . CT CORONARY MORPH W/CTA COR W/SCORE W/CA W/CM &/OR WO/CM  . CT CORONARY FRACTIONAL FLOW RESERVE DATA PREP  . CT CORONARY FRACTIONAL FLOW RESERVE FLUID ANALYSIS  . Basic metabolic panel  . ECHOCARDIOGRAM COMPLETE   Meds ordered this encounter  Medications  . metoprolol tartrate (LOPRESSOR) 100 MG tablet    Sig: Take one tablet by mouth 2 hours prior to your CT    Dispense:  1 tablet    Refill:  0    Patient Instructions  Medication Instructions:  Your physician recommends that you continue on your current medications as directed. Please refer to the Current Medication list given to you today.  *If you need a refill on your cardiac medications before your next appointment, please call your pharmacy*   Lab Work: None today  If you have labs (blood work) drawn today and your tests are completely normal,  you will receive your results  only by: Marland Kitchen MyChart Message (if you have MyChart) OR . A paper copy in the mail If you have any lab test that is abnormal or we need to change your treatment, we will call you to review the results.   Testing/Procedures: Your physician has requested that you have an echocardiogram. Echocardiography is a painless test that uses sound waves to create images of your heart. It provides your doctor with information about the size and shape of your heart and how well your heart's chambers and valves are working. This procedure takes approximately one hour. There are no restrictions for this procedure.  Your physician recommends that you have a Coronary CT performed.    Follow-Up: At [facility], you and your health needs are our priority.  As part of our continuing mission to provide you with exceptional heart care, we have created designated Provider Care Teams.  These Care Teams include your primary Cardiologist (physician) and Advanced Practice Providers (APPs -  Physician Assistants and Nurse Practitioners) who all work together to provide you with the care you need, when you need it.  We recommend signing up for the patient portal called "MyChart".  Sign up information is provided on this After Visit Summary.  MyChart is used to connect with patients for Virtual Visits (Telemedicine).  Patients are able to view lab/test results, encounter notes, upcoming appointments, etc.  Non-urgent messages can be sent to your provider as well.   To learn more about what you can do with MyChart, go to [email or web address].    Your next appointment:   As needed  The format for your next appointment:   In Person  Provider:   You may see Dr. Daneen Schick or one of the following Advanced Practice Providers on your designated Care Team:    Truitt Merle, NP  Cecilie Kicks, NP  Kathyrn Drown, NP    Other Instructions  Your cardiac CT will be scheduled at one of the below locations:   [facility] [address] [place], [place] [address] [phone]  [facility] [address] [place], [place] [address] [phone]  If scheduled at [facility], please arrive at the [facility] main entrance of [facility] 30 minutes prior to test start time. Proceed to the [facility] Radiology Department (first floor) to check-in and test prep.  If scheduled at [facility], please arrive 15 mins early for check-in and test prep.  Please follow these instructions carefully (unless otherwise directed):  Hold all erectile dysfunction medications at least 3 days (72 hrs) prior to test.  On the Night Before the Test: . Be sure to Drink plenty of water. . Do not consume any caffeinated/decaffeinated beverages or chocolate 12 hours prior to your test. . Do not take any antihistamines 12 hours prior to your test.   On the Day of the Test: . Drink plenty of water. Do not drink any water within one hour of the test. . Do not eat any food 4 hours prior to the test. . You may take your regular medications prior to the test.  . Take metoprolol (Lopressor) two hours prior to test. . HOLD Furosemide/Hydrochlorothiazide morning of the test.       After the Test: . Drink plenty of water. . After receiving IV contrast, you may experience a mild flushed feeling. This is normal. . On occasion, you may experience  a mild rash up to 24 hours after the test. This is not dangerous. If this occurs, you can take Benadryl 25 mg and increase your fluid intake. . If you experience trouble breathing, this can be serious. If it is severe call 911 IMMEDIATELY. If it is mild, please call our office. . If you take any of these medications: Glipizide/Metformin, Avandament, Glucavance, please do not take 48 hours after completing test unless otherwise instructed.   Once we have confirmed authorization from your  insurance company, we will call you to set up a date and time for your test. Based on how quickly your insurance processes prior authorizations requests, please allow up to 4 weeks to be contacted for scheduling your Cardiac CT appointment. Be advised that routine Cardiac CT appointments could be scheduled as many as 8 weeks after your provider has ordered it.  For non-scheduling related questions, please contact the cardiac imaging nurse navigator should you have any questions/concerns: Marchia Bond, Cardiac Imaging Nurse Navigator Burley Saver, Interim Cardiac Imaging Nurse [place] [facility] Direct Office Dial: [phone]   For scheduling needs, including cancellations and rescheduling, please call Vivien Rota at [phone], option 3.        Signed, Sinclair Grooms, MD  11/14/2019 5:35 PM    [facility]

## 2019-11-14 ENCOUNTER — Other Ambulatory Visit: Payer: Self-pay

## 2019-11-14 ENCOUNTER — Ambulatory Visit (INDEPENDENT_AMBULATORY_CARE_PROVIDER_SITE_OTHER): Payer: Medicare Other | Admitting: Interventional Cardiology

## 2019-11-14 ENCOUNTER — Encounter: Payer: Self-pay | Admitting: Interventional Cardiology

## 2019-11-14 VITALS — BP 118/68 | HR 84 | Ht 66.0 in | Wt 199.6 lb

## 2019-11-14 DIAGNOSIS — Z7189 Other specified counseling: Secondary | ICD-10-CM | POA: Diagnosis not present

## 2019-11-14 DIAGNOSIS — E785 Hyperlipidemia, unspecified: Secondary | ICD-10-CM

## 2019-11-14 DIAGNOSIS — I452 Bifascicular block: Secondary | ICD-10-CM | POA: Diagnosis not present

## 2019-11-14 DIAGNOSIS — R079 Chest pain, unspecified: Secondary | ICD-10-CM

## 2019-11-14 DIAGNOSIS — R0602 Shortness of breath: Secondary | ICD-10-CM

## 2019-11-14 DIAGNOSIS — I10 Essential (primary) hypertension: Secondary | ICD-10-CM | POA: Diagnosis not present

## 2019-11-14 MED ORDER — METOPROLOL TARTRATE 100 MG PO TABS
ORAL_TABLET | ORAL | 0 refills | Status: DC
Start: 2019-11-14 — End: 2022-01-10

## 2019-11-14 NOTE — Patient Instructions (Addendum)
Medication Instructions:  Your physician recommends that you continue on your current medications as directed. Please refer to the Current Medication list given to you today.  *If you need a refill on your cardiac medications before your next appointment, please call your pharmacy*   Lab Work: None today  If you have labs (blood work) drawn today and your tests are completely normal, you will receive your results only by: Marland Kitchen MyChart Message (if you have MyChart) OR . A paper copy in the mail If you have any lab test that is abnormal or we need to change your treatment, we will call you to review the results.   Testing/Procedures: Your physician has requested that you have an echocardiogram. Echocardiography is a painless test that uses sound waves to create images of your heart. It provides your doctor with information about the size and shape of your heart and how well your heart's chambers and valves are working. This procedure takes approximately one hour. There are no restrictions for this procedure.  Your physician recommends that you have a Coronary CT performed.    Follow-Up: At [facility], you and your health needs are our priority.  As part of our continuing mission to provide you with exceptional heart care, we have created designated Provider Care Teams.  These Care Teams include your primary Cardiologist (physician) and Advanced Practice Providers (APPs -  Physician Assistants and Nurse Practitioners) who all work together to provide you with the care you need, when you need it.  We recommend signing up for the patient portal called "MyChart".  Sign up information is provided on this After Visit Summary.  MyChart is used to connect with patients for Virtual Visits (Telemedicine).  Patients are able to view lab/test results, encounter notes, upcoming appointments, etc.  Non-urgent messages can be sent to your provider as well.   To learn more about what you can do with MyChart,  go to [email or web address].    Your next appointment:   As needed  The format for your next appointment:   In Person  Provider:   You may see Dr. Verdis Prime or one of the following Advanced Practice Providers on your designated Care Team:    Norma Fredrickson, NP  Nada Boozer, NP  Georgie Chard, NP    Other Instructions  Your cardiac CT will be scheduled at one of the below locations:   [facility] [address] [place], [place] [address] [phone]  OR  [facility] [address] [place], [place] [address] [phone]  If scheduled at [facility], please arrive at the [facility] main entrance of [facility] 30 minutes prior to test start time. Proceed to the [facility] Radiology Department (first floor) to check-in and test prep.  If scheduled at [facility], please arrive 15 mins early for check-in and test prep.  Please follow these instructions carefully (unless otherwise directed):  Hold all erectile dysfunction medications at least 3 days (72 hrs) prior to test.  On the Night Before the Test: . Be sure to Drink plenty of water. . Do not consume any caffeinated/decaffeinated beverages or chocolate 12 hours prior to your test. . Do not take any antihistamines 12 hours prior to your test.   On the Day of the Test: . Drink plenty of water. Do not drink any water within one hour of the test. . Do not eat any food 4 hours prior to the  test. . You may take your regular medications prior to the test.  . Take metoprolol (Lopressor) two hours prior to test. . HOLD Furosemide/Hydrochlorothiazide morning of the test.       After the Test: . Drink plenty of water. . After receiving IV contrast, you may experience a mild flushed feeling. This is normal. . On occasion, you may experience a mild rash up to 24 hours after the test. This is not dangerous. If this  occurs, you can take Benadryl 25 mg and increase your fluid intake. . If you experience trouble breathing, this can be serious. If it is severe call 911 IMMEDIATELY. If it is mild, please call our office. . If you take any of these medications: Glipizide/Metformin, Avandament, Glucavance, please do not take 48 hours after completing test unless otherwise instructed.   Once we have confirmed authorization from your insurance company, we will call you to set up a date and time for your test. Based on how quickly your insurance processes prior authorizations requests, please allow up to 4 weeks to be contacted for scheduling your Cardiac CT appointment. Be advised that routine Cardiac CT appointments could be scheduled as many as 8 weeks after your provider has ordered it.  For non-scheduling related questions, please contact the cardiac imaging nurse navigator should you have any questions/concerns: Marchia Bond, Cardiac Imaging Nurse Navigator Burley Saver, Interim Cardiac Imaging Nurse [facility] Direct Office Dial: [phone]   For scheduling needs, including cancellations and rescheduling, please call Vivien Rota at [phone], option 3.

## 2019-11-23 ENCOUNTER — Other Ambulatory Visit: Payer: Self-pay | Admitting: Family Medicine

## 2019-11-23 DIAGNOSIS — E119 Type 2 diabetes mellitus without complications: Secondary | ICD-10-CM

## 2019-11-24 NOTE — Addendum Note (Signed)
Addended by: Loren Racer on: 11/24/2019 08:32 AM   Modules accepted: Orders

## 2019-11-26 ENCOUNTER — Other Ambulatory Visit: Payer: Self-pay

## 2019-11-26 ENCOUNTER — Telehealth (HOSPITAL_COMMUNITY): Payer: Self-pay | Admitting: Emergency Medicine

## 2019-11-26 DIAGNOSIS — E119 Type 2 diabetes mellitus without complications: Secondary | ICD-10-CM

## 2019-11-26 MED ORDER — METFORMIN HCL 500 MG PO TABS: 500.0000 mg | ORAL_TABLET | Freq: Two times a day (BID) | ORAL | 3 refills | Status: DC

## 2019-11-26 NOTE — Telephone Encounter (Signed)
Patients wife calling nurse line to check status of refill. Please advise.

## 2019-11-26 NOTE — Telephone Encounter (Signed)
Reaching out to patient to offer assistance regarding upcoming cardiac imaging study; pt verbalizes understanding of appt date/time, parking situation and where to check in, pre-test NPO status and medications ordered, and verified current allergies; name and call back number provided for further questions should they arise Zaul Hubers RN Navigator Cardiac Imaging [facility] [phone] office [phone] cell 

## 2019-11-27 ENCOUNTER — Ambulatory Visit (HOSPITAL_COMMUNITY): Payer: Medicare Other | Attending: Cardiovascular Disease

## 2019-11-27 ENCOUNTER — Other Ambulatory Visit: Payer: Self-pay

## 2019-11-27 ENCOUNTER — Other Ambulatory Visit: Payer: Medicare Other

## 2019-11-27 DIAGNOSIS — R0602 Shortness of breath: Secondary | ICD-10-CM | POA: Insufficient documentation

## 2019-11-27 DIAGNOSIS — I452 Bifascicular block: Secondary | ICD-10-CM

## 2019-11-27 LAB — BASIC METABOLIC PANEL
BUN/Creatinine Ratio: 11 (ref 10–24)
BUN: 16 mg/dL (ref 8–27)
CO2: 24 mmol/L (ref 20–29)
Calcium: 10.1 mg/dL (ref 8.6–10.2)
Chloride: 100 mmol/L (ref 96–106)
Creatinine, Ser: 1.42 mg/dL — ABNORMAL HIGH (ref 0.76–1.27)
GFR calc Af Amer: 58 mL/min/{1.73_m2} — ABNORMAL LOW (ref >59)
GFR calc non Af Amer: 50 mL/min/{1.73_m2} — ABNORMAL LOW (ref >59)
Glucose: 117 mg/dL — ABNORMAL HIGH (ref 65–99)
Potassium: 4.4 mmol/L (ref 3.5–5.2)
Sodium: 139 mmol/L (ref 134–144)

## 2019-11-27 LAB — ECHOCARDIOGRAM COMPLETE
Area-P 1/2: 2.95 cm2
S' Lateral: 2 cm

## 2019-11-28 ENCOUNTER — Ambulatory Visit (HOSPITAL_COMMUNITY)
Admission: RE | Admit: 2019-11-28 | Discharge: 2019-11-28 | Disposition: A | Discharge status: Home / Self Care | Payer: Medicare Other | Source: Ambulatory Visit | Attending: Interventional Cardiology | Admitting: Interventional Cardiology

## 2019-11-28 DIAGNOSIS — I452 Bifascicular block: Secondary | ICD-10-CM | POA: Diagnosis not present

## 2019-11-28 DIAGNOSIS — R0602 Shortness of breath: Secondary | ICD-10-CM | POA: Diagnosis not present

## 2019-11-28 DIAGNOSIS — R079 Chest pain, unspecified: Secondary | ICD-10-CM

## 2019-11-28 IMAGING — CT CT HEART MORP W/ CTA COR W/ SCORE W/ CA W/CM &/OR W/O CM
4 of 7 series · 8 of 20 positions shown, 9 images · IV contrast (APPLIED)
Comparison: None.
COMPARISON: None.

Addendum:
EXAM:
OVER-READ INTERPRETATION  CT CHEST

The following report is an over-read performed by radiologist Dr.
Angi Billiot [REDACTED] on 11/28/2019. This
over-read does not include interpretation of cardiac or coronary
anatomy or pathology. The coronary calcium score/coronary CTA
interpretation by the cardiologist is attached.
CLINICAL DATA: 68-year-old male with h/o hypertension, abnormal ECG
and atypical chest pain.
Cardiac/Coronary  CTA
TECHNIQUE: The patient was scanned on a Phillips Force scanner.

[Series 6: best diast 73 % · axial · 0.39mm/px · z∈[+1032,+1068]mm · 2 of 278 slices shown]
[im 93/278  vessel]
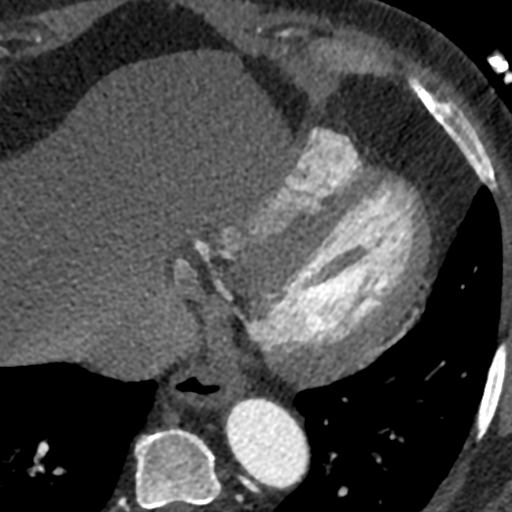
[im 185/278  vessel]
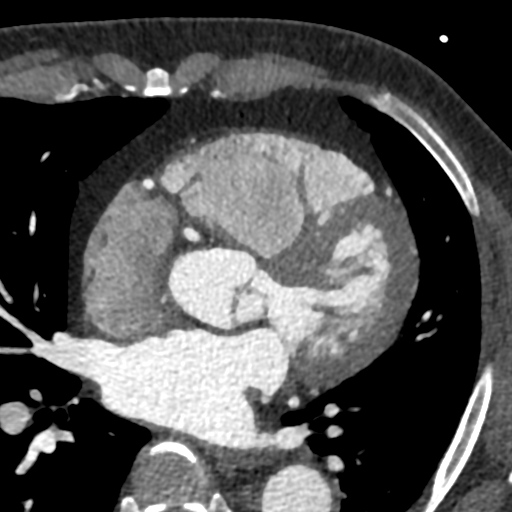

[Series 7: best syst · axial · 0.39mm/px · z∈[+1032,+1068]mm · 2 of 278 slices shown, 3 images]
[im 93/278  vessel]
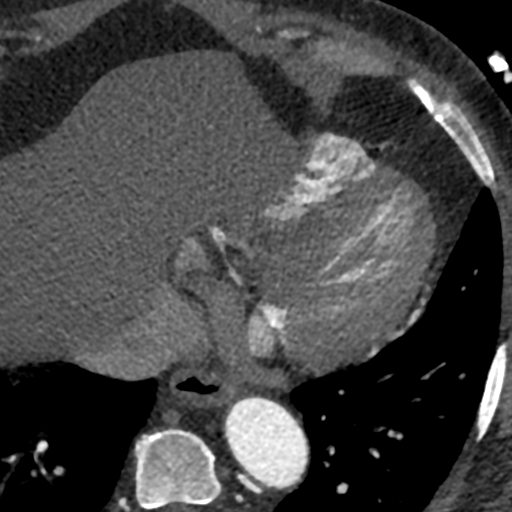
[im 93/278  lung]
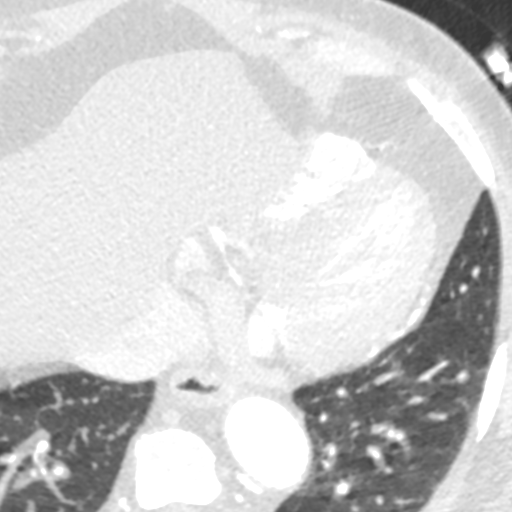
[im 185/278  vessel]
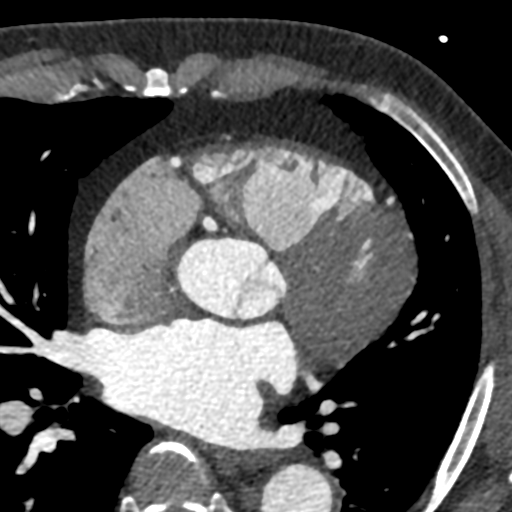

[Series 9: ts diast sharp 73 % · axial · 0.39mm/px · z∈[+1032,+1068]mm · 2 of 278 slices shown]
[im 93/278  lung]
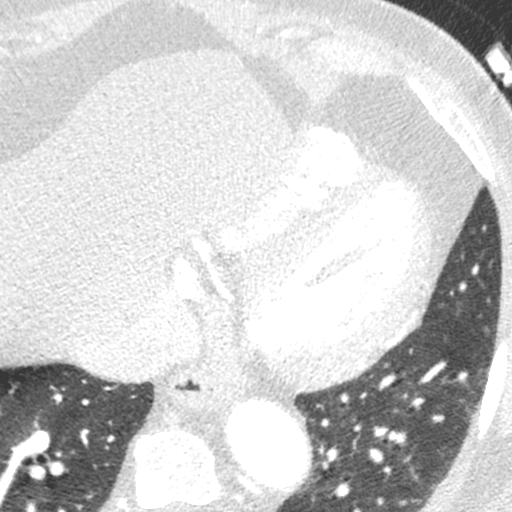
[im 185/278  lung]
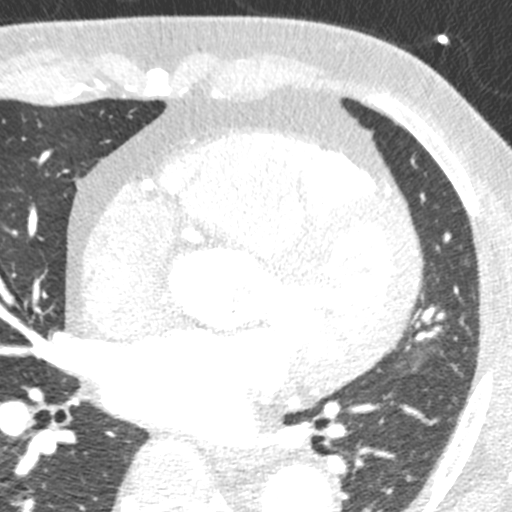

[Series 10: ts syst sharp · axial · 0.39mm/px · z∈[+1032,+1068]mm · 2 of 278 slices shown]
[im 93/278  lung]
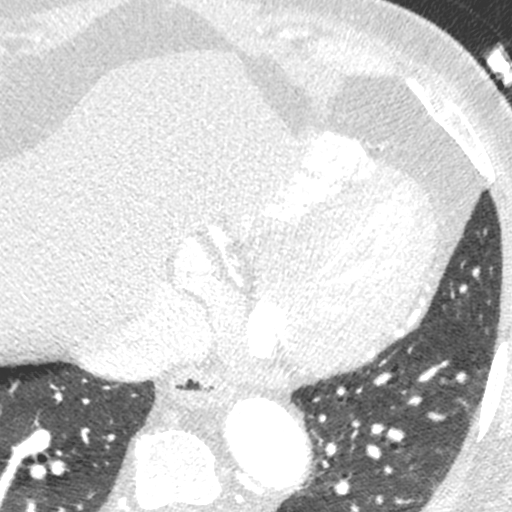
[im 185/278  lung]
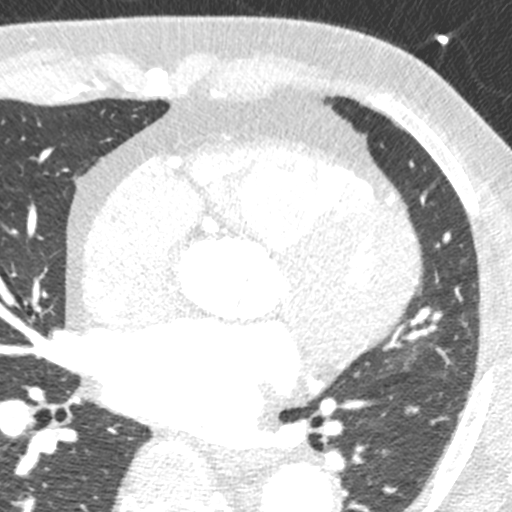

[8 of 20 positions shown; findings below may reference images not displayed]

FINDINGS: Within the visualized portions of the thorax there are no suspicious
appearing pulmonary nodules or masses, there is no acute
consolidative airspace disease, no pleural effusions, no
pneumothorax and no lymphadenopathy. Visualized portions of the
upper abdomen are unremarkable. There are no aggressive appearing
lytic or blastic lesions noted in the visualized portions of the
skeleton.
IMPRESSION: 1. No significant incidental noncardiac findings are noted.
FINDINGS: A 100 kV prospective scan was triggered in the descending thoracic
aorta at 111 HU's. Axial non-contrast 3 mm slices were carried out
through the heart. The data set was analyzed on a dedicated work
station and scored using the Agatson method. Gantry rotation speed
was 250 msecs and collimation was .6 mm. 100 mg of PO Metoprolol and
0.8 mg of sl NTG was given. The 3D data set was reconstructed in 5%
intervals of the 67-82 % of the R-R cycle. Diastolic phases were
analyzed on a dedicated work station using MPR, MIP and VRT modes.
The patient received 80 cc of contrast.

Aorta: Normal size. Trivial atherosclerotic plaque, no
calcifications. No dissection.

Aortic Valve:  Trileaflet.  No calcifications.

Coronary Arteries:  Normal coronary origin.  Right dominance.

RCA is a large dominant artery that gives rise to PDA and PLA. There
is no plaque.

Left main is a large artery that gives rise to LAD and LCX arteries.
Left main has minimal eccentric calcified plaque in the distal
portion with stenosis 0-25%.

LAD is a large vessel that gives rise to one diagonal artery and has
minimal calcified plaque in the proximal portion with stenosis
0-25%.

LCX is a non-dominant artery that gives rise to one large OM1
branch. There is minimal calcified plaque in the proximal portion
with stenosis 0-25%.

Other findings:

Normal pulmonary vein drainage into the left atrium.

Normal left atrial appendage without a thrombus.

Normal size of the pulmonary artery.
IMPRESSION: 1. Coronary calcium score of 22. This was 48 percentile for age and
sex matched control.

2. Normal coronary origin with right dominance.

3. CAD-RADS 1. Minimal non-obstructive CAD (0-24%) in the distal
left main and proximal portions of LAD and LCX arteries. Consider
non-atherosclerotic causes of chest pain. Consider preventive
therapy and risk factor modification.

*** End of Addendum ***
EXAM:
OVER-READ INTERPRETATION  CT CHEST

The following report is an over-read performed by radiologist Dr.
Angi Billiot [REDACTED] on 11/28/2019. This
over-read does not include interpretation of cardiac or coronary
anatomy or pathology. The coronary calcium score/coronary CTA
interpretation by the cardiologist is attached.
FINDINGS: Within the visualized portions of the thorax there are no suspicious
appearing pulmonary nodules or masses, there is no acute
consolidative airspace disease, no pleural effusions, no
pneumothorax and no lymphadenopathy. Visualized portions of the
upper abdomen are unremarkable. There are no aggressive appearing
lytic or blastic lesions noted in the visualized portions of the
skeleton.
IMPRESSION: 1. No significant incidental noncardiac findings are noted.

## 2019-11-28 MED ORDER — NITROGLYCERIN 0.4 MG SL SUBL
0.8000 mg | SUBLINGUAL_TABLET | Freq: Once | SUBLINGUAL | Status: AC
  Administered 2019-11-28: 0.8 mg via SUBLINGUAL

## 2019-11-28 MED ORDER — IOHEXOL 350 MG/ML SOLN
80.0000 mL | Freq: Once | INTRAVENOUS | Status: AC | PRN
  Administered 2019-11-28: 80 mL via INTRAVENOUS

## 2019-11-28 MED ORDER — NITROGLYCERIN 0.4 MG SL SUBL
SUBLINGUAL_TABLET | SUBLINGUAL | Status: AC
  Filled 2019-11-28: qty 2

## 2019-11-28 MED ORDER — METOPROLOL TARTRATE 5 MG/5ML IV SOLN
INTRAVENOUS | Status: AC
  Filled 2019-11-28: qty 5

## 2019-12-03 ENCOUNTER — Encounter: Payer: Self-pay | Admitting: Family Medicine

## 2019-12-16 DIAGNOSIS — N4231 Prostatic intraepithelial neoplasia: Secondary | ICD-10-CM | POA: Diagnosis not present

## 2019-12-16 DIAGNOSIS — C61 Malignant neoplasm of prostate: Secondary | ICD-10-CM | POA: Diagnosis not present

## 2019-12-16 DIAGNOSIS — Z8546 Personal history of malignant neoplasm of prostate: Secondary | ICD-10-CM | POA: Diagnosis not present

## 2019-12-25 DIAGNOSIS — C61 Malignant neoplasm of prostate: Secondary | ICD-10-CM | POA: Diagnosis not present

## 2019-12-29 ENCOUNTER — Ambulatory Visit: Payer: Medicare Other | Admitting: Family Medicine

## 2020-01-05 ENCOUNTER — Ambulatory Visit: Payer: Medicare Other | Admitting: Family Medicine

## 2020-01-06 ENCOUNTER — Ambulatory Visit (INDEPENDENT_AMBULATORY_CARE_PROVIDER_SITE_OTHER): Payer: Medicare Other | Admitting: Family Medicine

## 2020-01-06 ENCOUNTER — Other Ambulatory Visit: Payer: Self-pay

## 2020-01-06 VITALS — BP 110/80 | HR 89 | Ht 66.0 in | Wt 203.2 lb

## 2020-01-06 DIAGNOSIS — E119 Type 2 diabetes mellitus without complications: Secondary | ICD-10-CM | POA: Diagnosis not present

## 2020-01-06 DIAGNOSIS — M109 Gout, unspecified: Secondary | ICD-10-CM

## 2020-01-06 DIAGNOSIS — M1A9XX0 Chronic gout, unspecified, without tophus (tophi): Secondary | ICD-10-CM | POA: Insufficient documentation

## 2020-01-06 LAB — POCT GLYCOSYLATED HEMOGLOBIN (HGB A1C): HbA1c, POC (controlled diabetic range): 6.3 % (ref 0.0–7.0)

## 2020-01-06 NOTE — Assessment & Plan Note (Signed)
History and physical exam are consistent with an acute gout flare. Reports he has never had a gout flareup previously. We briefly discussed the pathophysiology of gout and how to avoid gout flares with appropriate nutrition. He was counseled to avoid naproxen due to his reported medical history of coronary "blockages" on further review of his chart, it appears that he does not notable CAD though he does have a bifascicular block. This is significant because he may benefit from NSAID use in the future. At the visit, he was encouraged to avoid NSAIDs and move forward with Tylenol. He was also told to call back if he did experience worsening symptoms because we could begin colchicine. We did not start colchicine or steroids at the visit because it was far out from the initiation of the flare. -Follow-up uric acid level to consider allopurinol in the future -Tylenol for now -Consider colchicine or NSAIDs if this discomfort rebounds

## 2020-01-06 NOTE — Patient Instructions (Addendum)
Gout flare: Based on her history and exam, you have had a gout flare.  This is due to the buildup of uric acid in your blood and is often related to your diet.  I will give you some information about how to avoid future flares.  I recommend that you stop taking naproxen based on your heart history.  For now, you can continue taking Tylenol for discomfort.  In the future, please let us know immediately when you notice a gout flare because we can give a different medicine to help reduce your symptoms.  We can also try steroids in the future.  At this point, your flare is mostly resolved and I do not think this things would be beneficial to you.  Diabetes: It does look like your diabetes has been pretty well controlled lately.  We are going to look at your A1c today to see if you have reduced Metformin dose is enough to control your diabetes.   Low-Purine Eating Plan A low-purine eating plan involves making food choices to limit your intake of purine. Purine is a kind of uric acid. Too much uric acid in your blood can cause certain conditions, such as gout and kidney stones. Eating a low-purine diet can help control these conditions. What are tips for following this plan? Reading food labels   Avoid foods with saturated or Trans fat.  Check the ingredient list of grains-based foods, such as bread and cereal, to make sure that they contain whole grains.  Check the ingredient list of sauces or soups to make sure they do not contain meat or fish.  When choosing soft drinks, check the ingredient list to make sure they do not contain high-fructose corn syrup. Shopping  Buy plenty of fresh fruits and vegetables.  Avoid buying canned or fresh fish.  Buy dairy products labeled as low-fat or nonfat.  Avoid buying premade or processed foods. These foods are often high in fat, salt (sodium), and added sugar. Cooking  Use olive oil instead of butter when cooking. Oils like olive oil, canola oil, and  sunflower oil contain healthy fats. Meal planning  Learn which foods do or do not affect you. If you find out that a food tends to cause your gout symptoms to flare up, avoid eating that food. You can enjoy foods that do not cause problems. If you have any questions about a food item, talk with your dietitian or health care provider.  Limit foods high in fat, especially saturated fat. Fat makes it harder for your body to get rid of uric acid.  Choose foods that are lower in fat and are lean sources of protein. General guidelines  Limit alcohol intake to no more than 1 drink a day for nonpregnant women and 2 drinks a day for men. One drink equals 12 oz of beer, 5 oz of wine, or 1 oz of hard liquor. Alcohol can affect the way your body gets rid of uric acid.  Drink plenty of water to keep your urine clear or pale yellow. Fluids can help remove uric acid from your body.  If directed by your health care provider, take a vitamin C supplement.  Work with your health care provider and dietitian to develop a plan to achieve or maintain a healthy weight. Losing weight can help reduce uric acid in your blood. What foods are recommended? The items listed may not be a complete list. Talk with your dietitian about what dietary choices are best for you. Foods  low in purines Foods low in purines do not need to be limited. These include:  All fruits.  All low-purine vegetables, pickles, and olives.  Breads, pasta, rice, cornbread, and popcorn. Cake and other baked goods.  All dairy foods.  Eggs, nuts, and nut butters.  Spices and condiments, such as salt, herbs, and vinegar.  Plant oils, butter, and margarine.  Water, sugar-free soft drinks, tea, coffee, and cocoa.  Vegetable-based soups, broths, sauces, and gravies. Foods moderate in purines Foods moderate in purines should be limited to the amounts listed.   cup of asparagus, cauliflower, spinach, mushrooms, or green peas, each  day.  2/3 cup uncooked oatmeal, each day.   cup dry wheat bran or wheat germ, each day.  2-3 ounces of meat or poultry, each day.  4-6 ounces of shellfish, such as crab, lobster, oysters, or shrimp, each day.  1 cup cooked beans, peas, or lentils, each day.  Soup, broths, or bouillon made from meat or fish. Limit these foods as much as possible. What foods are not recommended? The items listed may not be a complete list. Talk with your dietitian about what dietary choices are best for you. Limit your intake of foods high in purines, including:  Beer and other alcohol.  Meat-based gravy or sauce.  Canned or fresh fish, such as: ? Anchovies, sardines, herring, and tuna. ? Mussels and scallops. ? Codfish, trout, and haddock.  Berniece Salines.  Organ meats, such as: ? Liver or kidney. ? Tripe. ? Sweetbreads (thymus gland or pancreas).  Wild Clinical biochemist.  Yeast or yeast extract supplements.  Drinks sweetened with high-fructose corn syrup. Summary  Eating a low-purine diet can help control conditions caused by too much uric acid in the body, such as gout or kidney stones.  Choose low-purine foods, limit alcohol, and limit foods high in fat.  You will learn over time which foods do or do not affect you. If you find out that a food tends to cause your gout symptoms to flare up, avoid eating that food. This information is not intended to replace advice given to you by your health care provider. Make sure you discuss any questions you have with your health care provider. Document Revised: 02/23/2017 Document Reviewed: 04/26/2016 Elsevier Patient Education  [place].  Gout  Gout is a condition that causes painful swelling of the joints. Gout is a type of inflammation of the joints (arthritis). This condition is caused by having too much uric acid in the body. Uric acid is a chemical that forms when the body breaks down substances called purines. Purines are important for  building body proteins. When the body has too much uric acid, sharp crystals can form and build up inside the joints. This causes pain and swelling. Gout attacks can happen quickly and may be very painful (acute gout). Over time, the attacks can affect more joints and become more frequent (chronic gout). Gout can also cause uric acid to build up under the skin and inside the kidneys. What are the causes? This condition is caused by too much uric acid in your blood. This can happen because:  Your kidneys do not remove enough uric acid from your blood. This is the most common cause.  Your body makes too much uric acid. This can happen with some cancers and cancer treatments. It can also occur if your body is breaking down too many red blood cells (hemolytic anemia).  You eat too many foods that are high in  purines. These foods include organ meats and some seafood. Alcohol, especially beer, is also high in purines. A gout attack may be triggered by trauma or stress. What increases the risk? You are more likely to develop this condition if you:  Have a family history of gout.  Are male and middle-aged.  Are male and have gone through menopause.  Are obese.  Frequently drink alcohol, especially beer.  Are dehydrated.  Lose weight too quickly.  Have an organ transplant.  Have lead poisoning.  Take certain medicines, including aspirin, cyclosporine, diuretics, levodopa, and niacin.  Have kidney disease.  Have a skin condition called psoriasis. What are the signs or symptoms? An attack of acute gout happens quickly. It usually occurs in just one joint. The most common place is the big toe. Attacks often start at night. Other joints that may be affected include joints of the feet, ankle, knee, fingers, wrist, or elbow. Symptoms of this condition may include:  Severe pain.  Warmth.  Swelling.  Stiffness.  Tenderness. The affected joint may be very painful to touch.  Shiny,  red, or purple skin.  Chills and fever. Chronic gout may cause symptoms more frequently. More joints may be involved. You may also have white or yellow lumps (tophi) on your hands or feet or in other areas near your joints. How is this diagnosed? This condition is diagnosed based on your symptoms, medical history, and physical exam. You may have tests, such as:  Blood tests to measure uric acid levels.  Removal of joint fluid with a thin needle (aspiration) to look for uric acid crystals.  X-rays to look for joint damage. How is this treated? Treatment for this condition has two phases: treating an acute attack and preventing future attacks. Acute gout treatment may include medicines to reduce pain and swelling, including:  NSAIDs.  Steroids. These are strong anti-inflammatory medicines that can be taken by mouth (orally) or injected into a joint.  Colchicine. This medicine relieves pain and swelling when it is taken soon after an attack. It can be given by mouth or through an IV. Preventive treatment may include:  Daily use of smaller doses of NSAIDs or colchicine.  Use of a medicine that reduces uric acid levels in your blood.  Changes to your diet. You may need to see a dietitian about what to eat and drink to prevent gout. Follow these instructions at home: During a gout attack   If directed, put ice on the affected area: ? Put ice in a plastic bag. ? Place a towel between your skin and the bag. ? Leave the ice on for 20 minutes, 2-3 times a day.  Raise (elevate) the affected joint above the level of your heart as often as possible.  Rest the joint as much as possible. If the affected joint is in your leg, you may be given crutches to use.  Follow instructions from your health care provider about eating or drinking restrictions. Avoiding future gout attacks  Follow a low-purine diet as told by your dietitian or health care provider. Avoid foods and drinks that are high  in purines, including liver, kidney, anchovies, asparagus, herring, mushrooms, mussels, and beer.  Maintain a healthy weight or lose weight if you are overweight. If you want to lose weight, talk with your health care provider. It is important that you do not lose weight too quickly.  Start or maintain an exercise program as told by your health care provider. Eating and drinking  Drink enough fluids to keep your urine pale yellow.  If you drink alcohol: ? Limit how much you use to:  0-1 drink a day for women.  0-2 drinks a day for men. ? Be aware of how much alcohol is in your drink. In the [place] one drink equals one 12 oz bottle of beer (355 mL) one 5 oz glass of wine (148 mL), or one 1 oz glass of hard liquor (44 mL). General instructions  Take over-the-counter and prescription medicines only as told by your health care provider.  Do not drive or use heavy machinery while taking prescription pain medicine.  Return to your normal activities as told by your health care provider. Ask your health care provider what activities are safe for you.  Keep all follow-up visits as told by your health care provider. This is important. Contact a health care provider if you have:  Another gout attack.  Continuing symptoms of a gout attack after 10 days of treatment.  Side effects from your medicines.  Chills or a fever.  Burning pain when you urinate.  Pain in your lower back or belly. Get help right away if you:  Have severe or uncontrolled pain.  Cannot urinate. Summary  Gout is painful swelling of the joints caused by inflammation.  The most common site of pain is the big toe, but it can affect other joints in the body.  Medicines and dietary changes can help to prevent and treat gout attacks. This information is not intended to replace advice given to you by your health care provider. Make sure you discuss any questions you have with your health care provider. Document  Revised: 10/03/2017 Document Reviewed: 10/03/2017 Elsevier Patient Education  [place].

## 2020-01-06 NOTE — Assessment & Plan Note (Signed)
A1c 6.3 mild increase from 5.5 -Continue Metformin 500 mg daily, no need to increase at this time. Still appropriately controlled with an A1c under 7. -Continue atorvastatin -Continue lisinopril -Follow-up BMP, lipid panel

## 2020-01-06 NOTE — Progress Notes (Signed)
    SUBJECTIVE:   CHIEF COMPLAINT / HPI:   Gout Mr. Capri reports that he has been having horrible right toe pain for about the past 3 weeks. He does not remember any specific trauma. About 3 weeks ago, one of the joints in his big toe swelled up terribly and turned very red and was excruciatingly painful. At its worst, he also felt pain in the underside of his foot and the joints of his other toes. At its worst point, I would be painful even to have a bedsheet lie across his toe. He has been away on vacation so he is not able to seek medical care conveniently. Since his pain started, he has been taking naproxen as needed for discomfort. He reports that his pain and swelling seems to improve significantly within the proximal and but often returns again once it wears off.   Diabetes His current diabetes regimen includes: -Metformin 500 mg daily -Atorvastatin 40 mg daily -Lisinopril-HCTZ At his last visit, 6 months ago, he still to decrease his Metformin to 500 mg daily because his blood sugar is well controlled.   PERTINENT  PMH / PSH: Mr. Seago reports a significant cardiac history with some history of "blockages"  OBJECTIVE:   BP 110/80   Pulse 89   Ht 5\' 6"  (1.676 m)   Wt 203 lb 3.2 oz (92.2 kg)   SpO2 98%   BMI 32.80 kg/m    General: Sitting comfortably in his chair in the exam room. No acute distress Respiratory: Breathing comfortably on room air. No respiratory distress Right foot: Inspection: No obvious deformity or abnormality. No significant erythema or swelling. Palpation: Mildly tender to palpation, even soft touch of the MTP. No significant tenderness with palpation of the dorsal and plantar foot. ROM: Good range of motion with digit flexion and extension. No limited range of motion at the ankle.  ASSESSMENT/PLAN:   New onset type 2 diabetes mellitus (HCC) A1c 6.3 mild increase from 5.5 -Continue Metformin 500 mg daily, no need to increase at this time. Still  appropriately controlled with an A1c under 7. -Continue atorvastatin -Continue lisinopril -Follow-up BMP, lipid panel  Gout History and physical exam are consistent with an acute gout flare. Reports he has never had a gout flareup previously. We briefly discussed the pathophysiology of gout and how to avoid gout flares with appropriate nutrition. He was counseled to avoid naproxen due to his reported medical history of coronary "blockages" on further review of his chart, it appears that he does not notable CAD though he does have a bifascicular block. This is significant because he may benefit from NSAID use in the future. At the visit, he was encouraged to avoid NSAIDs and move forward with Tylenol. He was also told to call back if he did experience worsening symptoms because we could begin colchicine. We did not start colchicine or steroids at the visit because it was far out from the initiation of the flare. -Follow-up uric acid level to consider allopurinol in the future -Tylenol for now -Consider colchicine or NSAIDs if this discomfort rebounds     Matilde Haymaker, MD [place]

## 2020-01-07 LAB — LIPID PANEL
Chol/HDL Ratio: 2.9 ratio (ref 0.0–5.0)
Cholesterol, Total: 115 mg/dL (ref 100–199)
HDL: 39 mg/dL — ABNORMAL LOW (ref >39)
LDL Chol Calc (NIH): 59 mg/dL (ref 0–99)
Triglycerides: 88 mg/dL (ref 0–149)
VLDL Cholesterol Cal: 17 mg/dL (ref 5–40)

## 2020-01-07 LAB — BASIC METABOLIC PANEL
BUN/Creatinine Ratio: 6 — ABNORMAL LOW (ref 10–24)
BUN: 8 mg/dL (ref 8–27)
CO2: 24 mmol/L (ref 20–29)
Calcium: 9.6 mg/dL (ref 8.6–10.2)
Chloride: 98 mmol/L (ref 96–106)
Creatinine, Ser: 1.25 mg/dL (ref 0.76–1.27)
GFR calc Af Amer: 67 mL/min/{1.73_m2} (ref >59)
GFR calc non Af Amer: 58 mL/min/{1.73_m2} — ABNORMAL LOW (ref >59)
Glucose: 126 mg/dL — ABNORMAL HIGH (ref 65–99)
Potassium: 3.7 mmol/L (ref 3.5–5.2)
Sodium: 136 mmol/L (ref 134–144)

## 2020-01-07 LAB — URIC ACID: Uric Acid: 9 mg/dL — ABNORMAL HIGH (ref 3.8–8.4)

## 2020-01-09 ENCOUNTER — Other Ambulatory Visit: Payer: Self-pay | Admitting: Family Medicine

## 2020-01-09 MED ORDER — NAPROXEN 500 MG PO TABS: 500.0000 mg | ORAL_TABLET | Freq: Two times a day (BID) | ORAL | 0 refills | Status: DC

## 2020-01-09 NOTE — Progress Notes (Signed)
Called and informed of lab results. He was informed that his uric acid is high which would support starting a medication if he has 3 or more gout flares per year.  He was also told that his kidney function is improved.  He was informed that I have reviewed his cardiac history and there is nothing that precludes him from using naproxen at this time for his gout flares.

## 2020-01-20 DIAGNOSIS — Z1211 Encounter for screening for malignant neoplasm of colon: Secondary | ICD-10-CM | POA: Diagnosis not present

## 2020-01-20 DIAGNOSIS — K635 Polyp of colon: Secondary | ICD-10-CM | POA: Diagnosis not present

## 2020-01-20 DIAGNOSIS — K648 Other hemorrhoids: Secondary | ICD-10-CM | POA: Diagnosis not present

## 2020-01-20 DIAGNOSIS — Z86010 Personal history of colonic polyps: Secondary | ICD-10-CM | POA: Diagnosis not present

## 2020-01-20 DIAGNOSIS — D122 Benign neoplasm of ascending colon: Secondary | ICD-10-CM | POA: Diagnosis not present

## 2020-01-20 DIAGNOSIS — K5730 Diverticulosis of large intestine without perforation or abscess without bleeding: Secondary | ICD-10-CM | POA: Diagnosis not present

## 2020-01-20 DIAGNOSIS — D124 Benign neoplasm of descending colon: Secondary | ICD-10-CM | POA: Diagnosis not present

## 2020-01-30 ENCOUNTER — Other Ambulatory Visit: Payer: Self-pay

## 2020-01-30 ENCOUNTER — Encounter: Payer: Self-pay | Admitting: Family Medicine

## 2020-01-30 ENCOUNTER — Ambulatory Visit (INDEPENDENT_AMBULATORY_CARE_PROVIDER_SITE_OTHER): Payer: Medicare Other | Admitting: Family Medicine

## 2020-01-30 VITALS — BP 138/70 | Ht 66.0 in | Wt 206.0 lb

## 2020-01-30 DIAGNOSIS — E118 Type 2 diabetes mellitus with unspecified complications: Secondary | ICD-10-CM | POA: Diagnosis not present

## 2020-01-30 DIAGNOSIS — U071 COVID-19: Secondary | ICD-10-CM | POA: Diagnosis not present

## 2020-01-30 DIAGNOSIS — Z23 Encounter for immunization: Secondary | ICD-10-CM

## 2020-01-30 DIAGNOSIS — I10 Essential (primary) hypertension: Secondary | ICD-10-CM | POA: Diagnosis not present

## 2020-01-30 DIAGNOSIS — E119 Type 2 diabetes mellitus without complications: Secondary | ICD-10-CM

## 2020-01-30 DIAGNOSIS — Z0000 Encounter for general adult medical examination without abnormal findings: Secondary | ICD-10-CM | POA: Diagnosis not present

## 2020-01-30 NOTE — Assessment & Plan Note (Addendum)
Patient had a colonoscopy on October 26 at [facility], polyp removed.  Awaiting final pathology, and recommendations.  Patient has questions regarding supplements, see list above. Will look into possible interactions with current med list. Will call next week with information.

## 2020-01-30 NOTE — Assessment & Plan Note (Signed)
Hypertension: Patient is presently at goal, no change to treatment - Medications: Amlodipine 10 mg, lisinopril 10 mg, HCTZ 12.5 mg - Compliance: Good - Checking BP at home: Yes, no highs or lows - Denies any SOB, CP, vision changes, LE edema, medication SEs, or symptoms of hypotension - Diet: Standard American diet - Exercise: Plans to start walking again with his wife 3 times a week -We will check metabolic panel today

## 2020-01-30 NOTE — Patient Instructions (Signed)
It was a pleasure to see you today!  1. Your diabetes is well controlled right now. I'm going to check your kidney and liver function as well as look for protein in your urine today. Our next regular follow up will be in April, but you are welcome to come back before that if any other problems pop up.  2. For the supplements, I will have to do more research and I will let you know next week.  3. For the pfizer COVID-19 vaccine, typical side effects are redness, swelling and pain at the site of injection, fever, and fatigue for 2-3 days. It is ok to use tylenol if you have these symptoms. If they persist or you have lots of pain, swelling, or other symptoms, please call our office at [phone]. Your second COVID-19 shot will be in 3 weeks with the nurse. I also recommend that you get the flu shot this winter at your earliest convenience.  4. For your wife, Pamala Hurry, I will look into getting the correct antibody test with our lab manager and let you know when I call you next week with the above results.  Be Well!  Dr. Chauncey Reading

## 2020-01-30 NOTE — Progress Notes (Signed)
SUBJECTIVE:   CHIEF COMPLAINT / HPI: diabetes, htn follow up  DM: patient's last A1c in October 6.3%, no problems with medication. No low BG, no BG above 130s, most recently BG between 100-120 daily. Has not had urine protein checked.  HTN: well controlled today, at goal of <150/90, prsently 138/70. Checks BP at home intermittently, no highs above 150 that he can recall.  COVID-19: patient had COVID-19 in June and received a monoclonal antibody infusion. He is now 4 months out from that treatment and wishes to be vaccinated today. Will give pfizer vaccine, has no h/o reactions to vaccines. Wishes to do one vaccine at a time. Reminded him to get flu and pneumonia 23 at earliest convenience.  Gout: patient had first gout flare 1 month ago, it has since resolved, uric acid at that time was 9. We discussed diet: he eats a lot of shrimp, not much other meat, does not drink alcohol.  Supplements: patient brought a list of supplements that his cousin who is being treated for cancer in the [place] was recommended to use. He wanted advice to see if these supplements would interact with his other medications. Will do more research to check list. Supplements: guanabana (active ingredient meltodexrin), silicon dioxide, strong pure turmeric herbal tea, strong pure neem tea (Indian lilac), Pure soursop (graviola) herbal tea.  Prostate cancer: following with oncology and urology, so far no problems. Next follow up in January 2022.  PERTINENT  PMH / PSH: DMT2, HTN, Prostate cancer  OBJECTIVE:   BP 138/70    Ht 5\' 6"  (1.676 m)    Wt 206 lb (93.4 kg)    BMI 33.25 kg/m   Physical Exam Vitals and nursing note reviewed.  Constitutional:      General: He is not in acute distress.    Appearance: Normal appearance. He is obese. He is not ill-appearing, toxic-appearing or diaphoretic.  HENT:     Head: Normocephalic and atraumatic.     Mouth/Throat:     Mouth: Mucous membranes are moist.  Cardiovascular:      Rate and Rhythm: Normal rate and regular rhythm.     Pulses: Normal pulses.     Heart sounds: Normal heart sounds. No murmur heard.  No friction rub. No gallop.   Pulmonary:     Effort: Pulmonary effort is normal.     Breath sounds: Normal breath sounds.  Musculoskeletal:     Right lower leg: No edema.     Left lower leg: No edema.  Skin:    General: Skin is warm and dry.  Neurological:     Mental Status: He is alert. Mental status is at baseline.  Psychiatric:        Mood and Affect: Mood normal.        Behavior: Behavior normal.    ASSESSMENT/PLAN:   Essential hypertension Hypertension: Patient is presently at goal, no change to treatment - Medications: Amlodipine 10 mg, lisinopril 10 mg, HCTZ 12.5 mg - Compliance: Good - Checking BP at home: Yes, no highs or lows - Denies any SOB, CP, vision changes, LE edema, medication SEs, or symptoms of hypotension - Diet: Standard American diet - Exercise: Plans to start walking again with his wife 3 times a week -We will check metabolic panel today  Health care maintenance Patient had a colonoscopy on October 26 at [facility], polyp removed.  Awaiting final pathology, and recommendations.  Patient has questions regarding supplements, see list above. Will look into possible interactions  with current med list. Will call next week with information.   COVID-19 virus infection Patient has fully recovered from his Covid-19 infection in June.  He received monoclonal antibody at that time.  It has now been for more than 4 months.  He received Pfizer COVID-19 vaccine today, and second shot in 3 weeks.  He tolerated it well  Controlled diabetes mellitus type 2 with complications ([place]) Well controlled with A1c 6.3% in October 2021.  No change therapy at this time. Next A1c check in April 2022. Current Regimen: Metformin 500 mg twice daily CBGs: Checks daily ranges between 90-120 Denies polyuria, polydipsia, hypoglycemia  Last Eye Exam: Due  this year Statin: Atorvastatin 40 mg daily ACE/ARB: Lisinopril 10 daily Will obtain urine microalbumin to assess for kidney disease in setting of diabetes per patient preference.      Gladys Damme, MD [place]

## 2020-01-30 NOTE — Assessment & Plan Note (Signed)
Patient has fully recovered from his Covid-19 infection in June.  He received monoclonal antibody at that time.  It has now been for more than 4 months.  He received Pfizer COVID-19 vaccine today, and second shot in 3 weeks.  He tolerated it well

## 2020-01-30 NOTE — Assessment & Plan Note (Addendum)
Well controlled with A1c 6.3% in October 2021.  No change therapy at this time. Next A1c check in April 2022. Current Regimen: Metformin 500 mg twice daily CBGs: Checks daily ranges between 90-120 Denies polyuria, polydipsia, hypoglycemia  Last Eye Exam: Due this year Statin: Atorvastatin 40 mg daily ACE/ARB: Lisinopril 10 daily Will obtain urine microalbumin to assess for kidney disease in setting of diabetes per patient preference.

## 2020-01-31 LAB — MICROALBUMIN / CREATININE URINE RATIO
Creatinine, Urine: 56.9 mg/dL
Microalb/Creat Ratio: 12 mg/g creat (ref 0–29)
Microalbumin, Urine: 6.7 ug/mL

## 2020-02-27 ENCOUNTER — Other Ambulatory Visit: Payer: Self-pay

## 2020-02-27 ENCOUNTER — Ambulatory Visit (INDEPENDENT_AMBULATORY_CARE_PROVIDER_SITE_OTHER): Payer: Medicare Other | Admitting: *Deleted

## 2020-02-27 DIAGNOSIS — Z23 Encounter for immunization: Secondary | ICD-10-CM | POA: Diagnosis not present

## 2020-02-27 NOTE — Progress Notes (Signed)
Pt given 2nd covid vaccine and tolerated well.  He waited 15 minutes with no reactions. Christen Bame, CMA

## 2020-03-05 ENCOUNTER — Other Ambulatory Visit: Payer: Self-pay | Admitting: Family Medicine

## 2020-03-05 DIAGNOSIS — M10471 Other secondary gout, right ankle and foot: Secondary | ICD-10-CM

## 2020-03-05 MED ORDER — NAPROXEN 500 MG PO TABS: 500.0000 mg | ORAL_TABLET | Freq: Two times a day (BID) | ORAL | 0 refills | Status: DC

## 2020-03-05 MED ORDER — ALLOPURINOL 100 MG PO TABS: 100.0000 mg | ORAL_TABLET | Freq: Every day | ORAL | 0 refills | Status: DC

## 2020-03-05 NOTE — Progress Notes (Signed)
S: Patient presented with wife for her clinic. He notes 2 days of right great toe pain that improved with naproxen 500 mg which he took this AM. Patient has h/o gout. Last uric acid was 9 in November.  O: R foot: 1st toe mildly erythematous, tender to palpation, no breaks in skin or pustulance.  A&P:  - Gout flare: patient to use naproxen 500 mg BID x 4 more days. He has diabetes that is well controlled, eGFR at 60. Recommend after new year he start allopurinol at 100 mg daily. Will increase as needed every 2-4 weeks to achieve uric acid </= 6.  Gladys Damme, MD [facility], PGY-2

## 2020-04-06 DIAGNOSIS — H2513 Age-related nuclear cataract, bilateral: Secondary | ICD-10-CM | POA: Diagnosis not present

## 2020-04-06 DIAGNOSIS — H524 Presbyopia: Secondary | ICD-10-CM | POA: Diagnosis not present

## 2020-04-06 DIAGNOSIS — E119 Type 2 diabetes mellitus without complications: Secondary | ICD-10-CM | POA: Diagnosis not present

## 2020-04-06 DIAGNOSIS — H25013 Cortical age-related cataract, bilateral: Secondary | ICD-10-CM | POA: Diagnosis not present

## 2020-04-08 ENCOUNTER — Other Ambulatory Visit: Payer: Self-pay | Admitting: Family Medicine

## 2020-04-08 DIAGNOSIS — I10 Essential (primary) hypertension: Secondary | ICD-10-CM

## 2020-04-15 DIAGNOSIS — H25811 Combined forms of age-related cataract, right eye: Secondary | ICD-10-CM | POA: Diagnosis not present

## 2020-04-15 DIAGNOSIS — H25011 Cortical age-related cataract, right eye: Secondary | ICD-10-CM | POA: Diagnosis not present

## 2020-04-15 DIAGNOSIS — H2511 Age-related nuclear cataract, right eye: Secondary | ICD-10-CM | POA: Diagnosis not present

## 2020-04-29 DIAGNOSIS — H25812 Combined forms of age-related cataract, left eye: Secondary | ICD-10-CM | POA: Diagnosis not present

## 2020-04-29 DIAGNOSIS — H25012 Cortical age-related cataract, left eye: Secondary | ICD-10-CM | POA: Diagnosis not present

## 2020-04-29 DIAGNOSIS — H2512 Age-related nuclear cataract, left eye: Secondary | ICD-10-CM | POA: Diagnosis not present

## 2020-05-23 DIAGNOSIS — H5711 Ocular pain, right eye: Secondary | ICD-10-CM | POA: Diagnosis not present

## 2020-05-23 DIAGNOSIS — H2000 Unspecified acute and subacute iridocyclitis: Secondary | ICD-10-CM | POA: Diagnosis not present

## 2020-05-24 ENCOUNTER — Other Ambulatory Visit: Payer: Self-pay | Admitting: Family Medicine

## 2020-05-24 DIAGNOSIS — E785 Hyperlipidemia, unspecified: Secondary | ICD-10-CM

## 2020-06-23 DIAGNOSIS — C61 Malignant neoplasm of prostate: Secondary | ICD-10-CM | POA: Diagnosis not present

## 2020-07-12 ENCOUNTER — Other Ambulatory Visit: Payer: Self-pay | Admitting: Family Medicine

## 2020-07-12 DIAGNOSIS — I10 Essential (primary) hypertension: Secondary | ICD-10-CM

## 2020-08-17 DIAGNOSIS — H04123 Dry eye syndrome of bilateral lacrimal glands: Secondary | ICD-10-CM | POA: Diagnosis not present

## 2020-08-17 DIAGNOSIS — H2013 Chronic iridocyclitis, bilateral: Secondary | ICD-10-CM | POA: Diagnosis not present

## 2020-08-26 ENCOUNTER — Ambulatory Visit: Payer: Medicare Other

## 2020-08-30 ENCOUNTER — Other Ambulatory Visit: Payer: Self-pay

## 2020-08-30 ENCOUNTER — Ambulatory Visit (INDEPENDENT_AMBULATORY_CARE_PROVIDER_SITE_OTHER): Payer: Medicare Other

## 2020-08-30 DIAGNOSIS — Z23 Encounter for immunization: Secondary | ICD-10-CM | POA: Diagnosis not present

## 2020-10-01 DIAGNOSIS — H2012 Chronic iridocyclitis, left eye: Secondary | ICD-10-CM | POA: Diagnosis not present

## 2020-10-08 DIAGNOSIS — H2012 Chronic iridocyclitis, left eye: Secondary | ICD-10-CM | POA: Diagnosis not present

## 2020-10-27 DIAGNOSIS — H2012 Chronic iridocyclitis, left eye: Secondary | ICD-10-CM | POA: Diagnosis not present

## 2020-11-10 DIAGNOSIS — H2012 Chronic iridocyclitis, left eye: Secondary | ICD-10-CM | POA: Diagnosis not present

## 2020-11-18 ENCOUNTER — Encounter: Payer: Self-pay | Admitting: Family Medicine

## 2020-11-18 ENCOUNTER — Other Ambulatory Visit: Payer: Self-pay | Admitting: Family Medicine

## 2020-11-18 DIAGNOSIS — E119 Type 2 diabetes mellitus without complications: Secondary | ICD-10-CM

## 2020-11-18 MED ORDER — METFORMIN HCL 500 MG PO TABS: 500.0000 mg | ORAL_TABLET | Freq: Every day | ORAL | 0 refills | Status: DC

## 2020-11-19 DIAGNOSIS — U071 COVID-19: Secondary | ICD-10-CM | POA: Diagnosis not present

## 2020-11-24 DIAGNOSIS — H20022 Recurrent acute iridocyclitis, left eye: Secondary | ICD-10-CM | POA: Diagnosis not present

## 2020-11-26 ENCOUNTER — Ambulatory Visit (HOSPITAL_COMMUNITY)
Admission: EM | Admit: 2020-11-26 | Discharge: 2020-11-26 | Disposition: A | Discharge status: Home / Self Care | Payer: Medicare Other | Attending: Emergency Medicine | Admitting: Emergency Medicine

## 2020-11-26 ENCOUNTER — Other Ambulatory Visit: Payer: Self-pay

## 2020-11-26 ENCOUNTER — Encounter (HOSPITAL_COMMUNITY): Payer: Self-pay

## 2020-11-26 DIAGNOSIS — M5431 Sciatica, right side: Secondary | ICD-10-CM

## 2020-11-26 LAB — POCT URINALYSIS DIPSTICK, ED / UC
Bilirubin Urine: NEGATIVE
Glucose, UA: NEGATIVE mg/dL
Hgb urine dipstick: NEGATIVE
Ketones, ur: NEGATIVE mg/dL
Leukocytes,Ua: NEGATIVE
Nitrite: NEGATIVE
Protein, ur: 30 mg/dL — AB
Specific Gravity, Urine: 1.015 (ref 1.005–1.030)
Urobilinogen, UA: 0.2 mg/dL (ref 0.0–1.0)
pH: 5.5 (ref 5.0–8.0)

## 2020-11-26 MED ORDER — PREDNISONE 20 MG PO TABS: 40.0000 mg | ORAL_TABLET | Freq: Every day | ORAL | 0 refills | Status: AC

## 2020-11-26 NOTE — ED Provider Notes (Signed)
[place]    CSN: [id number] Arrival date & time: 11/26/20  1244      History   Chief Complaint Chief Complaint  Patient presents with   Back Pain    HPI Jason Rhodes is a 70 y.o. male.   Patient here for evaluation of lower back pain that has been ongoing for the past week.  Reports taking naproxen with minimal symptom relief.  Reports intermittent dysuria but states that it is not every time.  Reports back pain is constant and achy and radiates down right leg.  Reports pain is worse with movement.  Denies any loss of bowel or bladder control.  Denies any trauma, injury, or other precipitating event.  Denies any specific alleviating or aggravating factors.  Denies any fevers, chest pain, shortness of breath, N/V/D, numbness, tingling, weakness, abdominal pain, or headaches.    The history is provided by the patient.  Back Pain  Past Medical History:  Diagnosis Date   Bifascicular block    BPH (benign prostatic hyperplasia) 08/27/2012   Cancer ([place]) 06/11/2019   Prostate   COVID-19 virus infection 09/27/2019   Diabetes mellitus without complication ([place])    Dizziness    Essential hypertension 08/27/2012   Exposure to COVID-19 virus 09/23/2019   Hyperlipidemia    Left shoulder pain 04/03/2019   Neuropathy, cervical (radicular) 01/20/2015   New onset type 2 diabetes mellitus ([place]) 02/23/2015   OSA (obstructive sleep apnea) 02/13/2014   Sleep study 05/2014 - severe OSA; on CPAP    Painless rectal bleeding 08/29/2018   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 Active surveillance with oncology at [facility], Dr. Idolina Primer. Most recent PSAs ~3.   Right knee pain 01/30/2017   Sickle cell trait ([place]) 01/10/2013   Per patient report     Patient Active Problem List   Diagnosis Date Noted   Gout  01/06/2020   COVID-19 virus infection 09/27/2019   Bifascicular block 09/23/2019   Exposure to COVID-19 virus 09/23/2019   Prostate cancer ([place]) 07/01/2019   Left shoulder pain 04/03/2019   Painless rectal bleeding 08/29/2018   Right knee pain 01/30/2017   Controlled diabetes mellitus type 2 with complications ([place]) 77/93/9030   Neuropathy, cervical (radicular) 01/20/2015   OSA (obstructive sleep apnea) 02/13/2014   Hyperlipidemia 06/19/2013   Sickle cell trait ([place]) 01/10/2013   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 Authorizing Provider  predniSONE (DELTASONE) 20 MG tablet Take 2 tablets (40 mg total) by mouth daily for 5 days. 11/26/20 12/01/20 Yes Pearson Forster, NP  allopurinol (ZYLOPRIM) 100 MG tablet Take 1 tablet (100 mg total) by mouth daily. 03/05/20   Gladys Damme, MD  amLODipine (NORVASC) 10 MG tablet Take 1 tablet by mouth once daily 07/13/20   Gladys Damme, MD  atorvastatin (LIPITOR) 40 MG tablet Take 1 tablet by mouth once daily 05/25/20   Chauncey Reading,  Urban Gibson, MD  Blood Glucose Monitoring Suppl (ONE TOUCH ULTRA 2) W/DEVICE KIT Check sugars at least 3 times daily or as directed by provider 02/23/15   Katheren Shams, DO  gabapentin (NEURONTIN) 100 MG capsule Take 1 capsule (100 mg total) by mouth 3 (three) times daily. 12/29/16   Diallo, Earna Coder, MD  glucose blood test strip Use One test strip to check glucose level 3 times daily.  ICD-10 code: E11.9.  Use One Touch Ultra Blue Test Strips. 09/14/16   Dickie La, MD  lisinopril-hydrochlorothiazide (ZESTORETIC) 10-12.5 MG tablet Take 1 tablet by mouth once daily 04/08/20   Gladys Damme, MD   metFORMIN (GLUCOPHAGE) 500 MG tablet Take 1 tablet (500 mg total) by mouth daily with breakfast. 11/18/20   Gladys Damme, MD  metoprolol tartrate (LOPRESSOR) 100 MG tablet Take one tablet by mouth 2 hours prior to your CT 11/14/19   Belva Crome, MD  naproxen (NAPROSYN) 500 MG tablet Take 1 tablet (500 mg total) by mouth 2 (two) times daily with a meal. 03/05/20   Gladys Damme, MD  [facility] DELICA LANCETS 97Q MISC Use to test blood glucose 3 times daily. ICD-10 code: E11.9. 09/14/16   Dickie La, MD    Family History Family History  Problem Relation Age of Onset   Diabetes Mother    Hypertension Mother    Diabetes Father    Hypertension Father    Diabetes Brother    Diabetes Brother     Social History Social History   Tobacco Use   Smoking status: Former    Packs/day: 1.00    Years: 5.00    Pack years: 5.00    Types: Cigarettes    Quit date: 1980    Years since quitting: 42.6   Smokeless tobacco: Never  Substance Use Topics   Alcohol use: No   Drug use: No     Allergies   Patient has no known allergies.   Review of Systems Review of Systems  Genitourinary:  Negative for frequency and urgency.  Musculoskeletal:  Positive for back pain.  All other systems reviewed and are negative.   Physical Exam Triage Vital Signs ED Triage Vitals  Enc Vitals Group     BP 11/26/20 1300 134/82     Pulse Rate 11/26/20 1300 71     Resp 11/26/20 1300 18     Temp 11/26/20 1300 99 F (37.2 C)     Temp Source 11/26/20 1300 Oral     SpO2 11/26/20 1300 97 %     Weight --      Height --      Head Circumference --      Peak Flow --      Pain Score 11/26/20 1258 9     Pain Loc --      Pain Edu? --      Excl. in [place]? --    No data found.  Updated Vital Signs BP 134/82 (BP Location: Left Arm)   Pulse 71   Temp 99 F (37.2 C) (Oral)   Resp 18   SpO2 97%   Visual Acuity Right Eye Distance:   Left Eye Distance:   Bilateral Distance:    Right Eye Near:   Left  Eye Near:    Bilateral Near:     Physical Exam Vitals and nursing note reviewed.  Constitutional:      General: He is not in acute distress.    Appearance: Normal appearance. He is not ill-appearing,  toxic-appearing or diaphoretic.  HENT:     Head: Normocephalic and atraumatic.  Eyes:     Conjunctiva/sclera: Conjunctivae normal.  Cardiovascular:     Rate and Rhythm: Normal rate.     Pulses: Normal pulses.  Pulmonary:     Effort: Pulmonary effort is normal.  Abdominal:     General: Abdomen is flat.     Tenderness: There is no abdominal tenderness. There is no right CVA tenderness or left CVA tenderness.  Musculoskeletal:     Cervical back: Normal and normal range of motion.     Thoracic back: Normal.     Lumbar back: Spasms and tenderness present. No swelling or bony tenderness. Positive right straight leg raise test. Negative left straight leg raise test.     Right hip: Normal. No tenderness or bony tenderness. Normal range of motion.     Left hip: Normal.  Skin:    General: Skin is warm and dry.  Neurological:     General: No focal deficit present.     Mental Status: He is alert and oriented to person, place, and time.  Psychiatric:        Mood and Affect: Mood normal.     UC Treatments / Results  Labs (all labs ordered are listed, but only abnormal results are displayed) Labs Reviewed  POCT URINALYSIS DIPSTICK, ED / UC - Abnormal; Notable for the following components:      Result Value   Protein, ur 30 (*)    All other components within normal limits    EKG   Radiology No results found.  Procedures Procedures (including critical care time)  Medications Ordered in UC Medications - No data to display  Initial Impression / Assessment and Plan / UC Course  I have reviewed the triage vital signs and the nursing notes.  Pertinent labs & imaging results that were available during my care of the patient were reviewed by me and considered in my medical decision  making (see chart for details).    Assessment negative for red flags or concerns.  Sciatica of the right side.  Urinalysis positive for protein but otherwise negative.  Will treat sciatica with prednisone daily for the next 5 days.  May also take Tylenol as needed.  Encourage fluids and rest.  Recommend gentle stretching and exercises.  May use heat, ice, or OTC pain relief.  Follow-up with primary care as needed Final Clinical Impressions(s) / UC Diagnoses   Final diagnoses:  Sciatica of right side     Discharge Instructions      Take the prednisone daily for the next 5 days.  It is best to take this medication in the morning.  Do not take any ibuprofen or naproxen while you are taking the prednisone.  Once you have completed the prednisone you may take naproxen or ibuprofen again. You can take Tylenol as needed for pain.  Rest and drink plenty of fluids. Do gentle stretching and exercises to help with pain. You can use heat, ice, or alternate between heat and ice for comfort.  You may also try IcyHot, lidocaine patches, or Biofreeze as needed.  Return or go to the Emergency Department if symptoms worsen or do not improve in the next few days.      ED Prescriptions     Medication Sig Dispense Auth. Provider   predniSONE (DELTASONE) 20 MG tablet Take 2 tablets (40 mg total) by mouth daily for 5 days. 10 tablet Ivette Loyal, NP  PDMP not reviewed this encounter.   Pearson Forster, NP 11/26/20 1324

## 2020-11-26 NOTE — ED Triage Notes (Signed)
Pt reports lower back pain and burning sensation whine urinating x 1 week.

## 2020-11-26 NOTE — Discharge Instructions (Addendum)
Take the prednisone daily for the next 5 days.  It is best to take this medication in the morning.  Do not take any ibuprofen or naproxen while you are taking the prednisone.  Once you have completed the prednisone you may take naproxen or ibuprofen again. You can take Tylenol as needed for pain.  Rest and drink plenty of fluids. Do gentle stretching and exercises to help with pain. You can use heat, ice, or alternate between heat and ice for comfort.  You may also try IcyHot, lidocaine patches, or Biofreeze as needed.  Return or go to the Emergency Department if symptoms worsen or do not improve in the next few days.

## 2020-12-07 ENCOUNTER — Ambulatory Visit (INDEPENDENT_AMBULATORY_CARE_PROVIDER_SITE_OTHER): Payer: Medicare Other | Admitting: Family Medicine

## 2020-12-07 ENCOUNTER — Other Ambulatory Visit: Payer: Self-pay

## 2020-12-07 ENCOUNTER — Encounter: Payer: Self-pay | Admitting: Family Medicine

## 2020-12-07 VITALS — BP 138/74 | HR 90 | Ht 66.0 in | Wt 204.0 lb

## 2020-12-07 DIAGNOSIS — E119 Type 2 diabetes mellitus without complications: Secondary | ICD-10-CM | POA: Diagnosis not present

## 2020-12-07 DIAGNOSIS — E782 Mixed hyperlipidemia: Secondary | ICD-10-CM

## 2020-12-07 DIAGNOSIS — M1A4720 Other secondary chronic gout, left ankle and foot, without tophus (tophi): Secondary | ICD-10-CM | POA: Diagnosis not present

## 2020-12-07 DIAGNOSIS — E118 Type 2 diabetes mellitus with unspecified complications: Secondary | ICD-10-CM

## 2020-12-07 DIAGNOSIS — Z23 Encounter for immunization: Secondary | ICD-10-CM | POA: Diagnosis not present

## 2020-12-07 DIAGNOSIS — M5431 Sciatica, right side: Secondary | ICD-10-CM | POA: Diagnosis not present

## 2020-12-07 DIAGNOSIS — I10 Essential (primary) hypertension: Secondary | ICD-10-CM | POA: Diagnosis not present

## 2020-12-07 DIAGNOSIS — M10471 Other secondary gout, right ankle and foot: Secondary | ICD-10-CM

## 2020-12-07 DIAGNOSIS — Z0000 Encounter for general adult medical examination without abnormal findings: Secondary | ICD-10-CM | POA: Diagnosis not present

## 2020-12-07 LAB — POCT GLYCOSYLATED HEMOGLOBIN (HGB A1C): HbA1c, POC (controlled diabetic range): 6.2 % (ref 0.0–7.0)

## 2020-12-07 MED ORDER — METFORMIN HCL 500 MG PO TABS: 500.0000 mg | ORAL_TABLET | Freq: Every day | ORAL | 3 refills | Status: DC

## 2020-12-07 MED ORDER — ALLOPURINOL 300 MG PO TABS: 150.0000 mg | ORAL_TABLET | Freq: Every day | ORAL | 3 refills | Status: DC

## 2020-12-07 NOTE — Assessment & Plan Note (Signed)
Patient currently on Crestor 40 mg.  We will repeat LDL today and CMP.

## 2020-12-07 NOTE — Assessment & Plan Note (Signed)
Patient presents with improving flare of right-sided sciatica.  He is status post 5 days of prednisone, A1c remains appropriate.  I recommend that he can use over-the-counter analgesics sparingly for pain, also recommend core exercises, given home exercise program and counseled on the natural history of back pain.  He has no red flags, given return precautions if there is ever develop.  If no improvement with home exercise program, recommend patient return for evaluation and will be referred to physical therapy at that point in time.  Also recommend topical analgesia such as Voltaren gel, etc.

## 2020-12-07 NOTE — Assessment & Plan Note (Signed)
Patient is presently well controlled continue current management

## 2020-12-07 NOTE — Assessment & Plan Note (Addendum)
Patient is still currently well controlled, A1c today 6.2%.  Continue metformin 500 mg every morning.  He is also on appropriate statin therapy, and ACEi.  Reminded patient to see his ophthalmologist annually.

## 2020-12-07 NOTE — Patient Instructions (Signed)
It was a pleasure to see you today!  Your A1c was 6.2% today, perfect! Keep up the good work. Continue to take metformin 500 mg daily. For your gout: increase allopurinol to 150 mg daily. I will get a uric acid level which well help tell me if we need to go up higher. For your back pain: I'm giving you exercises for strengthening. Try these with topical treatments like voltaren gel, capsaicin, icy hot or bio freeze (whichever you prefer). IF you do not feel better in 3-4 weeks or your symptoms worsen, schedule a follow up as you may need physical therapy You will get a pneumonia and flu vaccine today We will get some labs today.  If they are abnormal or we need to do something about them, I will call you.  If they are normal, I will send you a message on MyChart (if it is active) or a letter in the mail.  If you don't hear from [place] in 2 weeks, please call the office  [phone].    Be Well,  Dr. Chauncey Reading

## 2020-12-07 NOTE — Assessment & Plan Note (Addendum)
Will check renal function and uric acid today.  Increase allopurinol to 150 mg daily, will titrate as able to 300.

## 2020-12-07 NOTE — Assessment & Plan Note (Signed)
Patient will get by valent COVID-vaccine from [facility] at his convenience.  Today he will get pneumococcal and flu vaccines.

## 2020-12-07 NOTE — Progress Notes (Signed)
SUBJECTIVE:   CHIEF COMPLAINT / HPI:   DM2: Last A1c 12/2019 was 6.3%. Today A1c is appropriate at 6.2%. Patient currently on metformin 500 mg qd. He had a flare of sciatica last week and received 5 days of oral prednisone.   Essential HTN: Today patient's BP is at goal. His current regimen is lisinopril-HCTZ 10-12.'5mg'$ , will refill and check renal function today.  HLD: patient is taking atorvastatin 40 mg qd. Last lipid panel 12/2019 was appropriate with LDL 56. Will recheck today with CMP.  Gout: Patient reports that he has had some gout flares recently, at first it was in his right toes, now and his left great toe.  He reports he does not have any symptoms today, however he has noted this in the last several weeks.  We will check uric acid today as well as renal function.  Sciatica: Had recent flare sciatica which she states was pain in his right lower back that radiated down past his knee into his leg.  This pain was shooting and barely tolerable.  He went to urgent care and received 5 days of prednisone, reports he feels much better since then but still has some radicular symptoms.  Denies saddle anesthesia, incontinence, no red flags.  He wants to begin a walking regimen again with his wife, asks what else he can do for his pain.  HCM: Discussed by bivalent COVID-vaccine, flu vaccine, pneumococcal vaccine  PERTINENT  PMH / PSH: DM2, HLD, prostate cancer, HTN, gout  OBJECTIVE:   BP 138/74   Pulse 90   Ht '5\' 6"'$  (1.676 m)   Wt 204 lb (92.5 kg)   SpO2 95%   BMI 32.93 kg/m   Nursing note and vitals reviewed GEN: Age-appropriate AAM, resting comfortably in chair, NAD, class I obesity Cardiac: Regular rate and rhythm. Normal S1/S2. No murmurs, rubs, or gallops appreciated. 2+ radial pulses. Lungs: Clear bilaterally to ascultation. No increased WOB, no accessory muscle usage. No w/r/r. Lumbar spine: - Inspection: no gross deformity or asymmetry, swelling or ecchymosis. No skin  changes - Palpation: No TTP over the spinous processes, paraspinal muscles, or SI joints b/l - ROM: full active ROM of the lumbar spine in flexion and extension without pain - Strength: 5/5 strength of lower extremity in L4-S1 nerve root distributions b/l - Neuro: sensation intact in the L4-S1 nerve root distribution b/l, 2+ L4 and S1 reflexes - Straight Leg Raise test: pos ipsilateral side Ext: no edema Psych: Pleasant and appropriate   ASSESSMENT/PLAN:   Controlled diabetes mellitus type 2 with complications ([place]) Patient is still currently well controlled, A1c today 6.2%.  Continue metformin 500 mg every morning.  He is also on appropriate statin therapy, and ACEi.  Reminded patient to see his ophthalmologist annually.  Essential hypertension Patient is presently well controlled continue current management  Gout Will check renal function and uric acid today.  Increase allopurinol to 150 mg daily, will titrate as able to 300.  Hyperlipidemia Patient currently on Crestor 40 mg.  We will repeat LDL today and CMP.  Right sided sciatica Patient presents with improving flare of right-sided sciatica.  He is status post 5 days of prednisone, A1c remains appropriate.  I recommend that he can use over-the-counter analgesics sparingly for pain, also recommend core exercises, given home exercise program and counseled on the natural history of back pain.  He has no red flags, given return precautions if there is ever develop.  If no improvement with home exercise program,  recommend patient return for evaluation and will be referred to physical therapy at that point in time.  Also recommend topical analgesia such as Voltaren gel, etc.  Health care maintenance Patient will get by valent COVID-vaccine from [facility] at his convenience.  Today he will get pneumococcal and flu vaccines.     Gladys Damme, MD [place]

## 2020-12-08 ENCOUNTER — Ambulatory Visit: Payer: Medicare Other | Admitting: Family Medicine

## 2020-12-08 LAB — COMPREHENSIVE METABOLIC PANEL
ALT: 38 IU/L (ref 0–44)
AST: 26 IU/L (ref 0–40)
Albumin/Globulin Ratio: 1.9 (ref 1.2–2.2)
Albumin: 4.8 g/dL — ABNORMAL HIGH (ref 3.7–4.7)
Alkaline Phosphatase: 54 IU/L (ref 44–121)
BUN/Creatinine Ratio: 8 — ABNORMAL LOW (ref 10–24)
BUN: 10 mg/dL (ref 8–27)
Bilirubin Total: 0.5 mg/dL (ref 0.0–1.2)
CO2: 24 mmol/L (ref 20–29)
Calcium: 9.5 mg/dL (ref 8.6–10.2)
Chloride: 101 mmol/L (ref 96–106)
Creatinine, Ser: 1.25 mg/dL (ref 0.76–1.27)
Globulin, Total: 2.5 g/dL (ref 1.5–4.5)
Glucose: 132 mg/dL — ABNORMAL HIGH (ref 65–99)
Potassium: 4.4 mmol/L (ref 3.5–5.2)
Sodium: 138 mmol/L (ref 134–144)
Total Protein: 7.3 g/dL (ref 6.0–8.5)
eGFR: 62 mL/min/{1.73_m2} (ref >59)

## 2020-12-08 LAB — LDL CHOLESTEROL, DIRECT: LDL Direct: 49 mg/dL (ref 0–99)

## 2020-12-08 LAB — URIC ACID: Uric Acid: 8.2 mg/dL (ref 3.8–8.4)

## 2020-12-09 ENCOUNTER — Encounter: Payer: Self-pay | Admitting: Family Medicine

## 2020-12-14 DIAGNOSIS — U071 COVID-19: Secondary | ICD-10-CM | POA: Diagnosis not present

## 2020-12-29 DIAGNOSIS — C61 Malignant neoplasm of prostate: Secondary | ICD-10-CM | POA: Diagnosis not present

## 2021-01-01 DIAGNOSIS — U071 COVID-19: Secondary | ICD-10-CM | POA: Diagnosis not present

## 2021-01-25 DIAGNOSIS — H20022 Recurrent acute iridocyclitis, left eye: Secondary | ICD-10-CM | POA: Diagnosis not present

## 2021-02-01 ENCOUNTER — Other Ambulatory Visit: Payer: Self-pay

## 2021-02-01 ENCOUNTER — Ambulatory Visit: Payer: Medicare Other

## 2021-02-01 ENCOUNTER — Ambulatory Visit (INDEPENDENT_AMBULATORY_CARE_PROVIDER_SITE_OTHER): Payer: Medicare Other

## 2021-02-01 DIAGNOSIS — Z23 Encounter for immunization: Secondary | ICD-10-CM

## 2021-02-28 ENCOUNTER — Other Ambulatory Visit: Payer: Self-pay

## 2021-02-28 DIAGNOSIS — E785 Hyperlipidemia, unspecified: Secondary | ICD-10-CM

## 2021-02-28 MED ORDER — ATORVASTATIN CALCIUM 40 MG PO TABS: 40.0000 mg | ORAL_TABLET | Freq: Every day | ORAL | 3 refills | Status: DC

## 2021-03-18 ENCOUNTER — Other Ambulatory Visit: Payer: Self-pay

## 2021-03-18 DIAGNOSIS — E119 Type 2 diabetes mellitus without complications: Secondary | ICD-10-CM

## 2021-03-18 MED ORDER — METFORMIN HCL 500 MG PO TABS: 500.0000 mg | ORAL_TABLET | Freq: Every day | ORAL | 1 refills | Status: DC

## 2021-03-27 HISTORY — PX: CATARACT EXTRACTION, BILATERAL: SHX1313

## 2021-03-28 ENCOUNTER — Other Ambulatory Visit: Payer: Self-pay | Admitting: Family Medicine

## 2021-03-28 DIAGNOSIS — I10 Essential (primary) hypertension: Secondary | ICD-10-CM

## 2021-05-31 ENCOUNTER — Ambulatory Visit (INDEPENDENT_AMBULATORY_CARE_PROVIDER_SITE_OTHER): Payer: Medicare Other

## 2021-05-31 ENCOUNTER — Other Ambulatory Visit: Payer: Self-pay

## 2021-05-31 DIAGNOSIS — Z0000 Encounter for general adult medical examination without abnormal findings: Secondary | ICD-10-CM | POA: Diagnosis not present

## 2021-05-31 NOTE — Progress Notes (Signed)
° °Subjective:  ° Jason Rhodes is a 70 y.o. male who presents for Medicare Annual/Subsequent preventive examination. ° °The patient consented to a virtual visit. °Patient consented to have virtual visit and was identified by name and date of birth. °Method of visit: Telephone ° °Encounter participants: °Patient: Jason Rhodes - located at Home °Nurse/Provider: Emily P Scott - located at [facility] °Others (if applicable): NA ° °Review of Systems: Defer to PCP.  ° °Cardiac Risk Factors include: diabetes mellitus;male gender;hypertension;dyslipidemia ° °Objective:  ° °Vitals: There were no vitals taken for this visit.  There is no height or weight on file to calculate BMI. ° °Advanced Directives 05/31/2021 12/07/2020 01/30/2020 09/26/2019 06/30/2019 06/17/2019 01/30/2017  °Does Patient Have a Medical Advance Directive? No No No No No No No  °Would patient like information on creating a medical advance directive? Yes (MAU/Ambulatory/Procedural Areas - Information given) No - Patient declined No - Patient declined No - Patient declined No - Patient declined Yes (MAU/Ambulatory/Procedural Areas - Information given) No - Patient declined  ° °Tobacco °Social History  ° °Tobacco Use  °Smoking Status Former  ° Packs/day: 1.00  ° Years: 5.00  ° Pack years: 5.00  ° Types: Cigarettes  ° Quit date: 1980  ° Years since quitting: 43.2  ° Passive exposure: Past  °Smokeless Tobacco Never  °   °Counseling given: No plans to restart.  ° °Clinical Intake: ° °Pre-visit preparation completed: Yes ° °Pain Score: 2  ° °Diabetic: Yes- Controlled ° °How often do you need to have someone help you when you read instructions, pamphlets, or other written materials from your doctor or pharmacy?: 1 - Never °What is the last grade level you completed in school?: Masters Degree- Transportation ° °Past Medical History:  °Diagnosis Date  ° Bifascicular block   ° BPH (benign prostatic hyperplasia) 08/27/2012  ° Cancer (HCC) 06/11/2019  ° Prostate  ° COVID-19 virus infection  09/27/2019  ° Diabetes mellitus without complication (HCC)   ° Dizziness   ° Essential hypertension 08/27/2012  ° Exposure to COVID-19 virus 09/23/2019  ° Hyperlipidemia   ° Left shoulder pain 04/03/2019  ° Neuropathy, cervical (radicular) 01/20/2015  ° New onset type 2 diabetes mellitus (HCC) 02/23/2015  ° OSA (obstructive sleep apnea) 02/13/2014  ° Sleep study 05/2014 - severe OSA; on CPAP   ° Painless rectal bleeding 08/29/2018  ° 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 Active surveillance with oncology at [facility], Dr. Dunn. Most recent PSAs ~3.  ° Right knee pain 01/30/2017  ° Sickle cell trait (HCC) 01/10/2013  ° Per patient report   ° °Past Surgical History:  °Procedure Laterality Date  ° HERNIA REPAIR    ° PROSTATE SURGERY    ° SHOULDER SURGERY    ° °Family History  °Problem Relation Age of Onset  ° Diabetes Mother   ° Hypertension Mother   ° Diabetes Father   ° Hypertension Father   ° Hypertension Brother   ° Diabetes Brother   ° Diabetes Brother   ° °Social History  ° °Socioeconomic History  ° Marital status: Married  °  Spouse name: Barbara   ° Number of children: 3  ° Years of education: 18  ° Highest education level: Master's degree (e.g., MA, MS, MEng, MEd, MSW, MBA)  °  MBA)  Occupational History   Occupation: Retired  Tobacco Use   Smoking status: Former    Packs/day: 1.00    Years: 5.00    Pack years: 5.00    Types: Cigarettes    Quit date: 1980    Years since quitting: 43.2    Passive exposure: Past   Smokeless tobacco: Never  Vaping Use   Vaping Use: Never used  Substance and Sexual Activity   Alcohol use: No   Drug use: No   Sexual activity: Yes  Other Topics Concern   Not on file  Social History Narrative   Patient lives with his wife Pamala Hurry [facility] patient.)    Patient enjoys working out with his wife at [employer].   Exercises ~daily for 1 hour.   Owned delivery business- now fully retired.   Patient enjoys spending time with his family, his 3 children.    Social Determinants of Health   Financial Resource Strain: Low Risk    Difficulty of Paying Living Expenses: Not hard at all  Food Insecurity: No Food Insecurity   Worried About Charity fundraiser in the Last Year: Never true   [place] in the Last Year: Never true  Transportation Needs: No Transportation Needs   Lack of Transportation (Medical): No   Lack of Transportation (Non-Medical): No  Physical Activity: Sufficiently Active   Days of Exercise per Week: 7 days   Minutes of Exercise per Session: 60 min  Stress: No Stress Concern Present   Feeling of Stress : Not at all  Social Connections: Moderately Integrated   Frequency of Communication with Friends and Family: More than three times a week   Frequency of Social Gatherings with Friends and Family: More than three times a week   Attends Religious Services: More than 4 times per year   Active Member of [employer] or Organizations: No   Attends Archivist Meetings: Never   Marital Status: Married   Outpatient Encounter Medications as of 05/31/2021  Medication Sig   allopurinol (ZYLOPRIM) 300 MG tablet Take 0.5 tablets (150 mg total) by mouth daily.   amLODipine (NORVASC) 10 MG tablet Take 1 tablet by mouth once daily   atorvastatin (LIPITOR) 40 MG tablet Take 1 tablet (40 mg total) by mouth daily.   Blood Glucose Monitoring Suppl (ONE TOUCH ULTRA 2) W/DEVICE KIT Check sugars at least 3 times daily or as directed by provider   glucose blood test strip Use One test strip to check glucose level 3 times daily.  ICD-10 code: E11.9.  Use One Touch Ultra Blue Test Strips.   lisinopril-hydrochlorothiazide (ZESTORETIC) 10-12.5 MG tablet Take 1 tablet by mouth once daily   metFORMIN (GLUCOPHAGE) 500 MG tablet Take 1 tablet (500 mg total) by mouth daily with breakfast.    naproxen (NAPROSYN) 500 MG tablet Take 1 tablet (500 mg total) by mouth 2 (two) times daily with a meal.   ONETOUCH DELICA LANCETS 72Z MISC Use to test blood glucose 3 times daily. ICD-10 code: E11.9.   gabapentin (NEURONTIN) 100 MG capsule Take 1 capsule (100 mg total) by mouth 3 (three) times daily. (Patient taking differently: Take 100 mg by mouth 3 (three) times daily. PRN)   metoprolol tartrate (LOPRESSOR) 100 MG tablet Take one tablet by mouth 2 hours prior to your CT   No facility-administered encounter medications on file as of 05/31/2021.   Activities of Daily Living In your present state of health, do you have any  difficulty performing the following activities: 05/31/2021  Hearing? N  Vision? N  Difficulty concentrating or making decisions? N  Walking or climbing stairs? N  Dressing or bathing? N  Doing errands, shopping? N  Preparing Food and eating ? N  Using the Toilet? N  In the past six months, have you accidently leaked urine? N  Do you have problems with loss of bowel control? N  Managing your Medications? N  Managing your Finances? N  Housekeeping or managing your Housekeeping? N  Some recent data might be hidden   Patient Care Team: Gladys Damme, MD as PCP - General Tamala Julian Lynnell Dike, MD as Consulting Physician (Cardiology) Richmond Campbell, MD as Consulting Physician (Gastroenterology) Idolina Primer, Kristine Royal, NP as Nurse Practitioner (Nurse Practitioner)   Assessment:   This is a routine wellness examination for [place].  Exercise Activities and Dietary recommendations Current Exercise Habits: Home exercise routine, Type of exercise: treadmill, Time (Minutes): 60, Frequency (Times/Week): 7, Weekly Exercise (Minutes/Week): 420, Exercise limited by: cardiac condition(s)   Goals      HEMOGLOBIN A1C < 6     Maintain A1c around 5.5.  A1c 6.2 in 11/2020.       Fall Risk Fall Risk  05/31/2021 12/07/2020 09/26/2019 09/19/2019 06/30/2019  Falls in the past year? 0 0 0 0 0  Number  falls in past yr: 0 0 0 0 0  Injury with Fall? 0 - 0 - 0  Risk for fall due to : History of fall(s) - - - -  Follow up - - - Falls evaluation completed -   Patient reports normal gait and balance. Patient does not use assistive devices to ambulate.  Is the patient's home free of loose throw rugs in walkways, pet beds, electrical cords, etc?   yes      Grab bars in the bathroom? yes      Handrails on the stairs?   yes      Adequate lighting?   yes  Patient rating of health (0-10): 10  Depression Screen PHQ 2/9 Scores 05/31/2021 12/07/2020 01/30/2020 09/26/2019  PHQ - 2 Score 0 0 0 0  PHQ- 9 Score - 0 0 -   Cognitive Function  6CIT Screen 05/31/2021 06/17/2019  What Year? 0 points 0 points  What month? 0 points 0 points  What time? 0 points 0 points  Count back from 20 0 points 0 points  Months in reverse 0 points 2 points  Repeat phrase 0 points 0 points  Total Score 0 2   Immunization History  Administered Date(s) Administered   Fluad Quad(high Dose 65+) 12/07/2020   Influenza,inj,Quad PF,6+ Mos 01/08/2013, 02/13/2014, 12/07/2014, 12/29/2016   Influenza-Unspecified 12/16/2015   PFIZER Comirnaty(Gray Top)Covid-19 Tri-Sucrose Vaccine 08/30/2020   PFIZER(Purple Top)SARS-COV-2 Vaccination 01/30/2020, 02/27/2020   Pfizer Covid-19 Vaccine Bivalent Booster 30yr & up 02/01/2021   Pneumococcal Conjugate-13 06/09/2015   Pneumococcal Polysaccharide-23 12/07/2020   Tdap 01/08/2013   Zoster, Live 06/18/2015   Qualifies for Shingles Vaccine? Yes   Shingrix Completed: No, Education has been provided regarding the importance of this vaccine. Advised may receive this vaccine at local pharmacy or Health Dept. Aware to provide a copy of the vaccination record if obtained from local pharmacy or Health Dept. Verbalized acceptance and understanding.  Screening Tests Health Maintenance  Topic Date Due   Zoster Vaccines- Shingrix (1 of 2) Never done   OPHTHALMOLOGY EXAM  04/06/2020   FOOT EXAM   06/29/2020   HEMOGLOBIN A1C  06/06/2021  01/09/2023  ° COLONOSCOPY (Pts 45-49yrs Insurance coverage will need to be confirmed)  01/19/2025  ° Pneumonia Vaccine 65+ Years old  Completed  ° INFLUENZA VACCINE  Completed  ° COVID-19 Vaccine  Completed  ° Hepatitis C Screening  Completed  ° HPV VACCINES  Aged Out  ° °Cancer Screenings: °Lung: Low Dose CT Chest recommended if Age 55-80 years, 30 pack-year currently smoking OR have quit w/in 15years. Patient does not qualify. °Colorectal: UTD ° °Additional Screenings: °HIV Screening: Completed  °Hepatitis C Screening: Completed  ° °Plan:  °PCP apt scheduled for 3/31 for diabetes management.  °Due for shingles vaccine- you can get this at your local pharmacy.  °Keep up the great work exercising!  ° °I have personally reviewed and noted the following in the patient’s chart:  ° °Medical and social history °Use of alcohol, tobacco or illicit drugs  °Current medications and supplements °Functional ability and status °Nutritional status °Physical activity °Advanced directives °List of other physicians °Hospitalizations, surgeries, and ER visits in previous 12 months °Vitals °Screenings to include cognitive, depression, and falls °Referrals and appointments ° °In addition, I have reviewed and discussed with patient certain preventive protocols, quality metrics, and best practice recommendations. A written personalized care plan for preventive services as well as general preventive health recommendations were provided to patient. ° °This visit was conducted virtually in the setting of the COVID19 pandemic.  ° °Emily P Scott, CMA  06/01/2021 ° °  °

## 2021-06-01 NOTE — Progress Notes (Signed)
I have reviewed this visit and agree with the documentation.   

## 2021-06-01 NOTE — Patient Instructions (Signed)
Thank you for taking time to come for your Medicare Wellness Visit. I appreciate your ongoing commitment to your health goals. Please review the following plan we discussed and let me know if I can assist you in the future.  ?  ?These are the goals we discussed: ? ? Goals   ? ?  HEMOGLOBIN A1C < 6   ?  Maintain A1c around 5.5.  ?A1c 6.2 in 11/2020. ?  ? ?  ? ?We also discussed recommended health maintenance. As discussed, you are due for: ?Health Maintenance  ?Topic Date Due  ? Zoster Vaccines- Shingrix (1 of 2) Never done  ? OPHTHALMOLOGY EXAM  04/06/2020  ? FOOT EXAM  06/29/2020  ? HEMOGLOBIN A1C  06/06/2021  ? TETANUS/TDAP  01/09/2023  ? COLONOSCOPY (Pts 45-75yr Insurance coverage will need to be confirmed)  01/19/2025  ? Pneumonia Vaccine 72 Years old  Completed  ? INFLUENZA VACCINE  Completed  ? COVID-19 Vaccine  Completed  ? Hepatitis C Screening  Completed  ? HPV VACCINES  Aged Out  ? ?PCP apt scheduled for 3/31 for diabetes management.  ?Due for shingles vaccine- you can get this at your local pharmacy.  ?Keep up the great work exercising!  ? ?Preventive Care 727Years and Older, Male ?Preventive care refers to lifestyle choices and visits with your health care provider that can promote health and wellness. Preventive care visits are also called wellness exams. ?What can I expect for my preventive care visit? ?Counseling ?During your preventive care visit, your health care provider may ask about your: ?Medical history, including: ?Past medical problems. ?Family medical history. ?History of falls. ?Current health, including: ?Emotional well-being. ?Home life and relationship well-being. ?Sexual activity. ?Memory and ability to understand (cognition). ?Lifestyle, including: ?Alcohol, nicotine or tobacco, and drug use. ?Access to firearms. ?Diet, exercise, and sleep habits. ?Work and work eStatistician ?Sunscreen use. ?Safety issues such as seatbelt and bike helmet use. ?Physical exam ?Your health care provider  will check your: ?Height and weight. These may be used to calculate your BMI (body mass index). BMI is a measurement that tells if you are at a healthy weight. ?Waist circumference. This measures the distance around your waistline. This measurement also tells if you are at a healthy weight and may help predict your risk of certain diseases, such as type 2 diabetes and high blood pressure. ?Heart rate and blood pressure. ?Body temperature. ?Skin for abnormal spots. ?What immunizations do I need? ?Vaccines are usually given at various ages, according to a schedule. Your health care provider will recommend vaccines for you based on your age, medical history, and lifestyle or other factors, such as travel or where you work. ?What tests do I need? ?Screening ?Your health care provider may recommend screening tests for certain conditions. This may include: ?Lipid and cholesterol levels. ?Diabetes screening. This is done by checking your blood sugar (glucose) after you have not eaten for a while (fasting). ?Hepatitis C test. ?Hepatitis B test. ?HIV (human immunodeficiency virus) test. ?STI (sexually transmitted infection) testing, if you are at risk. ?Lung cancer screening. ?Colorectal cancer screening. ?Prostate cancer screening. ?Abdominal aortic aneurysm (AAA) screening. You may need this if you are a current or former smoker. ?Talk with your health care provider about your test results, treatment options, and if necessary, the need for more tests. ?Follow these instructions at home: ?Eating and drinking ? ?Eat a diet that includes fresh fruits and vegetables, whole grains, lean protein, and low-fat dairy products. Limit your intake  of foods with high amounts of sugar, saturated fats, and salt. ?Take vitamin and mineral supplements as recommended by your health care provider. ?Do not drink alcohol if your health care provider tells you not to drink. ?If you drink alcohol: ?Limit how much you have to 0-2 drinks a  day. ?Know how much alcohol is in your drink. In the [place] one drink equals one 12 oz bottle of beer (355 mL), one 5 oz glass of wine (148 mL), or one 1? oz glass of hard liquor (44 mL). ?Lifestyle ?Brush your teeth every morning and night with fluoride toothpaste. Floss one time each day. ?Exercise for at least 30 minutes 5 or more days each week. ?Do not use any products that contain nicotine or tobacco. These products include cigarettes, chewing tobacco, and vaping devices, such as e-cigarettes. If you need help quitting, ask your health care provider. ?Do not use drugs. ?If you are sexually active, practice safe sex. Use a condom or other form of protection to prevent STIs. ?Take aspirin only as told by your health care provider. Make sure that you understand how much to take and what form to take. Work with your health care provider to find out whether it is safe and beneficial for you to take aspirin daily. ?Ask your health care provider if you need to take a cholesterol-lowering medicine (statin). ?Find healthy ways to manage stress, such as: ?Meditation, yoga, or listening to music. ?Journaling. ?Talking to a trusted person. ?Spending time with friends and family. ?Safety ?Always wear your seat belt while driving or riding in a vehicle. ?Do not drive: ?If you have been drinking alcohol. Do not ride with someone who has been drinking. ?When you are tired or distracted. ?While texting. ?If you have been using any mind-altering substances or drugs. ?Wear a helmet and other protective equipment during sports activities. ?If you have firearms in your house, make sure you follow all gun safety procedures. ?Minimize exposure to UV radiation to reduce your risk of skin cancer. ?What's next? ?Visit your health care provider once a year for an annual wellness visit. ?Ask your health care provider how often you should have your eyes and teeth checked. ?Stay up to date on all vaccines. ?This information is not intended  to replace advice given to you by your health care provider. Make sure you discuss any questions you have with your health care provider. ?Document Revised: 09/08/2020 Document Reviewed: 09/08/2020 ?Elsevier Patient Education ? 2022 [place]. ? ? ?Our clinic's number is [phone]. Please call with questions or concerns about what we discussed today.  ? ?

## 2021-06-23 DIAGNOSIS — Z20822 Contact with and (suspected) exposure to covid-19: Secondary | ICD-10-CM | POA: Diagnosis not present

## 2021-06-23 NOTE — Progress Notes (Signed)
? ? ?  SUBJECTIVE:  ? ?CHIEF COMPLAINT / HPI:  ? ?DM2: Last A1c was controlled at 6.2% in 9/22. Current regimen is metformin 500 mg qd. Last UACR checked in 10/21, WNL at 12. Will check UACR, A1c today. ? ?Essential hypertension: Today BP is at goal: 126/74. Current regimen is lisinopril-HCTZ 10-12.'5mg'$ . Will refill and check renal function and electrolytes today. ? ?HLD: Current regimen is atorvastatin 40 mg qd. Last lipid panel done in 2021, will repeat today. LDL was below goal of <70 on 12/07/20. ? ?HC Maintenance: UTD with ophthalmology, recently had cataract surgery. He has had shingles vaccine. ? ?Mood: patient reports that he is going through a stressful time. He is being followed for prostate cancer with urology at [facility]. One of his sons is having a CABG in the next week, he also lost a brother last year, and one of his brothers in [place] was recently in a terrible accident and badly injured. Patient would like to work with a therapist to process his emotions. He has noticed that he has been more tearful lately and worried. No SI. Insurance is Information systems manager. ? ?PERTINENT  PMH / PSH: DM2, hypetension, HLD ? ?OBJECTIVE:  ? ?BP 126/74   Pulse 82   Wt 202 lb 12.8 oz (92 kg)   SpO2 98%   BMI 32.73 kg/m?   ?Nursing note and vitals reviewed ?GEN: age-appropriate, African man, resting comfortably in chair, NAD, class I obesity ?HEENT: NCAT. PERRLA. Sclera without injection or icterus. MMM.  ?Neck: Supple. No LAD ?Cardiac: Regular rate and rhythm. Normal S1/S2. No murmurs, rubs, or gallops appreciated. 2+ radial pulses. ?Lungs: Clear bilaterally to ascultation. No increased WOB, no accessory muscle usage. No w/r/r. ?Neuro: AOx3  ?Ext: no edema ?Psych: Pleasant and appropriate  ?Diabetic Foot Exam - Simple   ?Simple Foot Form ? 06/24/2021 11:18 AM  ?Visual Inspection ?No deformities, no ulcerations, no other skin breakdown bilaterally: Yes ?Sensation Testing ?Intact to touch and monofilament testing bilaterally: Yes ?Pulse  Check ?Posterior Tibialis and Dorsalis pulse intact bilaterally: Yes ?Comments ?No tophi ?  ? ?ASSESSMENT/PLAN:  ? ?Prostate cancer ([place]) ?Patient is followed by [facility] urology. Recommend he ask them about support groups for other people with prostate cancer. ? ?Hyperlipidemia ?Patient is on atorvastatin 40 mg qd. Will repeat lipid panel and CMP today. ? ?Health care maintenance ?Patient has completed zoster series. He is UTD with ophthalmology. ? ?Gout ?Patient on 150 mg allopurinol. He recently had dietary indiscretions with chicken liver (his daughter was eating it for anemia), he did not realize this could precipitate gout. Will check uric acid. Could increase to 200 mg or stay at current dose. Discussed food and beverages that can precipitate gouty attacks. No tophi. ? ?Controlled diabetes mellitus type 2 with complications ([place]) ?Will check UACR for protein. A1c well controlled at 6.3% today. Continue current regimen. ? ?Essential hypertension ?Chronic, controlled. No change to current regimen. Will check metabolic panel. ? ?Adjustment disorder ?Patient has significant stressors happening in his life, would like support from therapy. Patient has medicare, given resources to find a therapist. Discussed crisis resources. Encouraged to follow up if symptoms worsen or trouble finding therapy. ?  ? ? ?Gladys Damme, MD ?[place]  ? ?

## 2021-06-24 ENCOUNTER — Ambulatory Visit (INDEPENDENT_AMBULATORY_CARE_PROVIDER_SITE_OTHER): Payer: Medicare Other | Admitting: Family Medicine

## 2021-06-24 VITALS — BP 126/74 | HR 82 | Wt 202.8 lb

## 2021-06-24 DIAGNOSIS — E782 Mixed hyperlipidemia: Secondary | ICD-10-CM | POA: Diagnosis not present

## 2021-06-24 DIAGNOSIS — C61 Malignant neoplasm of prostate: Secondary | ICD-10-CM | POA: Diagnosis not present

## 2021-06-24 DIAGNOSIS — Z0000 Encounter for general adult medical examination without abnormal findings: Secondary | ICD-10-CM

## 2021-06-24 DIAGNOSIS — I10 Essential (primary) hypertension: Secondary | ICD-10-CM | POA: Diagnosis not present

## 2021-06-24 DIAGNOSIS — M1A9XX0 Chronic gout, unspecified, without tophus (tophi): Secondary | ICD-10-CM | POA: Diagnosis not present

## 2021-06-24 DIAGNOSIS — F4323 Adjustment disorder with mixed anxiety and depressed mood: Secondary | ICD-10-CM

## 2021-06-24 DIAGNOSIS — M109 Gout, unspecified: Secondary | ICD-10-CM

## 2021-06-24 DIAGNOSIS — E118 Type 2 diabetes mellitus with unspecified complications: Secondary | ICD-10-CM

## 2021-06-24 LAB — POCT GLYCOSYLATED HEMOGLOBIN (HGB A1C): HbA1c, POC (controlled diabetic range): 6.3 % (ref 0.0–7.0)

## 2021-06-24 NOTE — Patient Instructions (Addendum)
It was a pleasure to see you today! ? ?Your diabetes is well controlled today, 6.3%, same as 6 months ago. Continue metformin. ?Your blood pressure is well controlled. Continue lisinopril HCTZ. ?I will check your uric acid level for gout. We may increase allopurinol. Recommend staying away from heavy meats like livers, sausage, foie gras, pate, shell fish. You can have some of these, but the amount and frequency should be low ?I will check your cholesterol, liver and kidneys today ?Follow up in 6 months for routine blood pressure and diabetes check ? ? ?Be Well, ? ?Dr. Chauncey Reading ? ? ?Therapy and Counseling Resources ?Most providers on this list will take Medicaid. Patients with commercial insurance or Medicare should contact their insurance company to get a list of in network providers. ? ?[facility] (spanish speaking therapist available)(habla espanol)  ?[place], [place], [place] [address], [place] ?[email or web address] ?[phone] ? ?[facility] ?[address] [place], [place] [address] ?[phone] ? ?[facility] ? [address], [place], [place] [address]      [phone] ? ?[facility] ?[place], [place] [address] ?[phone] ? ?[facility] ?[address], [place], [place] [address]       [phone]    ? ?[facility] (virtual only) ?[phone] ? ?[facility] ?[address], [place], [place] ? ?[facility]:  [address] ? ?[facility]:  ?Calton Golds [phone] ? ?[facility] (under & uninsured) ?[address] [place] [phone]    [email or web address]   ? ?[facility] ?[address]  [place]    [phone] ? ?[facility] ?[place]      Phone:  [phone]     [place]  [phone]  ? ?[facility] ?[address] [place], [place] ext [phone] ? ?[facility]: [place], [place], [place]  ? ?[facility] (Shell therapist) [email or web address]  ?[address]   [place] [address]    [phone]   ? ?[facility]   ?[address] [place],  [place], [place]  ? ?[facility]  ?[address] [place]  [phone] ? ?[facility]  ?[place], [place]        [phone] ? ?Open Access/Walk In Clinic under & uninsured ? ?[facility]  ?[place], [place] ?[place] [phone] ?Crisis [phone] ? ?[facility] [place],  ?([place])   [place]: [phone]) 8:30 - 12; 1 - 2:30 ? ?[facility] [place],  ?[address], [place]    [phone]):8:30 - 12; 2 - 3PM ? ?[facility],  ?[address],  [place]; [phone]):   Mon - Fri 8 AM - 5 PM ? ?[facility] ?[place]  MWF 12:30 to 3:00 or call to schedule an appointment  [phone] ? ?Specific Provider options ?[facility]  [email or web address] ?click on find a therapist  ?enter your zip code ?left side and select or tailor a therapist for your specific need.  ? ?[facility] Provider Directory ?[email or web address]  (  Medicaid)   Follow all drop down to find a provider ? ?Social Support program ?[place] ?336) H3156881 or [email or web address] ?[address], [place], [place] [facility] support and educational  ? ?24- Hour Availability:  ? ?[facility]  ?[place], [place] ?[place] [phone] ?Crisis [phone] ? ?[facility] [employer]  [phone] ? ?[facility]  [phone]  ? ?[place]  [phone] (after hours) ? ?[facility]/Mobile Crisis   [phone] ? ?[place] National Suicide Hotline  786-293-8354 Diamantina Monks) ? ?Call 911 or go to emergency room ? ?[employer]  [phone]);  [place] and [place]  ? ?[facility]  ?([phone]); [place], [place], [place], [place], [place], [place], [place] ? ? ?

## 2021-06-25 DIAGNOSIS — F4320 Adjustment disorder, unspecified: Secondary | ICD-10-CM | POA: Insufficient documentation

## 2021-06-25 NOTE — Assessment & Plan Note (Signed)
Patient on 150 mg allopurinol. He recently had dietary indiscretions with chicken liver (his daughter was eating it for anemia), he did not realize this could precipitate gout. Will check uric acid. Could increase to 200 mg or stay at current dose. Discussed food and beverages that can precipitate gouty attacks. No tophi. ?

## 2021-06-25 NOTE — Assessment & Plan Note (Signed)
Will check UACR for protein. A1c well controlled at 6.3% today. Continue current regimen. ?

## 2021-06-25 NOTE — Assessment & Plan Note (Signed)
Chronic, controlled. No change to current regimen. Will check metabolic panel. ?

## 2021-06-25 NOTE — Assessment & Plan Note (Signed)
Patient is on atorvastatin 40 mg qd. Will repeat lipid panel and CMP today. ?

## 2021-06-25 NOTE — Assessment & Plan Note (Signed)
Patient is followed by [facility] urology. Recommend he ask them about support groups for other people with prostate cancer. ?

## 2021-06-25 NOTE — Assessment & Plan Note (Signed)
Patient has completed zoster series. He is UTD with ophthalmology. ?

## 2021-06-25 NOTE — Assessment & Plan Note (Signed)
Patient has significant stressors happening in his life, would like support from therapy. Patient has medicare, given resources to find a therapist. Discussed crisis resources. Encouraged to follow up if symptoms worsen or trouble finding therapy. ?

## 2021-06-26 LAB — COMPREHENSIVE METABOLIC PANEL
ALT: 39 IU/L (ref 0–44)
AST: 26 IU/L (ref 0–40)
Albumin/Globulin Ratio: 1.6 (ref 1.2–2.2)
Albumin: 4.7 g/dL (ref 3.7–4.7)
Alkaline Phosphatase: 60 IU/L (ref 44–121)
BUN/Creatinine Ratio: 11 (ref 10–24)
BUN: 16 mg/dL (ref 8–27)
Bilirubin Total: 0.5 mg/dL (ref 0.0–1.2)
CO2: 24 mmol/L (ref 20–29)
Calcium: 9.4 mg/dL (ref 8.6–10.2)
Chloride: 102 mmol/L (ref 96–106)
Creatinine, Ser: 1.4 mg/dL — ABNORMAL HIGH (ref 0.76–1.27)
Globulin, Total: 3 g/dL (ref 1.5–4.5)
Glucose: 116 mg/dL — ABNORMAL HIGH (ref 70–99)
Potassium: 4.5 mmol/L (ref 3.5–5.2)
Sodium: 141 mmol/L (ref 134–144)
Total Protein: 7.7 g/dL (ref 6.0–8.5)
eGFR: 54 mL/min/{1.73_m2} — ABNORMAL LOW (ref >59)

## 2021-06-26 LAB — LIPID PANEL
Chol/HDL Ratio: 2.6 ratio (ref 0.0–5.0)
Cholesterol, Total: 109 mg/dL (ref 100–199)
HDL: 42 mg/dL (ref >39)
LDL Chol Calc (NIH): 51 mg/dL (ref 0–99)
Triglycerides: 81 mg/dL (ref 0–149)
VLDL Cholesterol Cal: 16 mg/dL (ref 5–40)

## 2021-06-26 LAB — URIC ACID: Uric Acid: 8.8 mg/dL — ABNORMAL HIGH (ref 3.8–8.4)

## 2021-06-26 LAB — MICROALBUMIN / CREATININE URINE RATIO
Creatinine, Urine: 270.9 mg/dL
Microalb/Creat Ratio: 6 mg/g creat (ref 0–29)
Microalbumin, Urine: 16.2 ug/mL

## 2021-06-29 DIAGNOSIS — C61 Malignant neoplasm of prostate: Secondary | ICD-10-CM | POA: Diagnosis not present

## 2021-07-12 ENCOUNTER — Ambulatory Visit (INDEPENDENT_AMBULATORY_CARE_PROVIDER_SITE_OTHER): Payer: Medicare Other | Admitting: Family Medicine

## 2021-07-12 VITALS — BP 118/76 | HR 88 | Temp 98.7°F | Ht 66.0 in | Wt 199.1 lb

## 2021-07-12 DIAGNOSIS — J069 Acute upper respiratory infection, unspecified: Secondary | ICD-10-CM

## 2021-07-12 MED ORDER — BENZONATATE 100 MG PO CAPS: 100.0000 mg | ORAL_CAPSULE | Freq: Three times a day (TID) | ORAL | 0 refills | Status: DC | PRN

## 2021-07-12 NOTE — Patient Instructions (Addendum)
We are testing you for COVID and flu and I will let you know the results when it returns.  Your symptoms are most likely due to a virus.  I recommend continue use the honey and warm beverages throughout the day to help with your cough.  I am also sending in some Tessalon Perles that will help with that.  If you develop any shortness of breath, chest pains, or worsening of symptoms you need to be seen immediately.  If you are not improving in the next 2 days I would like for you to come back. ?

## 2021-07-12 NOTE — Progress Notes (Signed)
? ? ?  SUBJECTIVE:  ? ?CHIEF COMPLAINT / HPI:  ? ?Cough, chills, active fevers: ?71 year old male presenting with the above.  States it started Thursday with productive cough and congestion. He has had chills and has been waking up sweating.  His spouse noticed he had some increased work of breathing last night but states that this was in his sleep and he was simply breathing more shallow.  It did not wake him up.  He states he does have a history of sleep apnea at night.. No chest pains. No vomiting or diarrhea..  He has a 5-pack-year smoking history and quit in 1980.  He denies having any sick contacts. ? ?PERTINENT  PMH / PSH: Type 2 diabetes ? ?OBJECTIVE:  ? ?BP 118/76   Pulse 88   Temp 98.7 ?F (37.1 ?C) (Oral)   Wt 199 lb 2 oz (90.3 kg)   SpO2 97%   BMI 32.14 kg/m?   ? ?General: NAD, pleasant, able to participate in exam ?HEENT: No pharyngeal erythema, cervical lymphadenopathy ?Cardiac: RRR, no murmurs. ?Respiratory: No respiratory distress on room air.  He does have a cough in my presence.  His lungs are completely clear to auscultation in all fields with no wheezing, rales, rhonchi.   ?Abdomen: Nontender ?Extremities: No lower extremity edema ?Skin: warm and dry, no rashes noted ? ?ASSESSMENT/PLAN:  ? ?Viral URI with cough: ?Assessment: 71 y.o. male with cough, chills, subjective fevers.  His main concern is the cough.  This started on Thursday and has been going for about 4 days at this point.  He does not recall have any respiratory stress but his spouse did notice he was breathing more shallow last night.  It did not wake him from sleep.  He does have a history of sleep apnea.  On physical exam today he is well-appearing with lungs clear to auscultation.  Vitals within normal limits.  He does not have a history of COPD.  Does not have any wheezing suggestive of new asthma.  He does not have any lower extremity edema to suggest heart failure as the cause of his cough and would not explain his chills and  subjective fevers.  Discussed with him that this is most likely a virus given his clear lung fields.  Discussed symptomatic treatment.  Spouse did say they initially came in with a plan to get chest x-ray and I informed that I will be happy to order this but I did not think it was needed at this time given his lungs are clear.  They were in agreement with that and with shared decision making decided to come back in 2 days if he is not improving for reevaluation.  I discussed reasons to go to the ED or to follow-up sooner.  We will check for COVID and flu.  I will send in some Tessalon Perles for his cough and recommended using honey in addition ? ?Lurline Del, DO ?[place]  ? ?

## 2021-07-14 LAB — COVID-19, FLU A+B AND RSV
Influenza A, NAA: NOT DETECTED
Influenza B, NAA: NOT DETECTED
RSV, NAA: NOT DETECTED
SARS-CoV-2, NAA: DETECTED — AB

## 2021-07-29 DIAGNOSIS — C61 Malignant neoplasm of prostate: Secondary | ICD-10-CM | POA: Diagnosis not present

## 2021-07-29 DIAGNOSIS — Z20822 Contact with and (suspected) exposure to covid-19: Secondary | ICD-10-CM | POA: Diagnosis not present

## 2021-08-02 ENCOUNTER — Other Ambulatory Visit: Payer: Self-pay | Admitting: Family Medicine

## 2021-08-02 DIAGNOSIS — I10 Essential (primary) hypertension: Secondary | ICD-10-CM

## 2021-08-23 DIAGNOSIS — H43813 Vitreous degeneration, bilateral: Secondary | ICD-10-CM | POA: Diagnosis not present

## 2021-08-23 DIAGNOSIS — Z961 Presence of intraocular lens: Secondary | ICD-10-CM | POA: Diagnosis not present

## 2021-08-23 DIAGNOSIS — E119 Type 2 diabetes mellitus without complications: Secondary | ICD-10-CM | POA: Diagnosis not present

## 2021-08-30 ENCOUNTER — Encounter: Payer: Self-pay | Admitting: *Deleted

## 2021-08-30 ENCOUNTER — Encounter: Payer: Self-pay | Admitting: Family Medicine

## 2021-08-30 LAB — HM DIABETES EYE EXAM

## 2021-09-02 NOTE — Progress Notes (Signed)
Order(s) created erroneously. Erroneous order ID: [id number]  Order moved by: CHART CORRECTION ANALYST TWELVE, IDENTITY  Order move date/time: 09/02/2021 11:36 AM  Source Patient: [id number]  Source Contact: 08/30/2021  Destination Patient: [id number]  Destination Contact: 06/11/2012

## 2021-10-17 ENCOUNTER — Other Ambulatory Visit: Payer: Self-pay

## 2021-10-17 DIAGNOSIS — E119 Type 2 diabetes mellitus without complications: Secondary | ICD-10-CM

## 2021-10-18 MED ORDER — METFORMIN HCL 500 MG PO TABS: 500.0000 mg | ORAL_TABLET | Freq: Every day | ORAL | 1 refills | Status: DC

## 2021-10-25 DIAGNOSIS — N4231 Prostatic intraepithelial neoplasia: Secondary | ICD-10-CM | POA: Diagnosis not present

## 2021-10-25 DIAGNOSIS — C61 Malignant neoplasm of prostate: Secondary | ICD-10-CM | POA: Diagnosis not present

## 2021-10-31 DIAGNOSIS — G4733 Obstructive sleep apnea (adult) (pediatric): Secondary | ICD-10-CM | POA: Diagnosis not present

## 2021-11-03 DIAGNOSIS — C61 Malignant neoplasm of prostate: Secondary | ICD-10-CM | POA: Diagnosis not present

## 2021-11-25 DIAGNOSIS — H903 Sensorineural hearing loss, bilateral: Secondary | ICD-10-CM | POA: Diagnosis not present

## 2021-12-18 DIAGNOSIS — G4733 Obstructive sleep apnea (adult) (pediatric): Secondary | ICD-10-CM | POA: Diagnosis not present

## 2021-12-20 DIAGNOSIS — G4733 Obstructive sleep apnea (adult) (pediatric): Secondary | ICD-10-CM | POA: Diagnosis not present

## 2022-01-04 DIAGNOSIS — C61 Malignant neoplasm of prostate: Secondary | ICD-10-CM | POA: Diagnosis not present

## 2022-01-10 ENCOUNTER — Ambulatory Visit (INDEPENDENT_AMBULATORY_CARE_PROVIDER_SITE_OTHER): Payer: Medicare Other | Admitting: Student

## 2022-01-10 VITALS — BP 118/64 | HR 100 | Wt 203.0 lb

## 2022-01-10 DIAGNOSIS — M109 Gout, unspecified: Secondary | ICD-10-CM

## 2022-01-10 DIAGNOSIS — R7309 Other abnormal glucose: Secondary | ICD-10-CM | POA: Diagnosis not present

## 2022-01-10 DIAGNOSIS — R6883 Chills (without fever): Secondary | ICD-10-CM | POA: Diagnosis not present

## 2022-01-10 DIAGNOSIS — E118 Type 2 diabetes mellitus with unspecified complications: Secondary | ICD-10-CM

## 2022-01-10 DIAGNOSIS — Z23 Encounter for immunization: Secondary | ICD-10-CM

## 2022-01-10 LAB — POCT GLYCOSYLATED HEMOGLOBIN (HGB A1C): HbA1c, POC (prediabetic range): 6.2 % (ref 5.7–6.4)

## 2022-01-10 MED ORDER — COLCHICINE 0.6 MG PO TABS: 0.6000 mg | ORAL_TABLET | Freq: Every day | ORAL | 0 refills | Status: DC

## 2022-01-10 NOTE — Progress Notes (Signed)
    SUBJECTIVE:   CHIEF COMPLAINT / HPI:   Concern for Gout Flare Patient has a history of gout and presents today with concern for a gout flare in his L great toe. He has attacks fairly infrequently, one a year or so on average. He has a prescription for allopurinol but has been taking this just on an "as needed" basis when he has attacks due to chills/subjective fevers that he has when taking his allopurinol. This is a recurring issue for him. He started taking it late last week but stopped on Saturday due to the chills. He is concerned today because he was still having some chills last night despite having stopped the allopurinol two days prior. He has never had colchicine for a gout attack in the past. Has previously tried Naproxen but stopped due to kidney function (BL Cr 1.2-1.4).    OBJECTIVE:   BP 118/64   Pulse 100   Wt 203 lb (92.1 kg)   SpO2 98%   BMI 32.77 kg/m   Gen: Awake and engaged, non-toxic appearing Ext: Warmth and redness at base of the Left Great toe, minimal edema. No streaking or fluctuance. Able to move the joint, but this is painful. DP pulses 2+ bilaterally.  Neuro: Gait abnormal, favoring unaffected side   ASSESSMENT/PLAN:   Gout Seems to be having an acute gout flare of the L great toe. Other considerations include septic joint given concomitant chills/subjective fever, but he is systemically well-appearing and NAD. Also has not documented an objective fever. More likely chills related to allopurinol given that this is a recurring issue for him each time he takes the medication. Will discontinue allopurinol and give a course of colchicine now. Also sent extra colchicine for management of future flares. - D/c allopurinol - Colchicine 1.'2mg'$  today followed by 0.'6mg'$  daily to resolution - Strict return precautions reviewed for persistent fevers or worsening of systemic symptoms  Chills As above, most likely 2/2 allopurinol but also will be vigilant in monitoring  for signs of developing septic joint. Will also test for COVID per patient request.  Controlled diabetes mellitus type 2 with complications ([place]) U8Q 9.1%. Well-controlled today. Congratulated on success. Continue metformin for now, may consider d/cing metformin at PCP follow-up.      Pearla Dubonnet, MD [place]

## 2022-01-10 NOTE — Assessment & Plan Note (Signed)
Seems to be having an acute gout flare of the L great toe. Other considerations include septic joint given concomitant chills/subjective fever, but he is systemically well-appearing and NAD. Also has not documented an objective fever. More likely chills related to allopurinol given that this is a recurring issue for him each time he takes the medication. Will discontinue allopurinol and give a course of colchicine now. Also sent extra colchicine for management of future flares. - D/c allopurinol - Colchicine 1.'2mg'$  today followed by 0.'6mg'$  daily to resolution - Strict return precautions reviewed for persistent fevers or worsening of systemic symptoms

## 2022-01-10 NOTE — Assessment & Plan Note (Signed)
A1c 6.2%. Well-controlled today. Congratulated on success. Continue metformin for now, may consider d/cing metformin at PCP follow-up.

## 2022-01-10 NOTE — Assessment & Plan Note (Signed)
As above, most likely 2/2 allopurinol but also will be vigilant in monitoring for signs of developing septic joint. Will also test for COVID per patient request.

## 2022-01-10 NOTE — Patient Instructions (Addendum)
Jason Rhodes,  It is such a joy to meet you today! I'm so sorry that your gout is bothering you. We will treat this with a medication called colchicine. I want you to take one tablet as soon as you pick up the medication and then a second tablet one hour later. You should then take one tablet each day until the flare dies down over the next few days.  I am sending some extra tablets that you can keep on hand for future gout flares. If you are not feeling better by next Monday or if you develop fevers >100.4 or worsening chills despite stopping your allopurinol, please come back to see [place].  Your A1c was 6.2% today. Great work! Keep it up!   Pearla Dubonnet, MD

## 2022-01-12 LAB — NOVEL CORONAVIRUS, NAA: SARS-CoV-2, NAA: NOT DETECTED

## 2022-02-18 ENCOUNTER — Other Ambulatory Visit: Payer: Self-pay | Admitting: Family Medicine

## 2022-02-18 DIAGNOSIS — E785 Hyperlipidemia, unspecified: Secondary | ICD-10-CM

## 2022-02-21 DIAGNOSIS — G4733 Obstructive sleep apnea (adult) (pediatric): Secondary | ICD-10-CM | POA: Diagnosis not present

## 2022-03-22 ENCOUNTER — Other Ambulatory Visit: Payer: Self-pay

## 2022-03-22 DIAGNOSIS — I10 Essential (primary) hypertension: Secondary | ICD-10-CM

## 2022-03-22 MED ORDER — LISINOPRIL-HYDROCHLOROTHIAZIDE 10-12.5 MG PO TABS: 1.0000 | ORAL_TABLET | Freq: Every day | ORAL | 3 refills | Status: DC

## 2022-04-08 ENCOUNTER — Other Ambulatory Visit: Payer: Self-pay | Admitting: Family Medicine

## 2022-04-08 DIAGNOSIS — E119 Type 2 diabetes mellitus without complications: Secondary | ICD-10-CM

## 2022-05-05 ENCOUNTER — Other Ambulatory Visit: Payer: Self-pay | Admitting: Family Medicine

## 2022-05-05 DIAGNOSIS — E119 Type 2 diabetes mellitus without complications: Secondary | ICD-10-CM

## 2022-05-25 NOTE — Progress Notes (Signed)
    SUBJECTIVE:   CHIEF COMPLAINT / HPI: Medication refill  Knee Pain - Chronic, worsening over past several months.  Previously had x-rays of bilateral knees 4 years ago with similar pain.  Did not have any acute findings at that time.  Does not bother him all the time intermittent, worse with activity.  No acute changes including falls or stepping funny.  No clicking or locking out.  T2DM - Last A1c 6.2. Not checking at home. Taking metformin.   BP - Well controlled. Checking at home. 110s at home.  No chest pain shortness of breath  Gout - Patient states is well controlled with colchicine. Not currently having flare.   PERTINENT  PMH / PSH: HTN, OSA, T2DM, Bifascicular block, Prostate cancer [facility] urology)  OBJECTIVE:   BP 110/81   Pulse 89   Ht '5\' 6"'$  (1.676 m)   Wt 205 lb 8 oz (93.2 kg)   SpO2 98%   BMI 33.17 kg/m   General: NAD  Neuro: A&O Cardiovascular: RRR, no murmurs, no peripheral edema Respiratory: normal WOB on RA, CTAB, no wheezes, ronchi or rales MSK: Bilateral knees no obvious erythema, swelling, bruising, rash Mild palpable effusion along medial joint line bilateral knees Left knee-no tenderness palpation along joint line, patella, quadriceps tendon, patellar tendon, hamstring insertions tibial tuberosity, negative valgus and varus stress, negative Lachman's Extremities: Moving all 4 extremities equally   ASSESSMENT/PLAN:   Controlled type 2 diabetes mellitus with complication, without long-term current use of insulin (HCC) Assessment & Plan: Continues to be well-controlled.  A1c today 6.2.  Consider stopping metformin at next A1c check in 6 months if continues to be at goal.  Orders: -     POCT glycosylated hemoglobin (Hb A1C) -     metFORMIN HCl; Take 1 tablet (500 mg total) by mouth daily with breakfast.  Dispense: 30 tablet; Refill: 0  Essential hypertension Assessment & Plan: Well-controlled, refilled antihypertensives below.  Orders: -      amLODIPine Besylate; Take 1 tablet (10 mg total) by mouth daily.  Dispense: 90 tablet; Refill: 3 -     Lisinopril-hydroCHLOROthiazide; Take 1 tablet by mouth daily.  Dispense: 90 tablet; Refill: 3  Hyperlipidemia, unspecified hyperlipidemia type -     Atorvastatin Calcium; Take 1 tablet (40 mg total) by mouth daily.  Dispense: 90 tablet; Refill: 1  Primary osteoarthritis of left knee Assessment & Plan: Chronic knee pain worse with activity, and no inciting incident in the setting of normal clinical exam likely osteoarthritis.  Counseled patient on wear-and-tear on joints as people age.  Patient agreeable to plan of trialing PT exercises and handout given.  Also instructed to use Tylenol as needed for pain.  Discussed that further options available if pain continues to worsen.    Return in about 6 months (around 11/26/2022).  Salvadore Oxford, MD [place]

## 2022-05-26 ENCOUNTER — Ambulatory Visit (INDEPENDENT_AMBULATORY_CARE_PROVIDER_SITE_OTHER): Payer: Medicare Other | Admitting: Family Medicine

## 2022-05-26 VITALS — BP 110/81 | HR 89 | Ht 66.0 in | Wt 205.5 lb

## 2022-05-26 DIAGNOSIS — I10 Essential (primary) hypertension: Secondary | ICD-10-CM

## 2022-05-26 DIAGNOSIS — M1712 Unilateral primary osteoarthritis, left knee: Secondary | ICD-10-CM | POA: Diagnosis not present

## 2022-05-26 DIAGNOSIS — E118 Type 2 diabetes mellitus with unspecified complications: Secondary | ICD-10-CM | POA: Diagnosis not present

## 2022-05-26 DIAGNOSIS — E785 Hyperlipidemia, unspecified: Secondary | ICD-10-CM

## 2022-05-26 LAB — POCT GLYCOSYLATED HEMOGLOBIN (HGB A1C): HbA1c, POC (controlled diabetic range): 6.2 % (ref 0.0–7.0)

## 2022-05-26 MED ORDER — METFORMIN HCL 500 MG PO TABS: 500.0000 mg | ORAL_TABLET | Freq: Every day | ORAL | 0 refills | Status: DC

## 2022-05-26 MED ORDER — AMLODIPINE BESYLATE 10 MG PO TABS: 10.0000 mg | ORAL_TABLET | Freq: Every day | ORAL | 3 refills | Status: DC

## 2022-05-26 MED ORDER — ATORVASTATIN CALCIUM 40 MG PO TABS: 40.0000 mg | ORAL_TABLET | Freq: Every day | ORAL | 1 refills | Status: DC

## 2022-05-26 MED ORDER — LISINOPRIL-HYDROCHLOROTHIAZIDE 10-12.5 MG PO TABS: 1.0000 | ORAL_TABLET | Freq: Every day | ORAL | 3 refills | Status: DC

## 2022-05-26 NOTE — Assessment & Plan Note (Signed)
Well-controlled, refilled antihypertensives below.

## 2022-05-26 NOTE — Assessment & Plan Note (Signed)
Continues to be well-controlled.  A1c today 6.2.  Consider stopping metformin at next A1c check in 6 months if continues to be at goal.

## 2022-05-26 NOTE — Assessment & Plan Note (Signed)
Chronic knee pain worse with activity, and no inciting incident in the setting of normal clinical exam likely osteoarthritis.  Counseled patient on wear-and-tear on joints as people age.  Patient agreeable to plan of trialing PT exercises and handout given.  Also instructed to use Tylenol as needed for pain.  Discussed that further options available if pain continues to worsen.

## 2022-05-26 NOTE — Patient Instructions (Addendum)
It was great to see you! Thank you for allowing me to participate in your care!  I recommend that you always bring your medications to each appointment as this makes it easy to ensure we are on the correct medications and helps [place] not miss when refills are needed.  Our plans for today:  - I have refilled your medications. Please continue taking them daily. - Your A1c is 6.2. This is great! - I have printed out a hand out for physical therapy for your knee. If this does not help please make an appointment to discuss further treatment.    Please arrive 15 minutes PRIOR to your next scheduled appointment time! If you do not, this affects OTHER patients' care.  Take care and seek immediate care sooner if you develop any concerns.   Dr. Salvadore Oxford, MD [place]

## 2022-06-24 ENCOUNTER — Other Ambulatory Visit: Payer: Self-pay | Admitting: Family Medicine

## 2022-06-24 DIAGNOSIS — E118 Type 2 diabetes mellitus with unspecified complications: Secondary | ICD-10-CM

## 2022-07-12 DIAGNOSIS — N400 Enlarged prostate without lower urinary tract symptoms: Secondary | ICD-10-CM | POA: Diagnosis not present

## 2022-07-12 DIAGNOSIS — C61 Malignant neoplasm of prostate: Secondary | ICD-10-CM | POA: Diagnosis not present

## 2022-08-02 ENCOUNTER — Telehealth: Payer: Self-pay | Admitting: Family Medicine

## 2022-08-02 NOTE — Telephone Encounter (Signed)
Called patient to schedule Medicare Annual Wellness Visit (AWV). Left message for patient to call back and schedule Medicare Annual Wellness Visit (AWV).  Last date of AWV: 05/31/2021   Please schedule an AWVS appointment at any time with [facility] ANNUAL WELLNESS VISIT.  If any questions, please contact me at [phone].    Thank you,  Christoffer Currier  Ambulatory Clinic Support [facility] Direct dial  [phone]   

## 2022-08-18 ENCOUNTER — Ambulatory Visit (INDEPENDENT_AMBULATORY_CARE_PROVIDER_SITE_OTHER): Payer: Medicare Other

## 2022-08-18 VITALS — Ht 66.0 in | Wt 205.0 lb

## 2022-08-18 DIAGNOSIS — Z0000 Encounter for general adult medical examination without abnormal findings: Secondary | ICD-10-CM | POA: Diagnosis not present

## 2022-08-18 NOTE — Progress Notes (Signed)
Subjective:   Jason Rhodes is a 72 y.o. male who presents for Medicare Annual/Subsequent preventive examination.  I connected with  Jason Rhodes on 08/18/22 by a audio enabled telemedicine application and verified that I am speaking with the correct person using two identifiers.  Patient Location: Home  Provider Location: Home Office  I discussed the limitations of evaluation and management by telemedicine. The patient expressed understanding and agreed to proceed.  Review of Systems     Cardiac Risk Factors include: advanced age (>43men, >69 women);diabetes mellitus;dyslipidemia;male gender;hypertension     Objective:    Today's Vitals   08/18/22 1004  Weight: 205 lb (93 kg)  Height: 5\' 6"  (1.676 m)   Body mass index is 33.09 kg/m.     08/18/2022    9:57 AM 05/26/2022    2:19 PM 01/10/2022    8:34 AM 05/31/2021    3:16 PM 12/07/2020    2:09 PM 01/30/2020    1:34 PM 09/26/2019    9:47 AM  Advanced Directives  Does Patient Have a Medical Advance Directive? No No No No No No No  Would patient like information on creating a medical advance directive? Yes (MAU/Ambulatory/Procedural Areas - Information given) No - Patient declined No - Patient declined Yes (MAU/Ambulatory/Procedural Areas - Information given) No - Patient declined No - Patient declined No - Patient declined    Current Medications (verified) Outpatient Encounter Medications as of 08/18/2022  Medication Sig   amLODipine (NORVASC) 10 MG tablet Take 1 tablet (10 mg total) by mouth daily.   atorvastatin (LIPITOR) 40 MG tablet Take 1 tablet (40 mg total) by mouth daily.   Blood Glucose Monitoring Suppl (ONE TOUCH ULTRA 2) W/DEVICE KIT Check sugars at least 3 times daily or as directed by provider   colchicine 0.6 MG tablet Take 1 tablet (0.6 mg total) by mouth daily. Take a double dose on the first day. Take the first tablet, and then one hour later, take a second. Then start taking one tablet per day until the flare  resolves.   glucose blood test strip Use One test strip to check glucose level 3 times daily.  ICD-10 code: E11.9.  Use One Touch Ultra Blue Test Strips.   lisinopril-hydrochlorothiazide (ZESTORETIC) 10-12.5 MG tablet Take 1 tablet by mouth daily.   metFORMIN (GLUCOPHAGE) 500 MG tablet Take 1 tablet by mouth once daily with breakfast   ONETOUCH DELICA LANCETS 33G MISC Use to test blood glucose 3 times daily. ICD-10 code: E11.9.   [DISCONTINUED] naproxen (NAPROSYN) 500 MG tablet Take 1 tablet (500 mg total) by mouth 2 (two) times daily with a meal.   No facility-administered encounter medications on file as of 08/18/2022.    Allergies (verified) Patient has no known allergies.   History: Past Medical History:  Diagnosis Date   Bifascicular block    BPH (benign prostatic hyperplasia) 08/27/2012   Cancer (HCC) 06/11/2019   Prostate   COVID-19 virus infection 09/27/2019   Diabetes mellitus without complication (HCC)    Dizziness    Essential hypertension 08/27/2012   Exposure to COVID-19 virus 09/23/2019   Hyperlipidemia    Left shoulder pain 04/03/2019   Neuropathy, cervical (radicular) 01/20/2015   New onset type 2 diabetes mellitus (HCC) 02/23/2015   OSA (obstructive sleep apnea) 02/13/2014   Sleep study 05/2014 - severe OSA; on CPAP    Painless rectal bleeding 08/29/2018   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 Active surveillance with oncology at [facility], Dr. Shea Evans. Most recent PSAs ~3.   Right knee pain 01/30/2017   Sickle cell trait (HCC) 01/10/2013   Per patient report    Past Surgical History:  Procedure Laterality Date   CATARACT EXTRACTION, BILATERAL  2023   Dr. Burgess Estelle   HERNIA REPAIR     PROSTATE SURGERY     SHOULDER SURGERY     Family History  Problem Relation Age of Onset   Diabetes Mother     Hypertension Mother    Diabetes Father    Hypertension Father    Hypertension Brother    Diabetes Brother    Diabetes Brother    Social History   Socioeconomic History   Marital status: Married    Spouse name: Jason Rhodes    Number of children: 3   Years of education: 18   Highest education level: Manufacturing engineer (e.g., MA, MS, MEng, MEd, MSW, MBA)  Occupational History   Occupation: Retired  Tobacco Use   Smoking status: Former    Packs/day: 1.00    Years: 5.00    Additional pack years: 0.00    Total pack years: 5.00    Types: Cigarettes    Quit date: 1980    Years since quitting: 44.4    Passive exposure: Past   Smokeless tobacco: Never  Vaping Use   Vaping Use: Never used  Substance and Sexual Activity   Alcohol use: No   Drug use: No   Sexual activity: Yes  Other Topics Concern   Not on file  Social History Narrative   Patient lives with his wife Jason Rhodes [facility] patient.)    Patient enjoys working out with his wife at [employer].   Exercises ~daily for 1 hour.   Owned delivery business- now fully retired.   Patient enjoys spending time with his family, his 3 children.    Social Determinants of Health   Financial Resource Strain: Low Risk  (08/18/2022)   Overall Financial Resource Strain (CARDIA)    Difficulty of Paying Living Expenses: Not hard at all  Food Insecurity: No Food Insecurity (08/18/2022)   Hunger Vital Sign    Worried About Running Out of Food in the Last Year: Never true    Ran Out of Food in the Last Year: Never true  Transportation Needs: No Transportation Needs (08/18/2022)   PRAPARE - Administrator, Civil Service (Medical): No    Lack of Transportation (Non-Medical): No  Physical Activity: Sufficiently Active (08/18/2022)   Exercise Vital Sign    Days of Exercise per Week: 7 days    Minutes of Exercise per Session: 60 min  Stress: No Stress Concern Present (08/18/2022)   [employer] of Occupational Health - Occupational Stress  Questionnaire    Feeling of Stress : Not at all  Social Connections: Moderately Integrated (08/18/2022)   Social Connection and Isolation Panel [NHANES]    Frequency of Communication with Friends and Family: More than three times a week    Frequency of Social Gatherings with Friends and Family: Three times a week    Attends Religious Services: More than 4 times per year    Active Member of Clubs or Organizations: No    Attends Banker Meetings: Never    Marital Status: Married    Tobacco Counseling  Counseling given: Not Answered   Clinical Intake:  Pre-visit preparation completed: Yes  Pain : No/denies pain  Diabetes: Yes CBG done?: No Did pt. bring in CBG monitor from home?: No  How often do you need to have someone help you when you read instructions, pamphlets, or other written materials from your doctor or pharmacy?: 1 - Never  Diabetic?Yes   Nutrition Risk Assessment:  Has the patient had any N/V/D within the last 2 months?  No  Does the patient have any non-healing wounds?  No  Has the patient had any unintentional weight loss or weight gain?  No   Diabetes:  Is the patient diabetic?  Yes  If diabetic, was a CBG obtained today?  No  Did the patient bring in their glucometer from home?  No  How often do you monitor your CBG's? daily.   Financial Strains and Diabetes Management:  Are you having any financial strains with the device, your supplies or your medication? No .  Does the patient want to be seen by Chronic Care Management for management of their diabetes?  No  Would the patient like to be referred to a Nutritionist or for Diabetic Management?  No   Diabetic Exams:  Diabetic Eye Exam: Completed 08/30/21 Diabetic Foot Exam: Overdue, Pt has been advised about the importance in completing this exam. Pt is scheduled for diabetic foot exam on at next office visit .   Interpreter Needed?: No  Information entered by :: Kandis Fantasia  LPN   Activities of Daily Living    08/18/2022   10:12 AM  In your present state of health, do you have any difficulty performing the following activities:  Hearing? 0  Vision? 0  Difficulty concentrating or making decisions? 0  Walking or climbing stairs? 0  Dressing or bathing? 0  Doing errands, shopping? 0  Preparing Food and eating ? N  Using the Toilet? N  In the past six months, have you accidently leaked urine? N  Do you have problems with loss of bowel control? N  Managing your Medications? N  Managing your Finances? N  Housekeeping or managing your Housekeeping? N    Patient Care Team: Celine Mans, MD as PCP - General (Family Medicine) Lyn Records, MD (Inactive) as Consulting Physician (Cardiology) Sharrell Ku, MD as Consulting Physician (Gastroenterology) Shea Evans Garry Heater, NP as Nurse Practitioner (Nurse Practitioner) Janet Berlin, MD as Consulting Physician (Ophthalmology) Verne Spurr, Alita Chyle, MD as Referring Physician (Pediatrics)  Indicate any recent Medical Services you may have received from other than [facility] providers in the past year (date may be approximate).     Assessment:   This is a routine wellness examination for Inti.  Hearing/Vision screen Hearing Screening - Comments:: Hearing loss; seen by audiology  Vision Screening - Comments:: Wears rx glasses - up to date with routine eye exams with [facility]     Dietary issues and exercise activities discussed: Current Exercise Habits: Home exercise routine, Type of exercise: walking, Time (Minutes): 60, Frequency (Times/Week): 7, Weekly Exercise (Minutes/Week): 420, Intensity: Mild   Goals Addressed             This Visit's Progress    Remain active and independent        Depression Screen    08/18/2022   10:08 AM 05/26/2022    2:20 PM 05/26/2022    2:17 PM 01/10/2022    8:34 AM 06/24/2021   11:23 AM 05/31/2021    3:14 PM 12/07/2020  2:10 PM  PHQ 2/9 Scores   PHQ - 2 Score 0 0 0 0 0 0 0  PHQ- 9 Score  0 0 0 0  0    Fall Risk    08/18/2022   10:07 AM 05/26/2022    2:19 PM 01/10/2022    8:34 AM 06/24/2021   11:24 AM 05/31/2021    3:17 PM  Fall Risk   Falls in the past year? 0 0 0 0 0  Number falls in past yr: 0 0 0  0  Injury with Fall? 0 0 0  0  Risk for fall due to : No Fall Risks  No Fall Risks  History of fall(s)  Follow up Falls prevention discussed;Education provided;Falls evaluation completed        FALL RISK PREVENTION PERTAINING TO THE HOME:  Any stairs in or around the home? Yes  If so, are there any without handrails? No  Home free of loose throw rugs in walkways, pet beds, electrical cords, etc? Yes  Adequate lighting in your home to reduce risk of falls? Yes   ASSISTIVE DEVICES UTILIZED TO PREVENT FALLS:  Life alert? No  Use of a cane, walker or w/c? No  Grab bars in the bathroom? Yes  Shower chair or bench in shower? No  Elevated toilet seat or a handicapped toilet? Yes   TIMED UP AND GO:  Was the test performed? No . Telephonic visit   Cognitive Function:        08/18/2022   10:12 AM 05/31/2021    3:18 PM 06/17/2019   10:15 AM  6CIT Screen  What Year? 0 points 0 points 0 points  What month? 0 points 0 points 0 points  What time? 0 points 0 points 0 points  Count back from 20 0 points 0 points 0 points  Months in reverse 0 points 0 points 2 points  Repeat phrase 0 points 0 points 0 points  Total Score 0 points 0 points 2 points    Immunizations Immunization History  Administered Date(s) Administered   Fluad Quad(high Dose 65+) 12/07/2020, 01/10/2022   Influenza,inj,Quad PF,6+ Mos 01/08/2013, 02/13/2014, 12/07/2014, 12/29/2016   Influenza-Unspecified 12/16/2015   PFIZER Comirnaty(Gray Top)Covid-19 Tri-Sucrose Vaccine 08/30/2020   PFIZER(Purple Top)SARS-COV-2 Vaccination 01/30/2020, 02/27/2020   Pfizer Covid-19 Vaccine Bivalent Booster 88yrs & up 02/01/2021   Pneumococcal Conjugate-13 06/09/2015    Pneumococcal Polysaccharide-23 12/07/2020   Tdap 01/08/2013   Zoster Recombinat (Shingrix) 05/25/2020, 07/25/2020   Zoster, Live 06/18/2015    TDAP status: Up to date  Pneumococcal vaccine status: Up to date  Covid-19 vaccine status: Information provided on how to obtain vaccines.   Qualifies for Shingles Vaccine? Yes   Zostavax completed Yes   Shingrix Completed?: Yes  Screening Tests Health Maintenance  Topic Date Due   COVID-19 Vaccine (5 - 2023-24 season) 11/25/2021   Diabetic kidney evaluation - eGFR measurement  06/25/2022   Diabetic kidney evaluation - Urine ACR  06/25/2022   FOOT EXAM  06/25/2022   OPHTHALMOLOGY EXAM  08/31/2022   INFLUENZA VACCINE  10/26/2022   HEMOGLOBIN A1C  11/26/2022   DTaP/Tdap/Td (2 - Td or Tdap) 01/09/2023   Medicare Annual Wellness (AWV)  08/18/2023   Colonoscopy  01/19/2025   Pneumonia Vaccine 9+ Years old  Completed   Hepatitis C Screening  Completed   Zoster Vaccines- Shingrix  Completed   HPV VACCINES  Aged Out    Health Maintenance  Health Maintenance Due  Topic Date Due   COVID-19  Vaccine (5 - 2023-24 season) 11/25/2021   Diabetic kidney evaluation - eGFR measurement  06/25/2022   Diabetic kidney evaluation - Urine ACR  06/25/2022   FOOT EXAM  06/25/2022    Colorectal cancer screening: Type of screening: Colonoscopy. Completed 01/20/20. Repeat every 5 years  Lung Cancer Screening: (Low Dose CT Chest recommended if Age 95-80 years, 30 pack-year currently smoking OR have quit w/in 15years.) does not qualify.   Lung Cancer Screening Referral: n/a  Additional Screening:  Hepatitis C Screening: does qualify; Completed 06/09/15  Vision Screening: Recommended annual ophthalmology exams for early detection of glaucoma and other disorders of the eye. Is the patient up to date with their annual eye exam?  Yes  Who is the provider or what is the name of the office in which the patient attends annual eye exams? [facility]hthalmology  If pt is not established with a provider, would they like to be referred to a provider to establish care? No .   Dental Screening: Recommended annual dental exams for proper oral hygiene  Community Resource Referral / Chronic Care Management: CRR required this visit?  No   CCM required this visit?  No      Plan:     I have personally reviewed and noted the following in the patient's chart:   Medical and social history Use of alcohol, tobacco or illicit drugs  Current medications and supplements including opioid prescriptions. Patient is not currently taking opioid prescriptions. Functional ability and status Nutritional status Physical activity Advanced directives List of other physicians Hospitalizations, surgeries, and ER visits in previous 12 months Vitals Screenings to include cognitive, depression, and falls Referrals and appointments  In addition, I have reviewed and discussed with patient certain preventive protocols, quality metrics, and best practice recommendations. A written personalized care plan for preventive services as well as general preventive health recommendations were provided to patient.     Durwin Nora, [place]   1/61/0960   Due to this being a virtual visit, the after visit summary with patients personalized plan was offered to patient via mail or my-chart.  Patient would like to access on my-chart  Nurse Notes: See telephone note

## 2022-08-18 NOTE — Patient Instructions (Addendum)
Jason Rhodes , Thank you for taking time to come for your Medicare Wellness Visit. I appreciate your ongoing commitment to your health goals. Please review the following plan we discussed and let me know if I can assist you in the future.   These are the goals we discussed:  Goals      HEMOGLOBIN A1C < 6     Maintain A1c around 5.5.  A1c 6.2 in 11/2020.     Remain active and independent        This is a list of the screening recommended for you and due dates:  Health Maintenance  Topic Date Due   COVID-19 Vaccine (5 - 2023-24 season) 11/25/2021   Yearly kidney function blood test for diabetes  06/25/2022   Yearly kidney health urinalysis for diabetes  06/25/2022   Complete foot exam   06/25/2022   Eye exam for diabetics  08/31/2022   Flu Shot  10/26/2022   Hemoglobin A1C  11/26/2022   DTaP/Tdap/Td vaccine (2 - Td or Tdap) 01/09/2023   Medicare Annual Wellness Visit  08/18/2023   Colon Cancer Screening  01/19/2025   Pneumonia Vaccine  Completed   Hepatitis C Screening  Completed   Zoster (Shingles) Vaccine  Completed   HPV Vaccine  Aged Out    Advanced directives: Information on Advanced Care Planning can be found at [facility] Advance Health Care Directives Advance Health Care Directives ([email or web address]  Please bring a copy of your health care power of attorney and living will to the office to be added to your chart at your convenience.   Conditions/risks identified: Aim for 30 minutes of exercise or brisk walking, 6-8 glasses of water, and 5 servings of fruits and vegetables each day.   Next appointment: Follow up in one year for your annual wellness visit.   Preventive Care 73 Years and Older, Male  Preventive care refers to lifestyle choices and visits with your health care provider that can promote health and wellness. What does preventive care include? A yearly physical exam. This is also called an annual well check. Dental exams once or twice a  year. Routine eye exams. Ask your health care provider how often you should have your eyes checked. Personal lifestyle choices, including: Daily care of your teeth and gums. Regular physical activity. Eating a healthy diet. Avoiding tobacco and drug use. Limiting alcohol use. Practicing safe sex. Taking low doses of aspirin every day. Taking vitamin and mineral supplements as recommended by your health care provider. What happens during an annual well check? The services and screenings done by your health care provider during your annual well check will depend on your age, overall health, lifestyle risk factors, and family history of disease. Counseling  Your health care provider may ask you questions about your: Alcohol use. Tobacco use. Drug use. Emotional well-being. Home and relationship well-being. Sexual activity. Eating habits. History of falls. Memory and ability to understand (cognition). Work and work Astronomer. Screening  You may have the following tests or measurements: Height, weight, and BMI. Blood pressure. Lipid and cholesterol levels. These may be checked every 5 years, or more frequently if you are over 75 years old. Skin check. Lung cancer screening. You may have this screening every year starting at age 71 if you have a 30-pack-year history of smoking and currently smoke or have quit within the past 15 years. Fecal occult blood test (FOBT) of the stool. You may have this test every year starting at  age 53. Flexible sigmoidoscopy or colonoscopy. You may have a sigmoidoscopy every 5 years or a colonoscopy every 10 years starting at age 64. Prostate cancer screening. Recommendations will vary depending on your family history and other risks. Hepatitis C blood test. Hepatitis B blood test. Sexually transmitted disease (STD) testing. Diabetes screening. This is done by checking your blood sugar (glucose) after you have not eaten for a while (fasting). You may  have this done every 1-3 years. Abdominal aortic aneurysm (AAA) screening. You may need this if you are a current or former smoker. Osteoporosis. You may be screened starting at age 33 if you are at high risk. Talk with your health care provider about your test results, treatment options, and if necessary, the need for more tests. Vaccines  Your health care provider may recommend certain vaccines, such as: Influenza vaccine. This is recommended every year. Tetanus, diphtheria, and acellular pertussis (Tdap, Td) vaccine. You may need a Td booster every 10 years. Zoster vaccine. You may need this after age 33. Pneumococcal 13-valent conjugate (PCV13) vaccine. One dose is recommended after age 16. Pneumococcal polysaccharide (PPSV23) vaccine. One dose is recommended after age 27. Talk to your health care provider about which screenings and vaccines you need and how often you need them. This information is not intended to replace advice given to you by your health care provider. Make sure you discuss any questions you have with your health care provider. Document Released: 04/09/2015 Document Revised: 12/01/2015 Document Reviewed: 01/12/2015 Elsevier Interactive Patient Education  2017 [employer].  Fall Prevention in the Home Falls can cause injuries. They can happen to people of all ages. There are many things you can do to make your home safe and to help prevent falls. What can I do on the outside of my home? Regularly fix the edges of walkways and driveways and fix any cracks. Remove anything that might make you trip as you walk through a door, such as a raised step or threshold. Trim any bushes or trees on the path to your home. Use bright outdoor lighting. Clear any walking paths of anything that might make someone trip, such as rocks or tools. Regularly check to see if handrails are loose or broken. Make sure that both sides of any steps have handrails. Any raised decks and porches  should have guardrails on the edges. Have any leaves, snow, or ice cleared regularly. Use sand or salt on walking paths during winter. Clean up any spills in your garage right away. This includes oil or grease spills. What can I do in the bathroom? Use night lights. Install grab bars by the toilet and in the tub and shower. Do not use towel bars as grab bars. Use non-skid mats or decals in the tub or shower. If you need to sit down in the shower, use a plastic, non-slip stool. Keep the floor dry. Clean up any water that spills on the floor as soon as it happens. Remove soap buildup in the tub or shower regularly. Attach bath mats securely with double-sided non-slip rug tape. Do not have throw rugs and other things on the floor that can make you trip. What can I do in the bedroom? Use night lights. Make sure that you have a light by your bed that is easy to reach. Do not use any sheets or blankets that are too big for your bed. They should not hang down onto the floor. Have a firm chair that has side arms. You can  use this for support while you get dressed. Do not have throw rugs and other things on the floor that can make you trip. What can I do in the kitchen? Clean up any spills right away. Avoid walking on wet floors. Keep items that you use a lot in easy-to-reach places. If you need to reach something above you, use a strong step stool that has a grab bar. Keep electrical cords out of the way. Do not use floor polish or wax that makes floors slippery. If you must use wax, use non-skid floor wax. Do not have throw rugs and other things on the floor that can make you trip. What can I do with my stairs? Do not leave any items on the stairs. Make sure that there are handrails on both sides of the stairs and use them. Fix handrails that are broken or loose. Make sure that handrails are as long as the stairways. Check any carpeting to make sure that it is firmly attached to the stairs.  Fix any carpet that is loose or worn. Avoid having throw rugs at the top or bottom of the stairs. If you do have throw rugs, attach them to the floor with carpet tape. Make sure that you have a light switch at the top of the stairs and the bottom of the stairs. If you do not have them, ask someone to add them for you. What else can I do to help prevent falls? Wear shoes that: Do not have high heels. Have rubber bottoms. Are comfortable and fit you well. Are closed at the toe. Do not wear sandals. If you use a stepladder: Make sure that it is fully opened. Do not climb a closed stepladder. Make sure that both sides of the stepladder are locked into place. Ask someone to hold it for you, if possible. Clearly mark and make sure that you can see: Any grab bars or handrails. First and last steps. Where the edge of each step is. Use tools that help you move around (mobility aids) if they are needed. These include: Canes. Walkers. Scooters. Crutches. Turn on the lights when you go into a dark area. Replace any light bulbs as soon as they burn out. Set up your furniture so you have a clear path. Avoid moving your furniture around. If any of your floors are uneven, fix them. If there are any pets around you, be aware of where they are. Review your medicines with your doctor. Some medicines can make you feel dizzy. This can increase your chance of falling. Ask your doctor what other things that you can do to help prevent falls. This information is not intended to replace advice given to you by your health care provider. Make sure you discuss any questions you have with your health care provider. Document Released: 01/07/2009 Document Revised: 08/19/2015 Document Reviewed: 04/17/2014 Elsevier Interactive Patient Education  2017 [employer].

## 2022-08-22 ENCOUNTER — Encounter: Payer: Self-pay | Admitting: Family Medicine

## 2022-08-23 NOTE — Progress Notes (Signed)
SUBJECTIVE:   Chief compliant/HPI: annual examination  Jason Rhodes is a 72 y.o. who presents today for an annual exam.   History tabs reviewed and updated.   Review of systems form reviewed.  OBJECTIVE:   BP 128/79   Pulse 73   Ht 5\' 6"  (1.676 m)   Wt 203 lb (92.1 kg)   SpO2 100%   BMI 32.77 kg/m   Physical Exam Vitals reviewed.  Constitutional:      General: He is not in acute distress.    Appearance: Normal appearance. He is normal weight.  HENT:     Head: Normocephalic and atraumatic.     Right Ear: External ear normal.     Left Ear: External ear normal.     Nose: Nose normal.     Mouth/Throat:     Mouth: Mucous membranes are moist.  Eyes:     Extraocular Movements: Extraocular movements intact.     Conjunctiva/sclera: Conjunctivae normal.     Pupils: Pupils are equal, round, and reactive to light.  Cardiovascular:     Rate and Rhythm: Normal rate and regular rhythm.     Pulses: Normal pulses.     Heart sounds: Normal heart sounds. No murmur heard. Pulmonary:     Effort: Pulmonary effort is normal. No respiratory distress.     Breath sounds: Normal breath sounds.  Abdominal:     General: There is no distension.     Palpations: Abdomen is soft.     Tenderness: There is no abdominal tenderness. There is no guarding.  Musculoskeletal:     Cervical back: Normal range of motion and neck supple.     Right lower leg: No edema.     Left lower leg: No edema.  Skin:    General: Skin is warm and dry.  Neurological:     General: No focal deficit present.     Mental Status: He is alert and oriented to person, place, and time.  Psychiatric:        Mood and Affect: Mood normal.        Behavior: Behavior normal.      ASSESSMENT/PLAN:   Annual physical exam -     Hepatic function panel -     Lipid panel -     Ambulatory referral to Gastroenterology  Essential hypertension Assessment & Plan: Controlled today. Continue Amlodipine and  Zestoretic.  Orders: -     Basic metabolic panel  Controlled type 2 diabetes mellitus with complication, without long-term current use of insulin (HCC) -     Microalbumin / creatinine urine ratio  Prostate cancer [facility]) Assessment & Plan: Per last Urology note. PSA okay. Consider MRI later this year. Patient would like another lab recheck today.  Orders: -     PSA  Screening for colon cancer -     Ambulatory referral to Gastroenterology    Annual Examination  See AVS for age appropriate recommendations.  PHQ score 0, reviewed and discussed.  Blood pressure value is  goal, discussed.   Considered the following screening exams based upon USPSTF recommendations: Diabetes screening:  A1c 6.2 05/26/22 Screening for elevated cholesterol: discussed and ordered HIV testing: discussed Hepatitis C: discussed Hepatitis B: discussed Syphilis if at high risk: discussed Reviewed risk factors for latent tuberculosis and not indicated Colorectal cancer screening: discussed, colonoscopy ordered, per chart review requires every 3 years Lung cancer screening: discussed, not indicated. PSA discussed, patient sees Urology for prostate cancer.  Follow up in 1  year or sooner if indicated.    Celine Mans, MD [facility]

## 2022-08-24 ENCOUNTER — Ambulatory Visit (INDEPENDENT_AMBULATORY_CARE_PROVIDER_SITE_OTHER): Payer: Medicare Other | Admitting: Family Medicine

## 2022-08-24 ENCOUNTER — Encounter: Payer: Self-pay | Admitting: Family Medicine

## 2022-08-24 VITALS — BP 128/79 | HR 73 | Ht 66.0 in | Wt 203.0 lb

## 2022-08-24 DIAGNOSIS — I10 Essential (primary) hypertension: Secondary | ICD-10-CM | POA: Diagnosis not present

## 2022-08-24 DIAGNOSIS — Z1211 Encounter for screening for malignant neoplasm of colon: Secondary | ICD-10-CM | POA: Diagnosis not present

## 2022-08-24 DIAGNOSIS — E118 Type 2 diabetes mellitus with unspecified complications: Secondary | ICD-10-CM

## 2022-08-24 DIAGNOSIS — C61 Malignant neoplasm of prostate: Secondary | ICD-10-CM | POA: Diagnosis not present

## 2022-08-24 DIAGNOSIS — Z0000 Encounter for general adult medical examination without abnormal findings: Secondary | ICD-10-CM | POA: Diagnosis not present

## 2022-08-24 NOTE — Assessment & Plan Note (Signed)
Per last Urology note. PSA okay. Consider MRI later this year. Patient would like another lab recheck today.

## 2022-08-24 NOTE — Patient Instructions (Addendum)
It was great to see you! Thank you for allowing me to participate in your care!  I recommend that you always bring your medications to each appointment as this makes it easy to ensure we are on the correct medications and helps [place] not miss when refills are needed.  Our plans for today:  - I have referred you for Colonoscopy. - I will let you know about your labs.  Please arrive 15 minutes PRIOR to your next scheduled appointment time! If you do not, this affects OTHER patients' care.  Take care and seek immediate care sooner if you develop any concerns.   Dr. Celine Mans, MD [facility]

## 2022-08-24 NOTE — Assessment & Plan Note (Signed)
Controlled today. Continue Amlodipine and Zestoretic.

## 2022-08-25 LAB — LIPID PANEL
Chol/HDL Ratio: 2.5 ratio (ref 0.0–5.0)
Cholesterol, Total: 115 mg/dL (ref 100–199)
HDL: 46 mg/dL (ref >39)
LDL Chol Calc (NIH): 53 mg/dL (ref 0–99)
Triglycerides: 79 mg/dL (ref 0–149)
VLDL Cholesterol Cal: 16 mg/dL (ref 5–40)

## 2022-08-25 LAB — BASIC METABOLIC PANEL
BUN/Creatinine Ratio: 8 — ABNORMAL LOW (ref 10–24)
BUN: 11 mg/dL (ref 8–27)
CO2: 24 mmol/L (ref 20–29)
Calcium: 9.7 mg/dL (ref 8.6–10.2)
Chloride: 100 mmol/L (ref 96–106)
Creatinine, Ser: 1.32 mg/dL — ABNORMAL HIGH (ref 0.76–1.27)
Glucose: 94 mg/dL (ref 70–99)
Potassium: 3.8 mmol/L (ref 3.5–5.2)
Sodium: 139 mmol/L (ref 134–144)
eGFR: 57 mL/min/{1.73_m2} — ABNORMAL LOW (ref >59)

## 2022-08-25 LAB — MICROALBUMIN / CREATININE URINE RATIO
Creatinine, Urine: 19.7 mg/dL
Microalb/Creat Ratio: <15 mg/g creat (ref 0–29)
Microalbumin, Urine: <3 ug/mL

## 2022-08-25 LAB — HEPATIC FUNCTION PANEL
ALT: 40 IU/L (ref 0–44)
AST: 30 IU/L (ref 0–40)
Albumin: 4.4 g/dL (ref 3.8–4.8)
Alkaline Phosphatase: 63 IU/L (ref 44–121)
Bilirubin Total: 0.6 mg/dL (ref 0.0–1.2)
Bilirubin, Direct: 0.19 mg/dL (ref 0.00–0.40)
Total Protein: 7.4 g/dL (ref 6.0–8.5)

## 2022-08-25 LAB — PSA: Prostate Specific Ag, Serum: 2.6 ng/mL (ref 0.0–4.0)

## 2022-08-29 DIAGNOSIS — H5213 Myopia, bilateral: Secondary | ICD-10-CM | POA: Diagnosis not present

## 2022-08-29 DIAGNOSIS — H43813 Vitreous degeneration, bilateral: Secondary | ICD-10-CM | POA: Diagnosis not present

## 2022-08-29 DIAGNOSIS — H52203 Unspecified astigmatism, bilateral: Secondary | ICD-10-CM | POA: Diagnosis not present

## 2022-08-29 DIAGNOSIS — Z961 Presence of intraocular lens: Secondary | ICD-10-CM | POA: Diagnosis not present

## 2022-08-29 DIAGNOSIS — E119 Type 2 diabetes mellitus without complications: Secondary | ICD-10-CM | POA: Diagnosis not present

## 2022-08-29 DIAGNOSIS — H524 Presbyopia: Secondary | ICD-10-CM | POA: Diagnosis not present

## 2022-09-07 DIAGNOSIS — R972 Elevated prostate specific antigen [PSA]: Secondary | ICD-10-CM | POA: Diagnosis not present

## 2022-09-07 DIAGNOSIS — N400 Enlarged prostate without lower urinary tract symptoms: Secondary | ICD-10-CM | POA: Diagnosis not present

## 2022-09-15 LAB — HM DIABETES EYE EXAM

## 2023-01-17 DIAGNOSIS — C61 Malignant neoplasm of prostate: Secondary | ICD-10-CM | POA: Diagnosis not present

## 2023-01-17 DIAGNOSIS — R300 Dysuria: Secondary | ICD-10-CM | POA: Diagnosis not present

## 2023-01-17 DIAGNOSIS — N400 Enlarged prostate without lower urinary tract symptoms: Secondary | ICD-10-CM | POA: Diagnosis not present

## 2023-01-18 DIAGNOSIS — G4733 Obstructive sleep apnea (adult) (pediatric): Secondary | ICD-10-CM | POA: Diagnosis not present

## 2023-02-05 ENCOUNTER — Other Ambulatory Visit: Payer: Self-pay | Admitting: Family Medicine

## 2023-02-05 DIAGNOSIS — E785 Hyperlipidemia, unspecified: Secondary | ICD-10-CM

## 2023-02-12 DIAGNOSIS — N4289 Other specified disorders of prostate: Secondary | ICD-10-CM | POA: Diagnosis not present

## 2023-02-12 DIAGNOSIS — C61 Malignant neoplasm of prostate: Secondary | ICD-10-CM | POA: Diagnosis not present

## 2023-02-14 DIAGNOSIS — C61 Malignant neoplasm of prostate: Secondary | ICD-10-CM | POA: Diagnosis not present

## 2023-06-15 ENCOUNTER — Other Ambulatory Visit: Payer: Self-pay | Admitting: Family Medicine

## 2023-06-15 DIAGNOSIS — E118 Type 2 diabetes mellitus with unspecified complications: Secondary | ICD-10-CM

## 2023-07-02 ENCOUNTER — Other Ambulatory Visit: Payer: Self-pay | Admitting: Family Medicine

## 2023-07-02 DIAGNOSIS — I10 Essential (primary) hypertension: Secondary | ICD-10-CM

## 2023-07-29 ENCOUNTER — Other Ambulatory Visit: Payer: Self-pay | Admitting: Family Medicine

## 2023-07-29 DIAGNOSIS — E785 Hyperlipidemia, unspecified: Secondary | ICD-10-CM

## 2023-08-09 DIAGNOSIS — T1512XA Foreign body in conjunctival sac, left eye, initial encounter: Secondary | ICD-10-CM | POA: Diagnosis not present

## 2023-08-09 DIAGNOSIS — H04123 Dry eye syndrome of bilateral lacrimal glands: Secondary | ICD-10-CM | POA: Diagnosis not present

## 2023-08-17 DIAGNOSIS — C61 Malignant neoplasm of prostate: Secondary | ICD-10-CM | POA: Diagnosis not present

## 2023-08-18 DIAGNOSIS — C61 Malignant neoplasm of prostate: Secondary | ICD-10-CM | POA: Diagnosis not present

## 2023-08-22 DIAGNOSIS — C61 Malignant neoplasm of prostate: Secondary | ICD-10-CM | POA: Diagnosis not present

## 2023-08-23 ENCOUNTER — Ambulatory Visit (INDEPENDENT_AMBULATORY_CARE_PROVIDER_SITE_OTHER): Payer: Medicare Other

## 2023-08-23 VITALS — Ht 66.0 in | Wt 215.0 lb

## 2023-08-23 DIAGNOSIS — Z0000 Encounter for general adult medical examination without abnormal findings: Secondary | ICD-10-CM

## 2023-08-23 NOTE — Patient Instructions (Signed)
 Jason Rhodes , Thank you for taking time out of your busy schedule to complete your Annual Wellness Visit with me. I enjoyed our conversation and look forward to speaking with you again next year. I, as well as your care team,  appreciate your ongoing commitment to your health goals. Please review the following plan we discussed and let me know if I can assist you in the future. Your Game plan/ To Do List    Referrals: If you haven't heard from the office you've been referred to, please reach out to them at the phone provided.   Follow up Visits: Next Medicare AWV with our clinical staff: 08/28/2024 at 9:50 a.m. Phone Visit with Nurse health Advisor   Have you seen your provider in the last 6 months (3 months if uncontrolled diabetes)? No Next Office Visit with your provider: 08/28/2023 at 9:50 a.m. Complete Physical Exam and Fasting Labwork with Dr. Irby Mannan  Clinician Recommendations:  Aim for 30 minutes of exercise or brisk walking, 6-8 glasses of water, and 5 servings of fruits and vegetables each day.       This is a list of the screening recommended for you and due dates:  Health Maintenance  Topic Date Due   Complete foot exam   06/25/2022   Hemoglobin A1C  11/26/2022   COVID-19 Vaccine (5 - 2024-25 season) 11/26/2022   DTaP/Tdap/Td vaccine (2 - Td or Tdap) 01/09/2023   Yearly kidney function blood test for diabetes  08/24/2023   Yearly kidney health urinalysis for diabetes  08/24/2023   Eye exam for diabetics  09/15/2023   Flu Shot  10/26/2023   Medicare Annual Wellness Visit  08/22/2024   Colon Cancer Screening  01/19/2025   Pneumonia Vaccine  Completed   Hepatitis C Screening  Completed   Zoster (Shingles) Vaccine  Completed   HPV Vaccine  Aged Out   Meningitis B Vaccine  Aged Out    Advanced directives: (Declined) Advance directive discussed with you today. Even though you declined this today, please call our office should you change your mind, and we can give you the proper  paperwork for you to fill out. Advance Care Planning is important because it:  [x]  Makes sure you receive the medical care that is consistent with your values, goals, and preferences  [x]  It provides guidance to your family and loved ones and reduces their decisional burden about whether or not they are making the right decisions based on your wishes.  Follow the link provided in your after visit summary or read over the paperwork we have mailed to you to help you started getting your Advance Directives in place. If you need assistance in completing these, please reach out to us  so that we can help you!  See attachments for Preventive Care and Fall Prevention Tips.

## 2023-08-23 NOTE — Progress Notes (Addendum)
 Because this visit was a virtual/telehealth visit,  certain criteria was not obtained, such a blood pressure, CBG if applicable, and timed get up and go. Any medications not marked as "taking" were not mentioned during the medication reconciliation part of the visit. Any vitals not documented were not able to be obtained due to this being a telehealth visit or patient was unable to self-report a recent blood pressure reading due to a lack of equipment at home via telehealth. Vitals that have been documented are verbally provided by the patient.   Subjective:   Jason Rhodes is a 73 y.o. who presents for a Medicare Wellness preventive visit.  As a reminder, Annual Wellness Visits don't include a physical exam, and some assessments may be limited, especially if this visit is performed virtually. We may recommend an in-person follow-up visit with your provider if needed.  Visit Complete: Virtual I connected with  Jason Rhodes on 08/23/23 by a audio enabled telemedicine application and verified that I am speaking with the correct person using two identifiers.  Patient Location: Home  Provider Location: Office/Clinic  I discussed the limitations of evaluation and management by telemedicine. The patient expressed understanding and agreed to proceed.  Vital Signs: Because this visit was a virtual/telehealth visit, some criteria may be missing or patient reported. Any vitals not documented were not able to be obtained and vitals that have been documented are patient reported.  VideoDeclined- This patient declined Librarian, academic. Therefore the visit was completed with audio only.  Persons Participating in Visit: Patient.  AWV Questionnaire: No: Patient Medicare AWV questionnaire was not completed prior to this visit.  Cardiac Risk Factors include: advanced age (>80men, >80 women);diabetes mellitus;dyslipidemia;family history of premature cardiovascular  disease;hypertension;male gender;obesity (BMI >30kg/m2)     Objective:     Today's Vitals   08/23/23 0952 08/23/23 0953  Weight: 215 lb (97.5 kg)   Height: 5\' 6"  (1.676 m)   PainSc: 0-No pain 0-No pain   Body mass index is 34.7 kg/m.     08/23/2023    9:55 AM 08/24/2022   10:53 AM 08/18/2022    9:57 AM 05/26/2022    2:19 PM 01/10/2022    8:34 AM 05/31/2021    3:16 PM 12/07/2020    2:09 PM  Advanced Directives  Does Patient Have a Medical Advance Directive? No No No No No No No  Would patient like information on creating a medical advance directive? No - Patient declined Yes (MAU/Ambulatory/Procedural Areas - Information given) Yes (MAU/Ambulatory/Procedural Areas - Information given) No - Patient declined No - Patient declined Yes (MAU/Ambulatory/Procedural Areas - Information given) No - Patient declined    Current Medications (verified) Outpatient Encounter Medications as of 08/23/2023  Medication Sig   amLODipine  (NORVASC ) 10 MG tablet Take 1 tablet by mouth once daily   atorvastatin  (LIPITOR) 40 MG tablet Take 1 tablet by mouth once daily   Blood Glucose Monitoring Suppl (ONE TOUCH ULTRA 2) W/DEVICE KIT Check sugars at least 3 times daily or as directed by provider   colchicine  0.6 MG tablet Take 1 tablet (0.6 mg total) by mouth daily. Take a double dose on the first day. Take the first tablet, and then one hour later, take a second. Then start taking one tablet per day until the flare resolves.   glucose blood test strip Use One test strip to check glucose level 3 times daily.  ICD-10 code: E11.9.  Use One Touch Ultra Blue Test Strips.  lisinopril -hydrochlorothiazide  (ZESTORETIC ) 10-12.5 MG tablet Take 1 tablet by mouth once daily   metFORMIN  (GLUCOPHAGE ) 500 MG tablet Take 1 tablet (500 mg total) by mouth daily with breakfast. Needs appt before next refill.   ONETOUCH DELICA LANCETS 33G MISC Use to test blood glucose 3 times daily. ICD-10 code: E11.9.   No facility-administered  encounter medications on file as of 08/23/2023.    Allergies (verified) Patient has no known allergies.   History: Past Medical History:  Diagnosis Date   Bifascicular block    BPH (benign prostatic hyperplasia) 08/27/2012   Cancer (HCC) 06/11/2019   Prostate   COVID-19 virus infection 09/27/2019   Diabetes mellitus without complication (HCC)    Dizziness    Essential hypertension 08/27/2012   Exposure to COVID-19 virus 09/23/2019   Hyperlipidemia    Left shoulder pain 04/03/2019   Neuropathy, cervical (radicular) 01/20/2015   New onset type 2 diabetes mellitus (HCC) 02/23/2015   OSA (obstructive sleep apnea) 02/13/2014   Sleep study 05/2014 - severe OSA; on CPAP    Painless rectal bleeding 08/29/2018   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 Active surveillance with oncology at [facility], Dr. Alto Atta. Most recent PSAs ~3.   Right knee pain 01/30/2017   Sickle cell trait (HCC) 01/10/2013   Per patient report    Past Surgical History:  Procedure Laterality Date   CATARACT EXTRACTION, BILATERAL  2023   Dr. Roslynn Coombes   HERNIA REPAIR     PROSTATE SURGERY     SHOULDER SURGERY     Family History  Problem Relation Age of Onset   Diabetes Mother    Hypertension Mother    Diabetes Father    Hypertension Father    Hypertension Brother    Diabetes Brother    Diabetes Brother    Social History   Socioeconomic History   Marital status: Married    Spouse name: Jason Rhodes    Number of children: 3   Years of education: 18   Highest education level: Master's degree (e.g., MA, MS, MEng, MEd, MSW, MBA)  Occupational History   Occupation: Retired  Tobacco Use   Smoking status: Former    Current packs/day: 0.00    Average packs/day: 1 pack/day for 5.0 years (5.0 ttl pk-yrs)    Types: Cigarettes    Start date: 22     Quit date: 1980    Years since quitting: 45.4    Passive exposure: Past   Smokeless tobacco: Never  Vaping Use   Vaping status: Never Used  Substance and Sexual Activity   Alcohol use: No   Drug use: No   Sexual activity: Yes  Other Topics Concern   Not on file  Social History Narrative   Patient lives with his wife Jason [facility] patient.)    Patient enjoys working out with his wife at [employer].   Exercises ~daily for 1 hour.   Owned delivery business- now fully retired.   Patient enjoys spending time with his family, his 3 children.    Social Drivers of Corporate investment banker Strain: Low Risk  (08/23/2023)   Overall Financial Resource Strain (CARDIA)    Difficulty of Paying Living Expenses: Not hard at all  Food Insecurity: No Food Insecurity (08/23/2023)   Hunger Vital  Sign    Worried About Programme researcher, broadcasting/film/video in the Last Year: Never true    Ran Out of Food in the Last Year: Never true  Transportation Needs: No Transportation Needs (08/23/2023)   PRAPARE - Administrator, Civil Service (Medical): No    Lack of Transportation (Non-Medical): No  Physical Activity: Sufficiently Active (08/23/2023)   Exercise Vital Sign    Days of Exercise per Week: 7 days    Minutes of Exercise per Session: 60 min  Stress: No Stress Concern Present (08/23/2023)   [employer] of Occupational Health - Occupational Stress Questionnaire    Feeling of Stress : Not at all  Social Connections: Moderately Integrated (08/23/2023)   Social Connection and Isolation Panel [NHANES]    Frequency of Communication with Friends and Family: More than three times a week    Frequency of Social Gatherings with Friends and Family: Three times a week    Attends Religious Services: More than 4 times per year    Active Member of Clubs or Organizations: No    Attends Banker Meetings: Never    Marital Status: Married    Tobacco Counseling Counseling given: Not  Answered    Clinical Intake:  Pre-visit preparation completed: Yes  Pain : No/denies pain Pain Score: 0-No pain     BMI - recorded: 34.7 Nutritional Status: BMI > 30  Obese Nutritional Risks: None Diabetes: Yes CBG done?: No Did pt. bring in CBG monitor from home?: No  Lab Results  Component Value Date   HGBA1C 6.2 05/26/2022   HGBA1C 6.2 01/10/2022   HGBA1C 6.3 06/24/2021     How often do you need to have someone help you when you read instructions, pamphlets, or other written materials from your doctor or pharmacy?: 1 - Never  Interpreter Needed?: No  Information entered by :: Art Levan N. Leyan Branden, LPN.   Activities of Daily Living     08/23/2023   10:19 AM 08/23/2023    9:58 AM  In your present state of health, do you have any difficulty performing the following activities:  Hearing? 0 0  Vision? 0 0  Difficulty concentrating or making decisions? 1 0  Comment BSE: READING, PUZZLES, GAMES ON PHONE BSE: READING, GAMES ON PHONE & PUZZLES  Walking or climbing stairs? 0 0  Dressing or bathing? 0 0  Doing errands, shopping? 0 0  Preparing Food and eating ? N N  Using the Toilet? N N  In the past six months, have you accidently leaked urine? N N  Do you have problems with loss of bowel control? N N  Managing your Medications? N N  Managing your Finances? N N  Housekeeping or managing your Housekeeping? N N    Patient Care Team: Ivin Marrow, MD as PCP - General (Family Medicine) Arty Binning, MD (Inactive) as Consulting Physician (Cardiology) Serafin Dames, MD as Consulting Physician (Gastroenterology) Alto Atta Dana Duncan, NP as Nurse Practitioner (Nurse Practitioner) Rudine Cos, MD as Consulting Physician (Ophthalmology) Valiant Gaul, Deeann Fare, MD as Referring Physician (Pediatrics)  Indicate any recent Medical Services you may have received from other than [facility] providers in the past year (date may be approximate).     Assessment:    This is  a routine wellness examination for Jason Rhodes.  Hearing/Vision screen Hearing Screening - Comments:: Denies hearing difficulties.   Vision Screening - Comments:: Wears rx glasses, cataracts removed - up to date with routine eye exams with Rudine Cos, MD.  Goals Addressed               This Visit's Progress     08/23/23: To remain physically active and independent.        COMPLETED: Increase water intake (pt-stated)         Depression Screen     08/23/2023    9:58 AM 08/24/2022   10:53 AM 08/18/2022   10:08 AM 05/26/2022    2:20 PM 05/26/2022    2:17 PM 01/10/2022    8:34 AM 06/24/2021   11:23 AM  PHQ 2/9 Scores  PHQ - 2 Score 0 0 0 0 0 0 0  PHQ- 9 Score 0 0  0 0 0 0    Fall Risk     08/23/2023    9:57 AM 08/24/2022   10:53 AM 08/18/2022   10:07 AM 05/26/2022    2:19 PM 01/10/2022    8:34 AM  Fall Risk   Falls in the past year? 0 0 0 0 0  Number falls in past yr: 0 0 0 0 0  Injury with Fall? 0 0 0 0 0  Risk for fall due to : No Fall Risks  No Fall Risks  No Fall Risks  Follow up Falls evaluation completed  Falls prevention discussed;Education provided;Falls evaluation completed      MEDICARE RISK AT HOME:  Medicare Risk at Home Any stairs in or around the home?: Yes (front & back entrance) If so, are there any without handrails?: No Home free of loose throw rugs in walkways, pet beds, electrical cords, etc?: Yes Adequate lighting in your home to reduce risk of falls?: Yes Life alert?: No Use of a cane, walker or w/c?: No Grab bars in the bathroom?: No Shower chair or bench in shower?: No Elevated toilet seat or a handicapped toilet?: No  TIMED UP AND GO:  Was the test performed?  No  Cognitive Function: Declined/Normal: No cognitive concerns noted by patient or family. Patient alert, oriented, able to answer questions appropriately and recall recent events. No signs of memory loss or confusion.    08/23/2023   10:00 AM  MMSE - Mini Mental State Exam  Not  completed: Unable to complete        08/23/2023   10:00 AM 08/18/2022   10:12 AM 05/31/2021    3:18 PM 06/17/2019   10:15 AM  6CIT Screen  What Year? 0 points 0 points 0 points 0 points  What month? 0 points 0 points 0 points 0 points  What time? 0 points 0 points 0 points 0 points  Count back from 20 0 points 0 points 0 points 0 points  Months in reverse 0 points 0 points 0 points 2 points  Repeat phrase 0 points 0 points 0 points 0 points  Total Score 0 points 0 points 0 points 2 points    Immunizations Immunization History  Administered Date(s) Administered   Fluad Quad(high Dose 65+) 12/07/2020, 01/10/2022   Influenza,inj,Quad PF,6+ Mos 01/08/2013, 02/13/2014, 12/07/2014, 12/29/2016   Influenza-Unspecified 12/16/2015   PFIZER Comirnaty(Gray Top)Covid-19 Tri-Sucrose Vaccine 08/30/2020   PFIZER(Purple Top)SARS-COV-2 Vaccination 01/30/2020, 02/27/2020   Pfizer Covid-19 Vaccine Bivalent Booster 66yrs & up 02/01/2021   Pneumococcal Conjugate-13 06/09/2015   Pneumococcal Polysaccharide-23 12/07/2020   Tdap 01/08/2013   Zoster Recombinant(Shingrix) 05/25/2020, 07/25/2020   Zoster, Live 06/18/2015    Screening Tests Health Maintenance  Topic Date Due   FOOT EXAM  06/25/2022   HEMOGLOBIN A1C  11/26/2022   COVID-19  Vaccine (5 - 2024-25 season) 11/26/2022   DTaP/Tdap/Td (2 - Td or Tdap) 01/09/2023   Diabetic kidney evaluation - eGFR measurement  08/24/2023   Diabetic kidney evaluation - Urine ACR  08/24/2023   OPHTHALMOLOGY EXAM  09/15/2023   INFLUENZA VACCINE  10/26/2023   Medicare Annual Wellness (AWV)  08/22/2024   Colonoscopy  01/19/2025   Pneumonia Vaccine 107+ Years old  Completed   Hepatitis C Screening  Completed   Zoster Vaccines- Shingrix  Completed   HPV VACCINES  Aged Out   Meningococcal B Vaccine  Aged Out    Health Maintenance  Health Maintenance Due  Topic Date Due   FOOT EXAM  06/25/2022   HEMOGLOBIN A1C  11/26/2022   COVID-19 Vaccine (5 - 2024-25  season) 11/26/2022   DTaP/Tdap/Td (2 - Td or Tdap) 01/09/2023   Diabetic kidney evaluation - eGFR measurement  08/24/2023   Diabetic kidney evaluation - Urine ACR  08/24/2023   Health Maintenance Items Addressed: Yes Patient aware of current care gaps.  Immunization record was verified by [employer]. Additional Screening:  Vision Screening: Recommended annual ophthalmology exams for early detection of glaucoma and other disorders of the eye.  Dental Screening: Recommended annual dental exams for proper oral hygiene  Community Resource Referral / Chronic Care Management: CRR required this visit?  No   CCM required this visit?  No   Plan:    I have personally reviewed and noted the following in the patient's chart:   Medical and social history Use of alcohol, tobacco or illicit drugs  Current medications and supplements including opioid prescriptions. Patient is not currently taking opioid prescriptions. Functional ability and status Nutritional status Physical activity Advanced directives List of other physicians Hospitalizations, surgeries, and ER visits in previous 12 months Vitals Screenings to include cognitive, depression, and falls Referrals and appointments  In addition, I have reviewed and discussed with patient certain preventive protocols, quality metrics, and best practice recommendations. A written personalized care plan for preventive services as well as general preventive health recommendations were provided to patient.   Margette Sheldon, LPN   1/61/0960   After Visit Summary: (MyChart) Due to this being a telephonic visit, the after visit summary with patients personalized plan was offered to patient via MyChart   Notes: Patient aware of current care gaps.  Immunization record was verified by [employer].  Patient is due for diabetic lab work, diabetic foot exam and Dtap vaccine. Patient is scheduled for complete physical exam on 08/28/2023.

## 2023-08-28 ENCOUNTER — Ambulatory Visit (INDEPENDENT_AMBULATORY_CARE_PROVIDER_SITE_OTHER): Admitting: Family Medicine

## 2023-08-28 ENCOUNTER — Encounter: Payer: Self-pay | Admitting: Family Medicine

## 2023-08-28 ENCOUNTER — Ambulatory Visit (HOSPITAL_COMMUNITY)
Admission: RE | Admit: 2023-08-28 | Discharge: 2023-08-28 | Disposition: A | Discharge status: Home / Self Care | Source: Ambulatory Visit | Attending: Family Medicine | Admitting: Family Medicine

## 2023-08-28 ENCOUNTER — Other Ambulatory Visit: Payer: Self-pay

## 2023-08-28 VITALS — BP 126/62 | HR 63 | Ht 66.0 in | Wt 186.0 lb

## 2023-08-28 DIAGNOSIS — M1A9XX0 Chronic gout, unspecified, without tophus (tophi): Secondary | ICD-10-CM | POA: Diagnosis not present

## 2023-08-28 DIAGNOSIS — Z862 Personal history of diseases of the blood and blood-forming organs and certain disorders involving the immune mechanism: Secondary | ICD-10-CM

## 2023-08-28 DIAGNOSIS — E118 Type 2 diabetes mellitus with unspecified complications: Secondary | ICD-10-CM | POA: Diagnosis not present

## 2023-08-28 DIAGNOSIS — I10 Essential (primary) hypertension: Secondary | ICD-10-CM | POA: Diagnosis not present

## 2023-08-28 DIAGNOSIS — Z23 Encounter for immunization: Secondary | ICD-10-CM | POA: Diagnosis not present

## 2023-08-28 DIAGNOSIS — R072 Precordial pain: Secondary | ICD-10-CM | POA: Diagnosis not present

## 2023-08-28 DIAGNOSIS — E785 Hyperlipidemia, unspecified: Secondary | ICD-10-CM

## 2023-08-28 LAB — POCT GLYCOSYLATED HEMOGLOBIN (HGB A1C): HbA1c, POC (controlled diabetic range): 5.3 % (ref 0.0–7.0)

## 2023-08-28 MED ORDER — METFORMIN HCL 500 MG PO TABS: 500.0000 mg | ORAL_TABLET | Freq: Every day | ORAL | 1 refills | Status: DC

## 2023-08-28 MED ORDER — LISINOPRIL-HYDROCHLOROTHIAZIDE 10-12.5 MG PO TABS
1.0000 | ORAL_TABLET | Freq: Every day | ORAL | 1 refills | Status: DC
Start: 2023-08-28 — End: 2024-02-19

## 2023-08-28 MED ORDER — TETANUS-DIPHTH-ACELL PERTUSSIS 5-2.5-18.5 LF-MCG/0.5 IM SUSP: 0.5000 mL | Freq: Once | INTRAMUSCULAR | 0 refills | Status: AC

## 2023-08-28 MED ORDER — COLCHICINE 0.6 MG PO TABS: 0.6000 mg | ORAL_TABLET | Freq: Every day | ORAL | 0 refills | Status: AC

## 2023-08-28 MED ORDER — ATORVASTATIN CALCIUM 40 MG PO TABS: 40.0000 mg | ORAL_TABLET | Freq: Every day | ORAL | 0 refills | Status: DC

## 2023-08-28 NOTE — Patient Instructions (Addendum)
 It was great to see you! Thank you for allowing me to participate in your care!  Our plans for today:  - I will the know the results of your labs today. - Please call your cardiology office to schedule repeat appointment. - I have refilled your blood pressure medicines, your diabetes medicine your cholesterol medicine, gout medicine. - I have provided you with a tetanus vaccine which she can take to any [facility] or [facility] to have done. - You can call this number to reschedule an appointment with your Cardiologist - [phone]   Please arrive 15 minutes PRIOR to your next scheduled appointment time! If you do not, this affects OTHER patients' care.  Take care and seek immediate care sooner if you develop any concerns.   Ivin Marrow, MD, PGY-2 [facility] 9:42 AM 08/28/2023  [facility]

## 2023-08-28 NOTE — Assessment & Plan Note (Signed)
Continue atorvastatin 40 mg daily. Lipid panel today. 

## 2023-08-28 NOTE — Assessment & Plan Note (Signed)
 No current flare.  Refilled colchicine  for as needed use today.

## 2023-08-28 NOTE — Assessment & Plan Note (Signed)
 Blood pressure controlled.  Will continue amlodipine  10 mg daily and started 10-12.5 mg daily.  Refilled.

## 2023-08-28 NOTE — Assessment & Plan Note (Signed)
 A1c 5.3, controlled, continue metformin  500 mg daily.  Refilled. - Lipid panel - BMP - Urine microalbumin

## 2023-08-28 NOTE — Progress Notes (Signed)
    SUBJECTIVE:   CHIEF COMPLAINT / HPI: physical  HTN -Amlodipine , taking - needs refill -Taking Zestoretic  - needs refill - Measures once and a while sys 130  T2DM - Only taking Metformin  500 - needs refills  GOUT - Takes Colchicine  as need for flares - Has a flare up once per month, usually in 1st MTPs - Needs refill of colchicine   Hx of low blood levels - Reports he has had low white blood cells in the past - Thinks he was lost follow-up  HLD - Needs to refill of Atorvastatin  - Taking Atorvastatin .  Chest pain - Intermittent once every 2 weeks - On the left side of chest - Describes it as grabbing sharp pain - Nonradiating - Last for around 5 seconds - Has been ongoing for years  PERTINENT  PMH / PSH: HTN, OSA, T2DM  OBJECTIVE:   BP 126/62   Pulse 63   Ht 5\' 6"  (1.676 m)   Wt 186 lb (84.4 kg)   SpO2 99%   BMI 30.02 kg/m   General: A&O, NAD, lying comfortably in hospital bed HEENT: No sign of trauma, EOM grossly intact, moist mucous membranes Cardiac: RRR, no m/r/g Respiratory: CTAB, normal WOB, no w/c/r GI: Soft, NTTP, non-distended, no rebound or guarding Extremities: NTTP, no peripheral edema. Neuro: Moves all four extremities appropriately Psych: Appropriate mood and affect   ASSESSMENT/PLAN:   Assessment & Plan Controlled type 2 diabetes mellitus with complication, without long-term current use of insulin (HCC) A1c 5.3, controlled, continue metformin  500 mg daily.  Refilled. 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  Cardiac exam is reassuringly.  Will get EKG given length of time since previous and symptoms are abnormal.  EKG personally reviewed, without new ST changes since prior 3 years ago.  Provided with office number patient to call to reschedule with cardiology. Essential hypertension Blood pressure  controlled.  Will continue amlodipine  10 mg daily and started 10-12.5 mg daily.  Refilled. Hyperlipidemia, unspecified hyperlipidemia type Continue atorvastatin  40 mg daily. - Lipid panel today Hx of iron deficiency anemia Per chart review hemoglobin previously decreased, I am unclear on this was evaluated based on chart review.  Up-to-date on colonoscopy, next due 2026. -Will repeat CBC with differential today Chronic gout involving toe without tophus, unspecified cause, unspecified laterality No current flare.  Refilled colchicine  for as needed use today. Need for Tdap vaccination Provided with printed out prescription to take to his pharmacy.  Return in about 2 months (around 10/28/2023).  Ivin Marrow, MD [facility]

## 2023-08-29 ENCOUNTER — Ambulatory Visit: Payer: Self-pay | Admitting: Family Medicine

## 2023-08-29 LAB — CBC WITH DIFFERENTIAL/PLATELET
Basophils Absolute: 0 10*3/uL (ref 0.0–0.2)
Basos: 1 %
EOS (ABSOLUTE): 0 10*3/uL (ref 0.0–0.4)
Eos: 1 %
Hematocrit: 40.5 % (ref 37.5–51.0)
Hemoglobin: 12.9 g/dL — ABNORMAL LOW (ref 13.0–17.7)
Immature Grans (Abs): 0 10*3/uL (ref 0.0–0.1)
Immature Granulocytes: 0 %
Lymphocytes Absolute: 1.8 10*3/uL (ref 0.7–3.1)
Lymphs: 42 %
MCH: 30.7 pg (ref 26.6–33.0)
MCHC: 31.9 g/dL (ref 31.5–35.7)
MCV: 96 fL (ref 79–97)
Monocytes Absolute: 0.4 10*3/uL (ref 0.1–0.9)
Monocytes: 9 %
Neutrophils Absolute: 2 10*3/uL (ref 1.4–7.0)
Neutrophils: 47 %
Platelets: 235 10*3/uL (ref 150–450)
RBC: 4.2 x10E6/uL (ref 4.14–5.80)
RDW: 11.4 % — ABNORMAL LOW (ref 11.6–15.4)
WBC: 4.2 10*3/uL (ref 3.4–10.8)

## 2023-08-29 LAB — LIPID PANEL
Chol/HDL Ratio: 2.7 ratio (ref 0.0–5.0)
Cholesterol, Total: 125 mg/dL (ref 100–199)
HDL: 47 mg/dL (ref >39)
LDL Chol Calc (NIH): 64 mg/dL (ref 0–99)
Triglycerides: 65 mg/dL (ref 0–149)
VLDL Cholesterol Cal: 14 mg/dL (ref 5–40)

## 2023-08-29 LAB — BASIC METABOLIC PANEL WITH GFR
BUN/Creatinine Ratio: 6 — ABNORMAL LOW (ref 10–24)
BUN: 8 mg/dL (ref 8–27)
CO2: 24 mmol/L (ref 20–29)
Calcium: 9.5 mg/dL (ref 8.6–10.2)
Chloride: 101 mmol/L (ref 96–106)
Creatinine, Ser: 1.24 mg/dL (ref 0.76–1.27)
Glucose: 101 mg/dL — ABNORMAL HIGH (ref 70–99)
Potassium: 4.2 mmol/L (ref 3.5–5.2)
Sodium: 140 mmol/L (ref 134–144)
eGFR: 61 mL/min/{1.73_m2} (ref >59)

## 2023-08-29 LAB — MICROALBUMIN / CREATININE URINE RATIO
Creatinine, Urine: 296.4 mg/dL
Microalb/Creat Ratio: 4 mg/g{creat} (ref 0–29)
Microalbumin, Urine: 11.5 ug/mL

## 2023-08-30 ENCOUNTER — Other Ambulatory Visit: Payer: Self-pay | Admitting: Family Medicine

## 2023-08-30 DIAGNOSIS — E119 Type 2 diabetes mellitus without complications: Secondary | ICD-10-CM | POA: Diagnosis not present

## 2023-08-30 DIAGNOSIS — H5213 Myopia, bilateral: Secondary | ICD-10-CM | POA: Diagnosis not present

## 2023-08-30 DIAGNOSIS — H524 Presbyopia: Secondary | ICD-10-CM | POA: Diagnosis not present

## 2023-08-30 DIAGNOSIS — Z862 Personal history of diseases of the blood and blood-forming organs and certain disorders involving the immune mechanism: Secondary | ICD-10-CM

## 2023-08-30 DIAGNOSIS — H26492 Other secondary cataract, left eye: Secondary | ICD-10-CM | POA: Diagnosis not present

## 2023-08-30 LAB — HM DIABETES EYE EXAM

## 2023-08-30 NOTE — Telephone Encounter (Signed)
 Called patient regarding recent lab work.  Discussed that his hemoglobin is decreased to 12.9.  This has been historically decreased however as per chart review this has not been worked up to my knowledge.  I discussed that we should evaluate his anemia to which patient is agreeable.  Scheduled for lab visit tomorrow for anemia panel.  Will schedule follow-up visit to discuss the results of those labs.  Otherwise normal cholesterol, normal urine microalbumin, normal metabolic panel.

## 2023-08-31 ENCOUNTER — Other Ambulatory Visit

## 2023-08-31 ENCOUNTER — Ambulatory Visit: Payer: Self-pay | Admitting: Family Medicine

## 2023-08-31 DIAGNOSIS — Z862 Personal history of diseases of the blood and blood-forming organs and certain disorders involving the immune mechanism: Secondary | ICD-10-CM

## 2023-09-02 ENCOUNTER — Encounter: Payer: Self-pay | Admitting: Family Medicine

## 2023-09-04 ENCOUNTER — Ambulatory Visit: Payer: Self-pay | Admitting: Family Medicine

## 2023-09-04 LAB — ANEMIA PANEL
Ferritin: 103 ng/mL (ref 30–400)
Folate, Hemolysate: 319 ng/mL
Folate, RBC: 829 ng/mL (ref >498)
Hematocrit: 38.5 % (ref 37.5–51.0)
Iron Saturation: 37 % (ref 15–55)
Iron: 104 ug/dL (ref 38–169)
Retic Ct Pct: 1.3 % (ref 0.6–2.6)
Total Iron Binding Capacity: 281 ug/dL (ref 250–450)
UIBC: 177 ug/dL (ref 111–343)
Vitamin B-12: 430 pg/mL (ref 232–1245)

## 2023-09-08 ENCOUNTER — Other Ambulatory Visit: Payer: Self-pay | Admitting: Family Medicine

## 2023-09-08 DIAGNOSIS — I10 Essential (primary) hypertension: Secondary | ICD-10-CM

## 2023-10-09 ENCOUNTER — Ambulatory Visit (INDEPENDENT_AMBULATORY_CARE_PROVIDER_SITE_OTHER): Admitting: Family Medicine

## 2023-10-09 ENCOUNTER — Ambulatory Visit (HOSPITAL_BASED_OUTPATIENT_CLINIC_OR_DEPARTMENT_OTHER)
Admission: RE | Admit: 2023-10-09 | Discharge: 2023-10-09 | Disposition: A | Discharge status: Home / Self Care | Source: Ambulatory Visit | Attending: Family Medicine | Admitting: Family Medicine

## 2023-10-09 VITALS — BP 113/73 | HR 85 | Ht 66.0 in | Wt 187.8 lb

## 2023-10-09 DIAGNOSIS — R519 Headache, unspecified: Secondary | ICD-10-CM

## 2023-10-09 DIAGNOSIS — R51 Headache with orthostatic component, not elsewhere classified: Secondary | ICD-10-CM | POA: Diagnosis not present

## 2023-10-09 NOTE — Patient Instructions (Signed)
 It was great to see you today! Thank you for choosing [facility] for your primary care.  Today we addressed: 1. Positional headache Please go to your CT appointment today as noted in the attached paperwork. You will need to check in at the [facility] ED for imaging because the radiology staff will not be available to check you in.  I will call you with the results.  If it is normal, we will get a follow up MRI.  IF we find anything concerning, we will take it one step at at time.  Please go to the ED if you have sudden worsening of your headache when you are not bending down or if you experience nausea/vomiting, confusion, or any other new symptoms you are concerned about.  Thank you for coming to see us  at [facility] and for the opportunity to care for you! Steffen Hase, MD 10/09/2023, 3:41 PM

## 2023-10-09 NOTE — Progress Notes (Cosign Needed)
 SUBJECTIVE:   CHIEF COMPLAINT / HPI:  Jason Rhodes is a 73 y.o. male with a pertinent past medical history of HTN, OSA, T2DM, HLD, and anemia presenting to the clinic for headache.  Headache 2 weeks ago, patient started having headaches when he bends down. No headache when standing up, sitting up straight, lying down. Occurs specifically when he bends over below belt height, occurs every time he does this, headache is very reproducible with this movement. Patient notes that he started colchicine  2 weeks ago for a gout flare up, which roughly coincided with the start of this headache.  He stopped the colchicine  1 week ago. No recent nausea, no vomiting. No vision abnormalities, blurring. No weakness in extremities, arms, legs.  Steady gait, no recent falls. No confusion, no trouble thinking. This has never happened before. Patient had a diabetes eye exam 2 weeks ago which was unremarkable.  Anemia Chronic for several years. Recent iron panel normal. Denies dyspnea, bleeding. Last colonoscopy 2021, next due after 5 years in 2026, polyp removed.   PERTINENT PMH / PSH: HTN, OSA, T2DM, HLD, anemia  *Remainder reviewed in problem list.   OBJECTIVE:   BP 113/73   Pulse 85   Ht 5' 6 (1.676 m)   Wt 187 lb 12.8 oz (85.2 kg)   SpO2 98%   BMI 30.31 kg/m   General: Age-appropriate, resting comfortably in chair, NAD, alert and at baseline. HEENT: Head: Normocephalic, atraumatic. No tenderness to percussion over sinuses. Eyes: PERRLA. No conjunctival erythema or scleral injections. Nose: Non-erythematous turbinates. No rhinorrhea or CSF drainage. Mouth/Oral: Clear, MMM. Neck: Supple. No LAD. Fundoscopic: Limited by lack of eye dilation but no papilledema, optic disc edema, or hemorrhages noted today. Cardiovascular: Regular rate and rhythm. Normal S1/S2. No murmurs, rubs, or gallops appreciated. 2+ radial pulses. Pulmonary: Normal WOB on room air. Abdominal: No tenderness to  deep or light palpation. No rebound or guarding. No HSM. Extremities: No peripheral edema bilaterally. Capillary refill <2 seconds.  Neurological examination: MS:  Awake, alert, interactive. Normal eye contact, answered the questions appropriately, speech was fluent, normal comprehension.  Attention and concentration were normal.  Alert to self, place, month, and situation.  Reports headache when bending forward in sitting position below waist, immediately resolves upon straightening, highly reproducible. Cranial nerves:    CN II:  PERRLA.  Blinks normally to threat bilaterally. CNs III, IV, VI:  Full extraocular eye movement without nystagmus.  No ptosis or diplopia. CN V:  Facial sensation is normal, no weakness of masticatory muscles.  CN VII:  No facial weakness or asymmetry.  CN VIII:  Auditory acuity grossly normal. CNs XI/X:  Palate elevation symmetric. CN XI:  Normal trapezius strength. CN XII: Tongue is midline without atrophy or fasciculations. MOTOR:  Strength 5/5 RUE, 5/5 LUE, 5/5 RLE, 5/5 LLE. COORDINATION:  Intact finger-to-nose, no tremor.  No difficulty with balance. SENSATION:  Intact to light touch over all four extremities.  Romberg negative. GAIT:  Normal walk, no antalgia, steady.    ASSESSMENT/PLAN:   Assessment & Plan Positional headache Nonintractable episodic headache, unspecified headache type Concern for elevated ICH given spontaneous onset of positional and clearly reproducible headache in older adult.  Possible etiologies include hemorrhage, which would be emergent finding.  Reassuringly, patient has not had any recent head trauma and is not on any blood thinners.  Differential also includes structural anatomical abnormalities, cervical spinal pathology, or POTS, however must first rule out emergent pathology and will then continue with further  workup.  Reassuringly no concurrent nausea and vomiting mild pain quickly resolves when sitting upright.  Review of  systems unremarkable. - STAT CT head WO scheduled for today at 5:15 PM - IF unremarkable, will plan for MRI brain W WO - Send to ED for urgent evaluation if concern for bleed - Strict ED precautions if worsening headache (thunderclap discussed), change in mental status, or nausea/vomiting begins  Jason Valbuena Toma, MD [facility]

## 2023-10-11 ENCOUNTER — Ambulatory Visit: Payer: Self-pay | Admitting: Family Medicine

## 2023-10-11 ENCOUNTER — Telehealth: Payer: Self-pay | Admitting: Family Medicine

## 2023-10-11 DIAGNOSIS — J0130 Acute sphenoidal sinusitis, unspecified: Secondary | ICD-10-CM

## 2023-10-11 MED ORDER — AMOXICILLIN-POT CLAVULANATE 875-125 MG PO TABS: 1.0000 | ORAL_TABLET | Freq: Two times a day (BID) | ORAL | 0 refills | Status: AC

## 2023-10-11 NOTE — Telephone Encounter (Signed)
 Noted sinusitis concern on CT imaging, which would be consistent with patient's positional headaches.  No evidence of elevated ICH or hemorrhage.  Called patient, verified DOB.  Discussed findings. - Will treat sinusitis w/ Augmentin  875-125 mg BID x7 days - Reviewed return/ED precautions if worsening headache, nausea/vomiting, AMS, other changes in symptoms - Follow up scheduled in clinic for 7/29 with me

## 2023-10-23 ENCOUNTER — Encounter: Payer: Self-pay | Admitting: Family Medicine

## 2023-10-23 ENCOUNTER — Ambulatory Visit (INDEPENDENT_AMBULATORY_CARE_PROVIDER_SITE_OTHER): Payer: Self-pay | Admitting: Family Medicine

## 2023-10-23 VITALS — BP 125/72 | HR 71 | Ht 66.0 in | Wt 189.4 lb

## 2023-10-23 DIAGNOSIS — J0130 Acute sphenoidal sinusitis, unspecified: Secondary | ICD-10-CM

## 2023-10-23 DIAGNOSIS — E118 Type 2 diabetes mellitus with unspecified complications: Secondary | ICD-10-CM | POA: Diagnosis not present

## 2023-10-23 NOTE — Progress Notes (Signed)
   SUBJECTIVE:   CHIEF COMPLAINT / HPI:  Jason Rhodes is a 73 y.o. male with a pertinent past medical history of HTN, OSA, T2DM, HLD, and anemia presenting to the clinic for 2 week follow up on   Seen on 10/09/2023 for 2 weeks of positional headache noted when bending forward. Neurological exam was unremarkable at the time, but given persistent nature of new onset headache in patient >65 and positional quality, ordered STAT CT head. CT head showed no acute intracranial abnormality with mucosal thickening in the sphenoid sinuses correlatable for sinusitis. Treated with Augmentin  875-125 mg BID x7 days. Patient now reporting resolution of symptoms after treatment course.  T2DM foot Reports no recent calluses or A1c 5.3 on 6/3.  On metformin  500 mg daily.   PERTINENT PMH / PSH: HTN, OSA, T2DM, HLD, anemia  *Remainder reviewed in problem list.   OBJECTIVE:   BP 125/72   Pulse 71   Ht 5' 6 (1.676 m)   Wt 189 lb 6.4 oz (85.9 kg)   SpO2 100%   BMI 30.57 kg/m   General: Age-appropriate, resting comfortably in chair, NAD, alert and at baseline. HEENT:  Head: Normocephalic, atraumatic. No tenderness to percussion over sinuses. Eyes: PERRLA. No conjunctival erythema or scleral injections. Nose: No rhinorrhea. Mouth/Oral: Clear, MMM. Pulmonary: Clear bilaterally to ascultation. No wheezes, crackles, or rhonchi. Normal WOB on room air. No accessory muscle use. Neuro: No focal neurological deficit. Extremities: No peripheral edema bilaterally. Capillary refill <2 seconds.  Diabetic Foot Exam - Simple   Simple Foot Form Visual Inspection No deformities, no ulcerations, no other skin breakdown bilaterally: Yes Sensation Testing Intact to touch and monofilament testing bilaterally: Yes Pulse Check Posterior Tibialis and Dorsalis pulse intact bilaterally: Yes Comments Diabetic foot exam with monofilament shows intact sensation and discrimination over all toes and plantar surfaces of  feet bilaterally.  No ulcers or calluses present.     ASSESSMENT/PLAN:   Assessment & Plan Acute non-recurrent sphenoidal sinusitis Sinusitis resolved with 7 day Augmentin  course.  No neurological concerns, no further headache.  Reassuring results.  No further management indicated. - Return precautions if symptoms recurr Controlled type 2 diabetes mellitus with complication, without long-term current use of insulin (HCC) Diabetic foot exam normal, no deficits or calluses/lesions.  Education provided. A1c well controlled on metformin  500 mg daily    Vinh Sachs, MD [facility]

## 2023-10-23 NOTE — Patient Instructions (Signed)
 It was great to see you today! Thank you for choosing [facility] for your primary care.  Today we addressed: Sinusitis I am glad that you are doing better, there is nothing more to do.  Please come back if symptoms return!  You should return to our clinic for regular PCP follow up.  Thank you for coming to see us  at [facility] and for the opportunity to care for you! Kyion Gautier, MD 10/23/2023, 1:42 PM

## 2023-10-23 NOTE — Assessment & Plan Note (Signed)
 Diabetic foot exam normal, no deficits or calluses/lesions.  Education provided. A1c well controlled on metformin  500 mg daily

## 2023-11-05 ENCOUNTER — Ambulatory Visit (INDEPENDENT_AMBULATORY_CARE_PROVIDER_SITE_OTHER): Admitting: Family Medicine

## 2023-11-05 ENCOUNTER — Encounter: Payer: Self-pay | Admitting: Family Medicine

## 2023-11-05 VITALS — BP 107/65 | HR 65 | Ht 66.0 in | Wt 187.6 lb

## 2023-11-05 DIAGNOSIS — D649 Anemia, unspecified: Secondary | ICD-10-CM | POA: Diagnosis not present

## 2023-11-05 NOTE — Patient Instructions (Addendum)
 It was great to see you! Thank you for allowing me to participate in your care!  Our plans for today:  - I will let you know the results of your labs. - Otherwise you are doing pretty well on your health. - I will see you again to check your A1c in 1 month.   Please arrive 15 minutes PRIOR to your next scheduled appointment time! If you do not, this affects OTHER patients' care.  Take care and seek immediate care sooner if you develop any concerns.   Ozell Provencal, MD, PGY-3 [facility] 4:20 PM 11/05/2023  [facility]

## 2023-11-05 NOTE — Progress Notes (Signed)
    SUBJECTIVE:   CHIEF COMPLAINT / HPI: f/u  Follow-up on iron deficiency Okay with labs today. Not taking iron. Not cravings. No blood in stool. Up to date on colonoscopy. No fatigue or shortness of breath.  PERTINENT  PMH / PSH: OSA, HTN, T2DM, Prostate cancer  OBJECTIVE:   BP 107/65   Pulse 65   Ht 5' 6 (1.676 m)   Wt 187 lb 9.6 oz (85.1 kg)   SpO2 100%   BMI 30.28 kg/m   General: NAD, well appearing Neuro: A&O Respiratory: normal WOB on RA Extremities: Moving all 4 extremities equally   ASSESSMENT/PLAN:   Assessment & Plan Normocytic anemia Unclear cause, iron panel normal 2 months ago.  Last hemoglobin 12.9.  Per chart review appears chronic for over 10 years.  No acute symptoms.  Previous colonoscopy normal, next due 2026.  Further evaluate with B12, folate, TSH, CMP.  Follow-up 1 month.  Return in about 1 month (around 12/06/2023).  Ozell Provencal, MD [facility]

## 2023-11-05 NOTE — Assessment & Plan Note (Signed)
 Unclear cause, iron panel normal 2 months ago.  Last hemoglobin 12.9.  Per chart review appears chronic for over 10 years.  No acute symptoms.  Previous colonoscopy normal, next due 2026.  Further evaluate with B12, folate, TSH, CMP.  Follow-up 1 month.

## 2023-11-06 ENCOUNTER — Other Ambulatory Visit

## 2023-11-06 DIAGNOSIS — D649 Anemia, unspecified: Secondary | ICD-10-CM | POA: Diagnosis not present

## 2023-11-07 ENCOUNTER — Ambulatory Visit: Admitting: Family Medicine

## 2023-11-07 LAB — CBC WITH DIFFERENTIAL/PLATELET
Basophils Absolute: 0 x10E3/uL (ref 0.0–0.2)
Basos: 1 %
EOS (ABSOLUTE): 0.1 x10E3/uL (ref 0.0–0.4)
Eos: 1 %
Hematocrit: 39.4 % (ref 37.5–51.0)
Hemoglobin: 12.6 g/dL — ABNORMAL LOW (ref 13.0–17.7)
Immature Grans (Abs): 0 x10E3/uL (ref 0.0–0.1)
Immature Granulocytes: 0 %
Lymphocytes Absolute: 2.2 x10E3/uL (ref 0.7–3.1)
Lymphs: 47 %
MCH: 30.1 pg (ref 26.6–33.0)
MCHC: 32 g/dL (ref 31.5–35.7)
MCV: 94 fL (ref 79–97)
Monocytes Absolute: 0.4 x10E3/uL (ref 0.1–0.9)
Monocytes: 7 %
Neutrophils Absolute: 2.1 x10E3/uL (ref 1.4–7.0)
Neutrophils: 44 %
Platelets: 261 x10E3/uL (ref 150–450)
RBC: 4.19 x10E6/uL (ref 4.14–5.80)
RDW: 12 % (ref 11.6–15.4)
WBC: 4.7 x10E3/uL (ref 3.4–10.8)

## 2023-11-07 LAB — COMPREHENSIVE METABOLIC PANEL WITH GFR
ALT: 28 IU/L (ref 0–44)
AST: 24 IU/L (ref 0–40)
Albumin: 4.6 g/dL (ref 3.8–4.8)
Alkaline Phosphatase: 59 IU/L (ref 44–121)
BUN/Creatinine Ratio: 10 (ref 10–24)
BUN: 13 mg/dL (ref 8–27)
Bilirubin Total: 0.9 mg/dL (ref 0.0–1.2)
CO2: 22 mmol/L (ref 20–29)
Calcium: 9.6 mg/dL (ref 8.6–10.2)
Chloride: 102 mmol/L (ref 96–106)
Creatinine, Ser: 1.25 mg/dL (ref 0.76–1.27)
Globulin, Total: 2.6 g/dL (ref 1.5–4.5)
Glucose: 113 mg/dL — ABNORMAL HIGH (ref 70–99)
Potassium: 3.9 mmol/L (ref 3.5–5.2)
Sodium: 139 mmol/L (ref 134–144)
Total Protein: 7.2 g/dL (ref 6.0–8.5)
eGFR: 61 mL/min/1.73 (ref >59)

## 2023-11-07 LAB — VITAMIN B12: Vitamin B-12: 373 pg/mL (ref 232–1245)

## 2023-11-07 LAB — FOLATE: Folate: 12.3 ng/mL (ref >3.0)

## 2023-11-07 LAB — TSH RFX ON ABNORMAL TO FREE T4: TSH: 1.78 u[IU]/mL (ref 0.450–4.500)

## 2023-11-12 ENCOUNTER — Ambulatory Visit: Payer: Self-pay | Admitting: Family Medicine

## 2023-12-03 DIAGNOSIS — R310 Gross hematuria: Secondary | ICD-10-CM | POA: Diagnosis not present

## 2023-12-08 ENCOUNTER — Other Ambulatory Visit: Payer: Self-pay | Admitting: Family Medicine

## 2023-12-08 DIAGNOSIS — I10 Essential (primary) hypertension: Secondary | ICD-10-CM

## 2023-12-11 ENCOUNTER — Encounter: Payer: Self-pay | Admitting: Family Medicine

## 2023-12-11 ENCOUNTER — Ambulatory Visit (INDEPENDENT_AMBULATORY_CARE_PROVIDER_SITE_OTHER): Admitting: Family Medicine

## 2023-12-11 VITALS — BP 116/78 | HR 76 | Wt 189.4 lb

## 2023-12-11 DIAGNOSIS — C61 Malignant neoplasm of prostate: Secondary | ICD-10-CM | POA: Diagnosis not present

## 2023-12-11 DIAGNOSIS — D649 Anemia, unspecified: Secondary | ICD-10-CM

## 2023-12-11 NOTE — Patient Instructions (Signed)
 It was great to see you! Thank you for allowing me to participate in your care!  Our plans for today:  - I have placed a referral to hematology. They should call you to schedule an appointment. - I will see you again in 6 months or earlier you need to.    Please arrive 15 minutes PRIOR to your next scheduled appointment time! If you do not, this affects OTHER patients' care.  Take care and seek immediate care sooner if you develop any concerns.   Ozell Provencal, MD, PGY-3 [facility] 3:51 PM 12/11/2023  [facility]

## 2023-12-11 NOTE — Progress Notes (Signed)
    SUBJECTIVE:   CHIEF COMPLAINT / HPI: anemia f/u  Gross hematuria/Hx of prostate cancer/BPH  CT Urogram ordered by urology. Has this scheduled for next week  Chronic anemia Ongoing for years Continues to be mildly decrease No SOB, dizziness, or lightheadness  Hx of tobacco abuse Quit smoking in 30s ~1/2 pack per day Smoke for around 5 years Does not need lung cancer screening  PERTINENT  PMH / PSH: OSA, HTN, T2DM, Prostate cancer   OBJECTIVE:   BP 116/78   Pulse 76   Wt 189 lb 6.4 oz (85.9 kg)   SpO2 100%   BMI 30.57 kg/m   General: NAD, well appearing Neuro: A&O Respiratory: normal WOB on RA Extremities: Moving all 4 extremities equally   ASSESSMENT/PLAN:   Assessment & Plan Normocytic anemia Chronic mild anemia ongoing 10 years.  Gross workup negative for obvious cause of anemia.  May be secondary to intermittent gross hematuria in the setting of his prostate cancer; however, his anemia predates his diagnosis of prostate cancer.  Shared decision making with patient, patient would like to be evaluated by hematology.  Referral placed. Prostate cancer [facility]) Discussed continued surveillance and management per [facility] urology.  Has follow-up and repeat imaging.  Return in about 6 months (around 06/09/2024).  Jason Provencal, MD [facility]

## 2023-12-11 NOTE — Assessment & Plan Note (Signed)
 Chronic mild anemia ongoing 10 years.  Gross workup negative for obvious cause of anemia.  May be secondary to intermittent gross hematuria in the setting of his prostate cancer; however, his anemia predates his diagnosis of prostate cancer.  Shared decision making with patient, patient would like to be evaluated by hematology.  Referral placed.

## 2023-12-11 NOTE — Assessment & Plan Note (Signed)
 Discussed continued surveillance and management per [facility] urology.  Has follow-up and repeat imaging.

## 2023-12-28 DIAGNOSIS — K8689 Other specified diseases of pancreas: Secondary | ICD-10-CM | POA: Diagnosis not present

## 2023-12-28 DIAGNOSIS — C61 Malignant neoplasm of prostate: Secondary | ICD-10-CM | POA: Diagnosis not present

## 2023-12-28 DIAGNOSIS — R310 Gross hematuria: Secondary | ICD-10-CM | POA: Diagnosis not present

## 2024-01-07 ENCOUNTER — Inpatient Hospital Stay: Attending: Hematology & Oncology

## 2024-01-07 ENCOUNTER — Inpatient Hospital Stay: Admitting: Hematology & Oncology

## 2024-01-07 ENCOUNTER — Encounter: Payer: Self-pay | Admitting: Hematology & Oncology

## 2024-01-07 ENCOUNTER — Other Ambulatory Visit: Payer: Self-pay

## 2024-01-07 VITALS — BP 117/73 | HR 62 | Temp 98.7°F | Resp 16 | Ht 66.0 in | Wt 190.0 lb

## 2024-01-07 DIAGNOSIS — N400 Enlarged prostate without lower urinary tract symptoms: Secondary | ICD-10-CM | POA: Insufficient documentation

## 2024-01-07 DIAGNOSIS — Z79899 Other long term (current) drug therapy: Secondary | ICD-10-CM | POA: Insufficient documentation

## 2024-01-07 DIAGNOSIS — E119 Type 2 diabetes mellitus without complications: Secondary | ICD-10-CM | POA: Insufficient documentation

## 2024-01-07 DIAGNOSIS — Z7984 Long term (current) use of oral hypoglycemic drugs: Secondary | ICD-10-CM | POA: Insufficient documentation

## 2024-01-07 DIAGNOSIS — G4730 Sleep apnea, unspecified: Secondary | ICD-10-CM | POA: Diagnosis not present

## 2024-01-07 DIAGNOSIS — D352 Benign neoplasm of pituitary gland: Secondary | ICD-10-CM

## 2024-01-07 DIAGNOSIS — E785 Hyperlipidemia, unspecified: Secondary | ICD-10-CM | POA: Insufficient documentation

## 2024-01-07 DIAGNOSIS — I10 Essential (primary) hypertension: Secondary | ICD-10-CM | POA: Insufficient documentation

## 2024-01-07 DIAGNOSIS — D571 Sickle-cell disease without crisis: Secondary | ICD-10-CM | POA: Diagnosis not present

## 2024-01-07 DIAGNOSIS — Z8616 Personal history of COVID-19: Secondary | ICD-10-CM | POA: Insufficient documentation

## 2024-01-07 DIAGNOSIS — R310 Gross hematuria: Secondary | ICD-10-CM | POA: Diagnosis not present

## 2024-01-07 DIAGNOSIS — D649 Anemia, unspecified: Secondary | ICD-10-CM | POA: Diagnosis not present

## 2024-01-07 DIAGNOSIS — Z87891 Personal history of nicotine dependence: Secondary | ICD-10-CM | POA: Insufficient documentation

## 2024-01-07 DIAGNOSIS — C61 Malignant neoplasm of prostate: Secondary | ICD-10-CM | POA: Diagnosis not present

## 2024-01-07 DIAGNOSIS — G629 Polyneuropathy, unspecified: Secondary | ICD-10-CM | POA: Insufficient documentation

## 2024-01-07 LAB — CBC WITH DIFFERENTIAL (CANCER CENTER ONLY)
Abs Immature Granulocytes: 0.01 K/uL (ref 0.00–0.07)
Basophils Absolute: 0 K/uL (ref 0.0–0.1)
Basophils Relative: 1 %
Eosinophils Absolute: 0 K/uL (ref 0.0–0.5)
Eosinophils Relative: 0 %
HCT: 38.1 % — ABNORMAL LOW (ref 39.0–52.0)
Hemoglobin: 12.3 g/dL — ABNORMAL LOW (ref 13.0–17.0)
Immature Granulocytes: 0 %
Lymphocytes Relative: 40 %
Lymphs Abs: 2.1 K/uL (ref 0.7–4.0)
MCH: 29.8 pg (ref 26.0–34.0)
MCHC: 32.3 g/dL (ref 30.0–36.0)
MCV: 92.3 fL (ref 80.0–100.0)
Monocytes Absolute: 0.4 K/uL (ref 0.1–1.0)
Monocytes Relative: 8 %
Neutro Abs: 2.6 K/uL (ref 1.7–7.7)
Neutrophils Relative %: 51 %
Platelet Count: 230 K/uL (ref 150–400)
RBC: 4.13 MIL/uL — ABNORMAL LOW (ref 4.22–5.81)
RDW: 12 % (ref 11.5–15.5)
WBC Count: 5.1 K/uL (ref 4.0–10.5)
nRBC: 0 % (ref 0.0–0.2)

## 2024-01-07 LAB — CMP (CANCER CENTER ONLY)
ALT: 28 U/L (ref 0–44)
AST: 30 U/L (ref 15–41)
Albumin: 4.5 g/dL (ref 3.5–5.0)
Alkaline Phosphatase: 53 U/L (ref 38–126)
Anion gap: 12 (ref 5–15)
BUN: 11 mg/dL (ref 8–23)
CO2: 26 mmol/L (ref 22–32)
Calcium: 9.3 mg/dL (ref 8.9–10.3)
Chloride: 102 mmol/L (ref 98–111)
Creatinine: 1.29 mg/dL — ABNORMAL HIGH (ref 0.61–1.24)
GFR, Estimated: 58 mL/min — ABNORMAL LOW (ref >60)
Glucose, Bld: 98 mg/dL (ref 70–99)
Potassium: 4.1 mmol/L (ref 3.5–5.1)
Sodium: 140 mmol/L (ref 135–145)
Total Bilirubin: 0.9 mg/dL (ref 0.0–1.2)
Total Protein: 7.4 g/dL (ref 6.5–8.1)

## 2024-01-07 LAB — IRON AND IRON BINDING CAPACITY (CC-WL,HP ONLY)
Iron: 127 ug/dL (ref 45–182)
Saturation Ratios: 38 % (ref 17.9–39.5)
TIBC: 339 ug/dL (ref 250–450)
UIBC: 212 ug/dL

## 2024-01-07 LAB — RETICULOCYTES
Immature Retic Fract: 9.7 % (ref 2.3–15.9)
RBC.: 4.14 MIL/uL — ABNORMAL LOW (ref 4.22–5.81)
Retic Count, Absolute: 77 K/uL (ref 19.0–186.0)
Retic Ct Pct: 1.9 % (ref 0.4–3.1)

## 2024-01-07 LAB — FERRITIN: Ferritin: 83 ng/mL (ref 24–336)

## 2024-01-07 LAB — SAVE SMEAR(SSMR), FOR PROVIDER SLIDE REVIEW

## 2024-01-07 NOTE — Progress Notes (Signed)
 Referral MD  Reason for Referral: Mild anemia; sickle cell trait; localized prostate cancer  Chief Complaint  Patient presents with   New Patient (Initial Visit)  : I had anemia for my whole life.  HPI: Jason Rhodes is a very nice 73 year old [place] male.  He actually has a husband that one of our patients.  He has sickle cell trait.  He is not on no folic acid  for this.  He also has history of localized prostate cancer.  I think goes down to [facility] for this.  He is worried about his anemia.  He has been mildly anemic.  He would like to see his hemoglobin above 13.  Going back to 2009, his white cell count is 4.8.  Hemoglobin 12.7.  Platelet count 218,000.  MCV was 93.  In 2014, his hemoglobin was 12.5 with a hematocrit of 37.5.  Platelets were normal at 258.  In 2018, his hemoglobin was 12.5 with hematocrit 37.5.  MCV is 92.  His platelet count was 278,000.  White cell count was 5.3.  In 2020, his hemoglobin was 12.9 with a hematocrit of 39.6.  MCV was 91.  He has not had no history of bleeding.  He does have his colonoscopies.  He has had no problems with COVID.  His electrolytes have all looked okay.  He has no issues with renal failure.  His iron studies that were done back in June showed a ferritin of 103 with an iron saturation of 37%.  He had a folic acid  of 12.3.  He has had a vitamin B12 level of 337 pg/dL.  Again, he has sickle cell trait.  He has been asymptomatic.  He does not smoke.  He does not drink alcohol.  As far as he knows, there is no history of anemia in the family outside of sickle cell.  Overall, I would say that his performance status is probably ECOG 0.  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 Active surveillance with oncology at [facility], Dr. Abigail. Most recent PSAs ~3.   Right knee pain 01/30/2017   Sickle cell trait 01/10/2013   Per patient report   :   Past Surgical History:  Procedure Laterality Date   CATARACT EXTRACTION, BILATERAL  2023   Dr. Patrcia   HERNIA REPAIR     PROSTATE SURGERY     SHOULDER SURGERY    :   Current Outpatient Medications:    amLODipine  (NORVASC ) 10 MG tablet, Take 1 tablet (10 mg total) by mouth daily., Disp: 90 tablet, Rfl: 0   atorvastatin  (LIPITOR) 40 MG tablet, Take 1 tablet (40 mg total) by  mouth daily., Disp: 90 tablet, Rfl: 0   Blood Glucose Monitoring Suppl (ONE TOUCH ULTRA 2) W/DEVICE KIT, Check sugars at least 3 times daily or as directed by provider, Disp: 1 each, Rfl: 0   colchicine  0.6 MG tablet, Take 1 tablet (0.6 mg total) by mouth daily. Take a double dose on the first day. Take the first tablet, and then one hour later, take a second. Then start taking one tablet per day until the flare resolves. (Patient taking differently: Take 0.6 mg by mouth daily as needed. Take a double dose on the first day. Take the first tablet, and then one hour later, take a second. Then start taking one tablet per day until the flare resolves.), Disp: 30 tablet, Rfl: 0   glucose blood test strip, Use One test strip to check glucose level 3 times daily.  ICD-10 code: E11.9.  Use One Touch Ultra Blue Test Strips., Disp: 100  each, Rfl: 5   lisinopril -hydrochlorothiazide  (ZESTORETIC ) 10-12.5 MG tablet, Take 1 tablet by mouth daily., Disp: 90 tablet, Rfl: 1   metFORMIN  (GLUCOPHAGE ) 500 MG tablet, Take 1 tablet (500 mg total) by mouth daily with breakfast. Needs appt before next refill., Disp: 90 tablet, Rfl: 1   ONETOUCH DELICA LANCETS 33G MISC, Use to test blood glucose 3 times daily. ICD-10 code: E11.9., Disp: 100 each, Rfl: 5:  :  No Known Allergies:   Family History  Problem Relation Age of Onset   Diabetes Mother    Hypertension Mother    Diabetes Father    Hypertension Father    Hypertension Brother    Diabetes Brother    Diabetes Brother   :   Social History   Socioeconomic History   Marital status: Married    Spouse name: Heron    Number of children: 3   Years of education: 18   Highest education level: Manufacturing engineer (e.g., MA, MS, MEng, MEd, MSW, MBA)  Occupational History   Occupation: Retired  Tobacco Use   Smoking status: Former    Current packs/day: 0.00    Average packs/day: 1 pack/day for 5.0 years (5.0 ttl pk-yrs)    Types: Cigarettes    Start date: 54    Quit date: 1980    Years since quitting: 45.8    Passive exposure: Past   Smokeless tobacco: Never  Vaping Use   Vaping status: Never Used  Substance and Sexual Activity   Alcohol use: No   Drug use: No   Sexual activity: Yes  Other Topics Concern   Not on file  Social History Narrative   Patient lives with his wife Heron [facility] patient.)    Patient enjoys working out with his wife at [employer].   Exercises ~daily for 1 hour.   Owned delivery business- now fully retired.   Patient enjoys spending time with his family, his 3 children.    Social Drivers of Corporate investment banker Strain: Low Risk  (10/09/2023)   Overall Financial Resource Strain (CARDIA)    Difficulty of Paying Living Expenses: Not hard at all  Food Insecurity: No Food Insecurity (01/07/2024)   Hunger Vital Sign    Worried About Running  Out of Food in the Last Year: Never true    Ran Out of Food in the Last Year: Never true  Transportation Needs: No Transportation Needs (01/07/2024)   PRAPARE - Administrator, Civil Service (Medical): No    Lack of Transportation (Non-Medical): No  Physical Activity:  Insufficiently Active (10/09/2023)   Exercise Vital Sign    Days of Exercise per Week: 1 day    Minutes of Exercise per Session: 10 min  Stress: No Stress Concern Present (10/09/2023)   [employer] of Occupational Health - Occupational Stress Questionnaire    Feeling of Stress: Not at all  Social Connections: Unknown (10/09/2023)   Social Connection and Isolation Panel    Frequency of Communication with Friends and Family: Patient declined    Frequency of Social Gatherings with Friends and Family: Patient declined    Attends Religious Services: Patient declined    Database administrator or Organizations: No    Attends Engineer, structural: Not on file    Marital Status: Married  Catering manager Violence: Not At Risk (01/07/2024)   Humiliation, Afraid, Rape, and Kick questionnaire    Fear of Current or Ex-Partner: No    Emotionally Abused: No    Physically Abused: No    Sexually Abused: No  :  Review of Systems  Constitutional: Negative.   HENT: Negative.    Eyes: Negative.   Respiratory: Negative.    Cardiovascular: Negative.   Gastrointestinal: Negative.   Genitourinary: Negative.   Musculoskeletal: Negative.   Skin: Negative.   Neurological: Negative.   Endo/Heme/Allergies: Negative.   Psychiatric/Behavioral: Negative.      Exam:  His vital signs show temperature of 98.7.  Pulse 62.  Blood pressure 117/73.  Weight is 190 pounds.  @IPVITALS @ Physical Exam Vitals reviewed.  HENT:     Head: Normocephalic and atraumatic.  Eyes:     Pupils: Pupils are equal, round, and reactive to light.  Cardiovascular:     Rate and Rhythm: Normal rate and regular rhythm.     Heart sounds:  Normal heart sounds.  Pulmonary:     Effort: Pulmonary effort is normal.     Breath sounds: Normal breath sounds.  Abdominal:     General: Bowel sounds are normal.     Palpations: Abdomen is soft.  Musculoskeletal:        General: No tenderness or deformity. Normal range of motion.     Cervical back: Normal range of motion.  Lymphadenopathy:     Cervical: No cervical adenopathy.  Skin:    General: Skin is warm and dry.     Findings: No erythema or rash.  Neurological:     Mental Status: He is alert and oriented to person, place, and time.  Psychiatric:        Behavior: Behavior normal.        Thought Content: Thought content normal.        Judgment: Judgment normal.     Recent Labs    01/07/24 1451  WBC 5.1  HGB 12.3*  HCT 38.1*  PLT 230    Recent Labs    01/07/24 1451  NA 140  K 4.1  CL 102  CO2 26  GLUCOSE 98  BUN 11  CREATININE 1.29*  CALCIUM  9.3    Blood smear review: Pending  Pathology: None    Assessment and Plan: Jason Rhodes is a very nice 73 year old African male.  He is from [place].  He has sickle cell trait from what he says.  Again, I really do not think that he really is anemic..  I think for him, this is probably going to be his typical hemoglobin.  I think that with the sickle cell trait, he is always going to have an element of anemia.  I do  not think he needs to have a bone marrow biopsy done.  I do not know if anything has to do with the prostate cancer.  He is not on any treatment for the prostate cancer.  If he was on androgen deprivation, this would clearly lead to anemia.  I do think he needs to have some folic acid .  I think this may help a little bit.  I will put him on 2 mg a day of folic acid .  Again, I really think that his hemoglobin will trend a little bit.  I think he will always have a range that his hemoglobin will stay in.  Again, I do not see that he is going to need any kind of bone marrow biopsy.  We will plan to  get back to see us  probably in about 2 months.  Hopefully all goes well with the prostate.  He may need to have more therapy for the prostate cancer.

## 2024-01-08 ENCOUNTER — Other Ambulatory Visit: Payer: Self-pay

## 2024-01-08 ENCOUNTER — Ambulatory Visit: Payer: Self-pay | Admitting: Hematology & Oncology

## 2024-01-08 DIAGNOSIS — D649 Anemia, unspecified: Secondary | ICD-10-CM

## 2024-01-08 DIAGNOSIS — C61 Malignant neoplasm of prostate: Secondary | ICD-10-CM

## 2024-01-08 DIAGNOSIS — D573 Sickle-cell trait: Secondary | ICD-10-CM

## 2024-01-08 LAB — PSA, TOTAL AND FREE
PSA, Free Pct: 65.3 %
PSA, Free: 1.11 ng/mL
Prostate Specific Ag, Serum: 1.7 ng/mL (ref 0.0–4.0)

## 2024-01-08 LAB — ERYTHROPOIETIN: Erythropoietin: 14 m[IU]/mL (ref 2.6–18.5)

## 2024-01-08 LAB — TESTOSTERONE: Testosterone: 467 ng/dL (ref 264–916)

## 2024-01-08 MED ORDER — FOLIC ACID 1 MG PO TABS: 2.0000 mg | ORAL_TABLET | Freq: Every day | ORAL | 5 refills | Status: AC

## 2024-01-09 LAB — HGB FRACTIONATION CASCADE
Hgb A2: 2.5 % (ref 1.8–3.2)
Hgb A: 97.5 % (ref 96.4–98.8)
Hgb F: 0 % (ref 0.0–2.0)
Hgb S: 0 %

## 2024-01-14 DIAGNOSIS — Z6830 Body mass index (BMI) 30.0-30.9, adult: Secondary | ICD-10-CM | POA: Diagnosis not present

## 2024-01-14 DIAGNOSIS — N139 Obstructive and reflux uropathy, unspecified: Secondary | ICD-10-CM | POA: Diagnosis not present

## 2024-01-14 DIAGNOSIS — N138 Other obstructive and reflux uropathy: Secondary | ICD-10-CM | POA: Diagnosis not present

## 2024-01-14 DIAGNOSIS — N400 Enlarged prostate without lower urinary tract symptoms: Secondary | ICD-10-CM | POA: Diagnosis not present

## 2024-01-14 DIAGNOSIS — Z7984 Long term (current) use of oral hypoglycemic drugs: Secondary | ICD-10-CM | POA: Diagnosis not present

## 2024-01-14 DIAGNOSIS — Z79899 Other long term (current) drug therapy: Secondary | ICD-10-CM | POA: Diagnosis not present

## 2024-01-14 DIAGNOSIS — N401 Enlarged prostate with lower urinary tract symptoms: Secondary | ICD-10-CM | POA: Diagnosis not present

## 2024-01-14 DIAGNOSIS — R310 Gross hematuria: Secondary | ICD-10-CM | POA: Diagnosis not present

## 2024-01-14 DIAGNOSIS — I10 Essential (primary) hypertension: Secondary | ICD-10-CM | POA: Diagnosis not present

## 2024-01-14 DIAGNOSIS — E7800 Pure hypercholesterolemia, unspecified: Secondary | ICD-10-CM | POA: Diagnosis not present

## 2024-01-14 DIAGNOSIS — E1065 Type 1 diabetes mellitus with hyperglycemia: Secondary | ICD-10-CM | POA: Diagnosis not present

## 2024-01-14 DIAGNOSIS — E669 Obesity, unspecified: Secondary | ICD-10-CM | POA: Diagnosis not present

## 2024-01-15 DIAGNOSIS — Z6830 Body mass index (BMI) 30.0-30.9, adult: Secondary | ICD-10-CM | POA: Diagnosis not present

## 2024-01-15 DIAGNOSIS — R310 Gross hematuria: Secondary | ICD-10-CM | POA: Diagnosis not present

## 2024-01-15 DIAGNOSIS — E1165 Type 2 diabetes mellitus with hyperglycemia: Secondary | ICD-10-CM | POA: Diagnosis not present

## 2024-01-15 DIAGNOSIS — N400 Enlarged prostate without lower urinary tract symptoms: Secondary | ICD-10-CM | POA: Diagnosis not present

## 2024-01-15 DIAGNOSIS — I10 Essential (primary) hypertension: Secondary | ICD-10-CM | POA: Diagnosis not present

## 2024-01-15 DIAGNOSIS — E669 Obesity, unspecified: Secondary | ICD-10-CM | POA: Diagnosis not present

## 2024-01-15 DIAGNOSIS — E7800 Pure hypercholesterolemia, unspecified: Secondary | ICD-10-CM | POA: Diagnosis not present

## 2024-01-15 DIAGNOSIS — E1065 Type 1 diabetes mellitus with hyperglycemia: Secondary | ICD-10-CM | POA: Diagnosis not present

## 2024-01-20 ENCOUNTER — Other Ambulatory Visit: Payer: Self-pay | Admitting: Family Medicine

## 2024-01-20 DIAGNOSIS — E118 Type 2 diabetes mellitus with unspecified complications: Secondary | ICD-10-CM

## 2024-01-20 DIAGNOSIS — E785 Hyperlipidemia, unspecified: Secondary | ICD-10-CM

## 2024-02-03 ENCOUNTER — Encounter: Payer: Self-pay | Admitting: Family Medicine

## 2024-02-05 DIAGNOSIS — R932 Abnormal findings on diagnostic imaging of liver and biliary tract: Secondary | ICD-10-CM | POA: Diagnosis not present

## 2024-02-05 DIAGNOSIS — K8689 Other specified diseases of pancreas: Secondary | ICD-10-CM | POA: Diagnosis not present

## 2024-02-05 DIAGNOSIS — C61 Malignant neoplasm of prostate: Secondary | ICD-10-CM | POA: Diagnosis not present

## 2024-02-06 DIAGNOSIS — R933 Abnormal findings on diagnostic imaging of other parts of digestive tract: Secondary | ICD-10-CM | POA: Diagnosis not present

## 2024-02-13 DIAGNOSIS — R933 Abnormal findings on diagnostic imaging of other parts of digestive tract: Secondary | ICD-10-CM | POA: Diagnosis not present

## 2024-02-13 DIAGNOSIS — R935 Abnormal findings on diagnostic imaging of other abdominal regions, including retroperitoneum: Secondary | ICD-10-CM | POA: Diagnosis not present

## 2024-02-13 DIAGNOSIS — R978 Other abnormal tumor markers: Secondary | ICD-10-CM | POA: Diagnosis not present

## 2024-02-13 DIAGNOSIS — G8929 Other chronic pain: Secondary | ICD-10-CM | POA: Diagnosis not present

## 2024-02-13 DIAGNOSIS — K8689 Other specified diseases of pancreas: Secondary | ICD-10-CM | POA: Diagnosis not present

## 2024-02-13 DIAGNOSIS — Z8546 Personal history of malignant neoplasm of prostate: Secondary | ICD-10-CM | POA: Diagnosis not present

## 2024-02-19 ENCOUNTER — Other Ambulatory Visit: Payer: Self-pay | Admitting: Family Medicine

## 2024-02-19 DIAGNOSIS — I10 Essential (primary) hypertension: Secondary | ICD-10-CM

## 2024-02-26 DIAGNOSIS — Z79899 Other long term (current) drug therapy: Secondary | ICD-10-CM | POA: Diagnosis not present

## 2024-02-26 DIAGNOSIS — K760 Fatty (change of) liver, not elsewhere classified: Secondary | ICD-10-CM | POA: Diagnosis not present

## 2024-02-26 DIAGNOSIS — N400 Enlarged prostate without lower urinary tract symptoms: Secondary | ICD-10-CM | POA: Diagnosis not present

## 2024-02-26 DIAGNOSIS — Z7984 Long term (current) use of oral hypoglycemic drugs: Secondary | ICD-10-CM | POA: Diagnosis not present

## 2024-02-26 DIAGNOSIS — I10 Essential (primary) hypertension: Secondary | ICD-10-CM | POA: Diagnosis not present

## 2024-02-26 DIAGNOSIS — R978 Other abnormal tumor markers: Secondary | ICD-10-CM | POA: Diagnosis not present

## 2024-02-26 DIAGNOSIS — E109 Type 1 diabetes mellitus without complications: Secondary | ICD-10-CM | POA: Diagnosis not present

## 2024-02-26 DIAGNOSIS — K8689 Other specified diseases of pancreas: Secondary | ICD-10-CM | POA: Diagnosis not present

## 2024-02-26 DIAGNOSIS — E7800 Pure hypercholesterolemia, unspecified: Secondary | ICD-10-CM | POA: Diagnosis not present

## 2024-02-26 DIAGNOSIS — K861 Other chronic pancreatitis: Secondary | ICD-10-CM | POA: Diagnosis not present

## 2024-02-26 DIAGNOSIS — E119 Type 2 diabetes mellitus without complications: Secondary | ICD-10-CM | POA: Diagnosis not present

## 2024-03-04 ENCOUNTER — Encounter: Payer: Self-pay | Admitting: Hematology & Oncology

## 2024-03-04 ENCOUNTER — Inpatient Hospital Stay: Attending: Hematology & Oncology

## 2024-03-04 ENCOUNTER — Other Ambulatory Visit: Payer: Self-pay

## 2024-03-04 ENCOUNTER — Inpatient Hospital Stay: Admitting: Hematology & Oncology

## 2024-03-04 VITALS — BP 114/67 | HR 64 | Temp 98.5°F | Resp 18 | Ht 66.0 in | Wt 192.0 lb

## 2024-03-04 DIAGNOSIS — D573 Sickle-cell trait: Secondary | ICD-10-CM

## 2024-03-04 DIAGNOSIS — Z7984 Long term (current) use of oral hypoglycemic drugs: Secondary | ICD-10-CM | POA: Diagnosis not present

## 2024-03-04 DIAGNOSIS — Z79899 Other long term (current) drug therapy: Secondary | ICD-10-CM | POA: Diagnosis not present

## 2024-03-04 DIAGNOSIS — D649 Anemia, unspecified: Secondary | ICD-10-CM | POA: Insufficient documentation

## 2024-03-04 DIAGNOSIS — C61 Malignant neoplasm of prostate: Secondary | ICD-10-CM | POA: Diagnosis not present

## 2024-03-04 LAB — CMP (CANCER CENTER ONLY)
ALT: 32 U/L (ref 0–44)
AST: 28 U/L (ref 15–41)
Albumin: 4.4 g/dL (ref 3.5–5.0)
Alkaline Phosphatase: 54 U/L (ref 38–126)
Anion gap: 14 (ref 5–15)
BUN: 8 mg/dL (ref 8–23)
CO2: 23 mmol/L (ref 22–32)
Calcium: 9.1 mg/dL (ref 8.9–10.3)
Chloride: 102 mmol/L (ref 98–111)
Creatinine: 1.25 mg/dL — ABNORMAL HIGH (ref 0.61–1.24)
GFR, Estimated: >60 mL/min (ref >60)
Glucose, Bld: 127 mg/dL — ABNORMAL HIGH (ref 70–99)
Potassium: 3.9 mmol/L (ref 3.5–5.1)
Sodium: 139 mmol/L (ref 135–145)
Total Bilirubin: 0.9 mg/dL (ref 0.0–1.2)
Total Protein: 7.2 g/dL (ref 6.5–8.1)

## 2024-03-04 LAB — CBC WITH DIFFERENTIAL (CANCER CENTER ONLY)
Abs Immature Granulocytes: 0.01 K/uL (ref 0.00–0.07)
Basophils Absolute: 0 K/uL (ref 0.0–0.1)
Basophils Relative: 1 %
Eosinophils Absolute: 0 K/uL (ref 0.0–0.5)
Eosinophils Relative: 1 %
HCT: 36.8 % — ABNORMAL LOW (ref 39.0–52.0)
Hemoglobin: 12.1 g/dL — ABNORMAL LOW (ref 13.0–17.0)
Immature Granulocytes: 0 %
Lymphocytes Relative: 39 %
Lymphs Abs: 1.8 K/uL (ref 0.7–4.0)
MCH: 30.3 pg (ref 26.0–34.0)
MCHC: 32.9 g/dL (ref 30.0–36.0)
MCV: 92 fL (ref 80.0–100.0)
Monocytes Absolute: 0.4 K/uL (ref 0.1–1.0)
Monocytes Relative: 9 %
Neutro Abs: 2.4 K/uL (ref 1.7–7.7)
Neutrophils Relative %: 50 %
Platelet Count: 212 K/uL (ref 150–400)
RBC: 4 MIL/uL — ABNORMAL LOW (ref 4.22–5.81)
RDW: 12 % (ref 11.5–15.5)
WBC Count: 4.7 K/uL (ref 4.0–10.5)
nRBC: 0 % (ref 0.0–0.2)

## 2024-03-04 LAB — FERRITIN: Ferritin: 79 ng/mL (ref 24–336)

## 2024-03-04 LAB — IRON AND IRON BINDING CAPACITY (CC-WL,HP ONLY)
Iron: 148 ug/dL (ref 45–182)
Saturation Ratios: 44 % — ABNORMAL HIGH (ref 17.9–39.5)
TIBC: 333 ug/dL (ref 250–450)
UIBC: 185 ug/dL

## 2024-03-04 LAB — RETICULOCYTES
Immature Retic Fract: 13 % (ref 2.3–15.9)
RBC.: 3.98 MIL/uL — ABNORMAL LOW (ref 4.22–5.81)
Retic Count, Absolute: 72.4 K/uL (ref 19.0–186.0)
Retic Ct Pct: 1.8 % (ref 0.4–3.1)

## 2024-03-04 LAB — SAVE SMEAR(SSMR), FOR PROVIDER SLIDE REVIEW

## 2024-03-04 NOTE — Progress Notes (Signed)
 Hematology and Oncology Follow Up Visit  Jason Rhodes [id number] [birth year] 73 y.o. 03/04/2024   Principle Diagnosis:  Normochromic normocytic anemia-low erythropoietin  level  Current Therapy:   Observation     Interim History:  Jason Rhodes is back for follow-up.  This is his second office visit.  So far, he has been had no problems since we initially saw him.  We first saw him on 01/07/2024.  At that time, we did a workup for the anemia.  He had normal iron studies.  His ferritin was 83 with an iron saturation of 38%.  He had a normal folate level.  His B12 level was 373 pg/mL.  He did have a normal hemoglobin electrophoresis.  As such, there is no problem with sickle cell trait..  We did have a low erythropoietin  level 2014.  We have had to believe that his anemia is secondary to a low erythropoietin  level.  He did have an upper endoscopy that was done.  This was done at [facility].  This was done on 02/26/2024.  This was done because of a dilated bile duct.  Everything looked good with the pancreas.  No biopsies were taken.  He also had a TURP done.  There was some bleeding with this.  He has had a good appetite.  Had a very nice Thanksgiving.  He has had no nausea or vomiting.  He has had no rashes.  He has had no cough or shortness of breath.  He has had no headache.  Overall, I will say that his performance status is probably ECOG 0.  Medications:  Current Outpatient Medications:    amLODipine  (NORVASC ) 10 MG tablet, Take 1 tablet (10 mg total) by mouth daily., Disp: 90 tablet, Rfl: 0   atorvastatin  (LIPITOR) 40 MG tablet, Take 1 tablet by mouth once daily, Disp: 90 tablet, Rfl: 2   Blood Glucose Monitoring Suppl (ONE TOUCH ULTRA 2) W/DEVICE KIT, Check sugars at least 3 times daily or as directed by provider, Disp: 1 each, Rfl: 0   colchicine  0.6 MG tablet, Take 1 tablet (0.6 mg total) by mouth daily. Take a double dose on the first day. Take the first tablet, and then one hour later, take  a second. Then start taking one tablet per day until the flare resolves. (Patient taking differently: Take 0.6 mg by mouth daily as needed. Take a double dose on the first day. Take the first tablet, and then one hour later, take a second. Then start taking one tablet per day until the flare resolves.), Disp: 30 tablet, Rfl: 0   folic acid  (FOLVITE ) 1 MG tablet, Take 2 tablets (2 mg total) by mouth daily., Disp: 60 tablet, Rfl: 5   glucose blood test strip, Use One test strip to check glucose level 3 times daily.  ICD-10 code: E11.9.  Use One Touch Ultra Blue Test Strips., Disp: 100 each, Rfl: 5   lisinopril -hydrochlorothiazide  (ZESTORETIC ) 10-12.5 MG tablet, Take 1 tablet by mouth once daily, Disp: 90 tablet, Rfl: 0   metFORMIN  (GLUCOPHAGE ) 500 MG tablet, Take 1 tablet (500 mg total) by mouth daily with breakfast. Needs appt before next refill., Disp: 90 tablet, Rfl: 1   ONETOUCH DELICA LANCETS 33G MISC, Use to test blood glucose 3 times daily. ICD-10 code: E11.9., Disp: 100 each, Rfl: 5  Allergies: No Known Allergies  Past Medical History, Surgical history, Social history, and Family History were reviewed and updated.  Review of Systems: Review of Systems  Constitutional: Negative.   HENT:  Negative.    Eyes: Negative.   Respiratory: Negative.    Cardiovascular: Negative.   Gastrointestinal: Negative.   Endocrine: Negative.   Genitourinary: Negative.    Musculoskeletal: Negative.   Skin: Negative.   Neurological: Negative.   Hematological: Negative.   Psychiatric/Behavioral: Negative.      Physical Exam:  height is 5' 6 (1.676 m) and weight is 192 lb (87.1 kg). His oral temperature is 98.5 F (36.9 C). His blood pressure is 114/67 and his pulse is 64. His respiration is 18 and oxygen saturation is 100%.   Wt Readings from Last 3 Encounters:  03/04/24 192 lb (87.1 kg)  01/07/24 190 lb (86.2 kg)  12/11/23 189 lb 6.4 oz (85.9 kg)    Physical Exam Vitals reviewed.  HENT:      Head: Normocephalic and atraumatic.  Eyes:     Pupils: Pupils are equal, round, and reactive to light.  Cardiovascular:     Rate and Rhythm: Normal rate and regular rhythm.     Heart sounds: Normal heart sounds.  Pulmonary:     Effort: Pulmonary effort is normal.     Breath sounds: Normal breath sounds.  Abdominal:     General: Bowel sounds are normal.     Palpations: Abdomen is soft.  Musculoskeletal:        General: No tenderness or deformity. Normal range of motion.     Cervical back: Normal range of motion.  Lymphadenopathy:     Cervical: No cervical adenopathy.  Skin:    General: Skin is warm and dry.     Findings: No erythema or rash.  Neurological:     Mental Status: He is alert and oriented to person, place, and time.  Psychiatric:        Behavior: Behavior normal.        Thought Content: Thought content normal.        Judgment: Judgment normal.      Lab Results  Component Value Date   WBC 4.7 03/04/2024   HGB 12.1 (L) 03/04/2024   HCT 36.8 (L) 03/04/2024   MCV 92.0 03/04/2024   PLT 212 03/04/2024     Chemistry      Component Value Date/Time   NA 139 03/04/2024 1026   NA 139 11/06/2023 1409   K 3.9 03/04/2024 1026   CL 102 03/04/2024 1026   CO2 23 03/04/2024 1026   BUN 8 03/04/2024 1026   BUN 13 11/06/2023 1409   CREATININE 1.25 (H) 03/04/2024 1026   CREATININE 0.89 01/20/2015 1550      Component Value Date/Time   CALCIUM  9.1 03/04/2024 1026   ALKPHOS 54 03/04/2024 1026   AST 28 03/04/2024 1026   ALT 32 03/04/2024 1026   BILITOT 0.9 03/04/2024 1026     I looked at his blood smear.  I did not see anything that looked suspicious.  He had normochromic and normocytic red blood cells.  There is no inclusion bodies.  There is no target cells.  He had no schistocytes or spherocytes.  He had no rouleaux formation.  White blood cells appeared normal in morphology and maturation.  Platelets are adequate in number and size.  Impression and Plan: [name] is  a very nice 73 year old African-American male.  He has minimal anemia.  His red cell count has been dropping slowly.  Again I suspect this might be from a lower erythropoietin  level.  For right now, he does not qualify for ESA.  I think we just have  to watch this.  We can always give him ESA if necessary.  I would like to have him come back to see me in about 2 months.  I think this would be reasonable.  I do not see that he needs to have a bone marrow biopsy done right now.  I just think this would be very low yield.  As always, it is fun to talk to he and his family.   Maude JONELLE Crease, MD 12/9/20251:42 PM

## 2024-03-10 ENCOUNTER — Other Ambulatory Visit: Payer: Self-pay | Admitting: Family Medicine

## 2024-03-10 DIAGNOSIS — I10 Essential (primary) hypertension: Secondary | ICD-10-CM

## 2024-03-23 ENCOUNTER — Other Ambulatory Visit: Payer: Self-pay | Admitting: Student

## 2024-03-23 ENCOUNTER — Other Ambulatory Visit: Payer: Self-pay | Admitting: Family Medicine

## 2024-03-23 DIAGNOSIS — E118 Type 2 diabetes mellitus with unspecified complications: Secondary | ICD-10-CM

## 2024-03-23 DIAGNOSIS — I10 Essential (primary) hypertension: Secondary | ICD-10-CM

## 2024-04-09 ENCOUNTER — Ambulatory Visit

## 2024-05-05 ENCOUNTER — Inpatient Hospital Stay

## 2024-05-05 ENCOUNTER — Inpatient Hospital Stay: Admitting: Medical Oncology

## 2024-08-28 ENCOUNTER — Encounter
